# Patient Record
Sex: Male | Born: 1980
Health system: Southern US, Community
[De-identification: ages and names within clinical notes are randomized; demographics above are authoritative.]

## PROBLEM LIST (undated history)

## (undated) DIAGNOSIS — K5792 Diverticulitis of intestine, part unspecified, without perforation or abscess without bleeding: Secondary | ICD-10-CM

## (undated) DIAGNOSIS — I2510 Atherosclerotic heart disease of native coronary artery without angina pectoris: Secondary | ICD-10-CM

## (undated) DIAGNOSIS — M109 Gout, unspecified: Secondary | ICD-10-CM

## (undated) DIAGNOSIS — I219 Acute myocardial infarction, unspecified: Secondary | ICD-10-CM

## (undated) HISTORY — DX: Diverticulitis of intestine, part unspecified, without perforation or abscess without bleeding: K57.92

## (undated) HISTORY — PX: TONSILLECTOMY: SUR1361

## (undated) HISTORY — PX: NO PAST SURGERIES: SHX2092

---

## 2000-08-26 ENCOUNTER — Encounter: Payer: Self-pay | Admitting: Emergency Medicine

## 2000-08-26 ENCOUNTER — Emergency Department (HOSPITAL_COMMUNITY): Admission: EM | Admit: 2000-08-26 | Discharge: 2000-08-26 | Payer: Self-pay | Admitting: Emergency Medicine

## 2000-09-27 ENCOUNTER — Encounter: Payer: Self-pay | Admitting: Emergency Medicine

## 2000-09-27 ENCOUNTER — Emergency Department (HOSPITAL_COMMUNITY): Admission: EM | Admit: 2000-09-27 | Discharge: 2000-09-28 | Payer: Self-pay | Admitting: Emergency Medicine

## 2001-06-24 ENCOUNTER — Emergency Department (HOSPITAL_COMMUNITY): Admission: EM | Admit: 2001-06-24 | Discharge: 2001-06-24 | Payer: Self-pay | Admitting: Emergency Medicine

## 2001-11-21 ENCOUNTER — Encounter: Payer: Self-pay | Admitting: *Deleted

## 2001-11-21 ENCOUNTER — Emergency Department (HOSPITAL_COMMUNITY): Admission: EM | Admit: 2001-11-21 | Discharge: 2001-11-21 | Payer: Self-pay | Admitting: *Deleted

## 2005-08-22 ENCOUNTER — Encounter: Admission: RE | Admit: 2005-08-22 | Discharge: 2005-08-22 | Payer: Self-pay | Admitting: Occupational Medicine

## 2006-01-26 ENCOUNTER — Emergency Department (HOSPITAL_COMMUNITY): Admission: EM | Admit: 2006-01-26 | Discharge: 2006-01-26 | Payer: Self-pay | Admitting: Emergency Medicine

## 2006-02-28 ENCOUNTER — Emergency Department (HOSPITAL_COMMUNITY): Admission: EM | Admit: 2006-02-28 | Discharge: 2006-02-28 | Payer: Self-pay | Admitting: Emergency Medicine

## 2006-04-06 ENCOUNTER — Emergency Department (HOSPITAL_COMMUNITY): Admission: EM | Admit: 2006-04-06 | Discharge: 2006-04-06 | Payer: Self-pay | Admitting: Emergency Medicine

## 2006-06-05 ENCOUNTER — Emergency Department (HOSPITAL_COMMUNITY): Admission: EM | Admit: 2006-06-05 | Discharge: 2006-06-05 | Payer: Self-pay | Admitting: Emergency Medicine

## 2008-04-30 ENCOUNTER — Emergency Department (HOSPITAL_COMMUNITY): Admission: EM | Admit: 2008-04-30 | Discharge: 2008-04-30 | Payer: Self-pay | Admitting: Emergency Medicine

## 2010-02-28 ENCOUNTER — Emergency Department (HOSPITAL_COMMUNITY)
Admission: EM | Admit: 2010-02-28 | Discharge: 2010-03-02 | Disposition: A | Discharge status: Home / Self Care | Payer: Self-pay | Attending: Emergency Medicine | Admitting: Emergency Medicine

## 2010-02-28 ENCOUNTER — Emergency Department (HOSPITAL_COMMUNITY)
Admission: EM | Admit: 2010-02-28 | Discharge: 2010-02-28 | Payer: Self-pay | Source: Home / Self Care | Admitting: Emergency Medicine

## 2010-02-28 DIAGNOSIS — R0789 Other chest pain: Secondary | ICD-10-CM

## 2010-02-28 DIAGNOSIS — R079 Chest pain, unspecified: Secondary | ICD-10-CM | POA: Insufficient documentation

## 2010-02-28 DIAGNOSIS — R071 Chest pain on breathing: Secondary | ICD-10-CM | POA: Insufficient documentation

## 2010-02-28 LAB — DIFFERENTIAL
Basophils Absolute: 0 10*3/uL (ref 0.0–0.1)
Basophils Relative: 0 % (ref 0–1)
Eosinophils Absolute: 0 10*3/uL (ref 0.0–0.7)
Eosinophils Relative: 1 % (ref 0–5)
Lymphocytes Relative: 32 % (ref 12–46)
Lymphs Abs: 2.5 10*3/uL (ref 0.7–4.0)
Monocytes Absolute: 0.8 10*3/uL (ref 0.1–1.0)
Monocytes Relative: 11 % (ref 3–12)
Neutro Abs: 4.5 10*3/uL (ref 1.7–7.7)
Neutrophils Relative %: 57 % (ref 43–77)

## 2010-02-28 LAB — POCT CARDIAC MARKERS
CKMB, poc: 1.9 ng/mL (ref 1.0–8.0)
CKMB, poc: 1.7 ng/mL (ref 1.0–8.0)
Troponin i, poc: <0.05 ng/mL (ref 0.00–0.09)
Troponin i, poc: <0.05 ng/mL (ref 0.00–0.09)

## 2010-02-28 LAB — BASIC METABOLIC PANEL
BUN: 13 mg/dL (ref 6–23)
Calcium: 9.5 mg/dL (ref 8.4–10.5)
Chloride: 102 mEq/L (ref 96–112)
GFR calc Af Amer: >60 mL/min (ref >60)
Glucose, Bld: 109 mg/dL — ABNORMAL HIGH (ref 70–99)

## 2010-02-28 LAB — CBC
Hemoglobin: 13 g/dL (ref 13.0–17.0)
MCH: 26.4 pg (ref 26.0–34.0)
MCV: 81.9 fL (ref 78.0–100.0)
RBC: 4.93 MIL/uL (ref 4.22–5.81)

## 2010-03-01 ENCOUNTER — Emergency Department (HOSPITAL_COMMUNITY): Admit: 2010-03-01 | Discharge: 2010-03-01 | Disposition: A | Discharge status: Home / Self Care | Payer: Self-pay

## 2010-03-01 LAB — POCT CARDIAC MARKERS
CKMB, poc: 1.3 ng/mL (ref 1.0–8.0)
Myoglobin, poc: 131 ng/mL (ref 12–200)
Troponin i, poc: <0.05 ng/mL (ref 0.00–0.09)

## 2010-06-16 NOTE — Procedures (Signed)
[facility]  Patient:    Leonard Pierce, Leonard Pierce Visit Number: [id number] MRN: [id number]          Service Type: EMS Location: ED Attending Physician:  Herbert Seta Dictated by:   Kari Baars, M.D. Proc. Date: 06/24/01 Admit Date:  06/24/2001 Discharge Date: 06/24/2001                            EKG Interpretations  TIME:  1740, Jun 24, 2001  The rhythm is sinus rhythm with a rate at 40s.  There is left ventricular hypertrophy.  IMPRESSION:  Abnormal electrocardiogram. Dictated by:   Kari Baars, M.D. Attending Physician:  Herbert Seta DD:  08/19/01 TD:  08/23/01 Job: [id number]

## 2010-09-22 ENCOUNTER — Emergency Department: Payer: Self-pay | Admitting: Emergency Medicine

## 2010-09-24 ENCOUNTER — Emergency Department: Payer: Self-pay | Admitting: Internal Medicine

## 2010-09-28 ENCOUNTER — Emergency Department: Payer: Self-pay | Admitting: Emergency Medicine

## 2011-07-20 DIAGNOSIS — R079 Chest pain, unspecified: Secondary | ICD-10-CM

## 2012-02-28 DIAGNOSIS — R1084 Generalized abdominal pain: Secondary | ICD-10-CM | POA: Insufficient documentation

## 2012-02-28 DIAGNOSIS — R05 Cough: Secondary | ICD-10-CM | POA: Insufficient documentation

## 2012-02-28 DIAGNOSIS — R059 Cough, unspecified: Secondary | ICD-10-CM | POA: Insufficient documentation

## 2012-02-28 DIAGNOSIS — R197 Diarrhea, unspecified: Secondary | ICD-10-CM | POA: Insufficient documentation

## 2012-02-28 DIAGNOSIS — K5289 Other specified noninfective gastroenteritis and colitis: Secondary | ICD-10-CM | POA: Insufficient documentation

## 2012-02-29 ENCOUNTER — Encounter (HOSPITAL_COMMUNITY): Payer: Self-pay | Admitting: Emergency Medicine

## 2012-02-29 ENCOUNTER — Emergency Department (HOSPITAL_COMMUNITY)
Admission: EM | Admit: 2012-02-29 | Discharge: 2012-02-29 | Disposition: A | Discharge status: Home / Self Care | Payer: PRIVATE HEALTH INSURANCE | Attending: Emergency Medicine | Admitting: Emergency Medicine

## 2012-02-29 DIAGNOSIS — K529 Noninfective gastroenteritis and colitis, unspecified: Secondary | ICD-10-CM

## 2012-02-29 LAB — COMPREHENSIVE METABOLIC PANEL
Alkaline Phosphatase: 65 U/L (ref 39–117)
BUN: 14 mg/dL (ref 6–23)
Calcium: 9.9 mg/dL (ref 8.4–10.5)
Creatinine, Ser: 1.02 mg/dL (ref 0.50–1.35)
GFR calc Af Amer: >90 mL/min (ref >90)
Glucose, Bld: 99 mg/dL (ref 70–99)
Total Protein: 8.3 g/dL (ref 6.0–8.3)

## 2012-02-29 LAB — CBC WITH DIFFERENTIAL/PLATELET
Eosinophils Absolute: 0.1 10*3/uL (ref 0.0–0.7)
Eosinophils Relative: 1 % (ref 0–5)
Hemoglobin: 14.1 g/dL (ref 13.0–17.0)
Lymphs Abs: 2.2 10*3/uL (ref 0.7–4.0)
MCH: 27 pg (ref 26.0–34.0)
MCV: 82 fL (ref 78.0–100.0)
Monocytes Absolute: 0.9 10*3/uL (ref 0.1–1.0)
Monocytes Relative: 12 % (ref 3–12)
RBC: 5.22 MIL/uL (ref 4.22–5.81)

## 2012-02-29 LAB — LIPASE, BLOOD: Lipase: 36 U/L (ref 11–59)

## 2012-02-29 MED ORDER — ONDANSETRON HCL 4 MG/2ML IJ SOLN
4.0000 mg | Freq: Once | INTRAMUSCULAR | Status: AC
  Administered 2012-02-29: 4 mg via INTRAVENOUS
  Filled 2012-02-29: qty 2

## 2012-02-29 MED ORDER — SODIUM CHLORIDE 0.9 % IV BOLUS (SEPSIS)
1000.0000 mL | Freq: Once | INTRAVENOUS | Status: AC
  Administered 2012-02-29: 1000 mL via INTRAVENOUS

## 2012-02-29 MED ORDER — ONDANSETRON HCL 4 MG PO TABS: 4.0000 mg | ORAL_TABLET | Freq: Three times a day (TID) | ORAL | Status: DC | PRN

## 2012-02-29 NOTE — ED Notes (Signed)
Patient reporting emesis and diarrhea since 1900, started after eating dinner. Also complaining of dry cough x 1 week.

## 2012-02-29 NOTE — ED Provider Notes (Signed)
History     CSN: [id number]  Arrival date & time 02/28/12  2353   First MD Initiated Contact with Patient 02/29/12 0015      Chief Complaint  Patient presents with  . Emesis  . Diarrhea  . Cough    (Consider location/radiation/quality/duration/timing/severity/associated sxs/prior treatment) HPI Pt reports about 24hrs of multiple episodes of non-bloody non-bilious emesis and watery diarrhea associated with moderate diffuse abdominal cramps. He denies any fever but reports a dry cough for a few days prior to onset of vomiting.   History reviewed. No pertinent past medical history.  History reviewed. No pertinent past surgical history.  History reviewed. No pertinent family history.  History  Substance Use Topics  . Smoking status: Never Smoker   . Smokeless tobacco: Not on file  . Alcohol Use: No      Review of Systems All other systems reviewed and are negative except as noted in HPI.   Allergies  Review of patient's allergies indicates no known allergies.  Home Medications  No current outpatient prescriptions on file.  BP 144/82  Pulse 83  Temp 98.4 F (36.9 C) (Oral)  Resp 18  Ht 5\' 8"  (1.727 m)  Wt 320 lb (145.151 kg)  BMI 48.66 kg/m2  SpO2 97%  Physical Exam  Nursing note and vitals reviewed. Constitutional: He is oriented to person, place, and time. He appears well-developed and well-nourished.  HENT:  Head: Normocephalic and atraumatic.  Eyes: EOM are normal. Pupils are equal, round, and reactive to light.  Neck: Normal range of motion. Neck supple.  Cardiovascular: Normal rate, normal heart sounds and intact distal pulses.   Pulmonary/Chest: Effort normal and breath sounds normal.  Abdominal: Bowel sounds are normal. He exhibits no distension. There is no tenderness.  Musculoskeletal: Normal range of motion. He exhibits no edema and no tenderness.  Neurological: He is alert and oriented to person, place, and time. He has normal strength. No  cranial nerve deficit or sensory deficit.  Skin: Skin is warm and dry. No rash noted.  Psychiatric: He has a normal mood and affect.    ED Course  Procedures (including critical care time)  Labs Reviewed  COMPREHENSIVE METABOLIC PANEL - Abnormal; Notable for the following:    Total Bilirubin 0.2 (*)     All other components within normal limits  CBC WITH DIFFERENTIAL  LIPASE, BLOOD   No results found.   No diagnosis found.    MDM  Labs unremarkable. Pt feeling better and tolerating PO fluid. Likely viral gastroenteritis. Advised clear liquids and advance as tolerated.         Davonna Ertl B. Bernette Mayers, MD 02/29/12 [phone]

## 2012-05-09 ENCOUNTER — Emergency Department (HOSPITAL_COMMUNITY)
Admission: EM | Admit: 2012-05-09 | Discharge: 2012-05-09 | Disposition: A | Discharge status: Home / Self Care | Payer: Self-pay | Attending: Emergency Medicine | Admitting: Emergency Medicine

## 2012-05-09 ENCOUNTER — Emergency Department (HOSPITAL_COMMUNITY): Payer: Self-pay

## 2012-05-09 ENCOUNTER — Encounter (HOSPITAL_COMMUNITY): Payer: Self-pay | Admitting: *Deleted

## 2012-05-09 DIAGNOSIS — W19XXXA Unspecified fall, initial encounter: Secondary | ICD-10-CM

## 2012-05-09 DIAGNOSIS — W1809XA Striking against other object with subsequent fall, initial encounter: Secondary | ICD-10-CM | POA: Insufficient documentation

## 2012-05-09 DIAGNOSIS — S301XXA Contusion of abdominal wall, initial encounter: Secondary | ICD-10-CM | POA: Insufficient documentation

## 2012-05-09 DIAGNOSIS — Y9389 Activity, other specified: Secondary | ICD-10-CM | POA: Insufficient documentation

## 2012-05-09 DIAGNOSIS — Y9289 Other specified places as the place of occurrence of the external cause: Secondary | ICD-10-CM | POA: Insufficient documentation

## 2012-05-09 LAB — POCT I-STAT, CHEM 8
Glucose, Bld: 106 mg/dL — ABNORMAL HIGH (ref 70–99)
HCT: 42 % (ref 39.0–52.0)
Hemoglobin: 14.3 g/dL (ref 13.0–17.0)
Potassium: 3.7 mEq/L (ref 3.5–5.1)

## 2012-05-09 LAB — URINALYSIS, ROUTINE W REFLEX MICROSCOPIC
Bilirubin Urine: NEGATIVE
Nitrite: NEGATIVE
Specific Gravity, Urine: 1.025 (ref 1.005–1.030)
Urobilinogen, UA: 0.2 mg/dL (ref 0.0–1.0)

## 2012-05-09 MED ORDER — SODIUM CHLORIDE 0.9 % IV SOLN
INTRAVENOUS | Status: DC
  Administered 2012-05-09: 1000 mL via INTRAVENOUS

## 2012-05-09 MED ORDER — CYCLOBENZAPRINE HCL 5 MG PO TABS: 5.0000 mg | ORAL_TABLET | Freq: Three times a day (TID) | ORAL | Status: DC | PRN

## 2012-05-09 MED ORDER — IOHEXOL 300 MG/ML  SOLN
120.0000 mL | Freq: Once | INTRAMUSCULAR | Status: AC | PRN
  Administered 2012-05-09: 120 mL via INTRAVENOUS

## 2012-05-09 MED ORDER — MORPHINE SULFATE 4 MG/ML IJ SOLN
4.0000 mg | Freq: Once | INTRAMUSCULAR | Status: AC
  Administered 2012-05-09: 4 mg via INTRAVENOUS
  Filled 2012-05-09: qty 1

## 2012-05-09 MED ORDER — HYDROCODONE-ACETAMINOPHEN 5-325 MG PO TABS: ORAL_TABLET | ORAL | Status: DC

## 2012-05-09 MED ORDER — ONDANSETRON HCL 4 MG/2ML IJ SOLN
4.0000 mg | Freq: Once | INTRAMUSCULAR | Status: AC
  Administered 2012-05-09: 4 mg via INTRAVENOUS
  Filled 2012-05-09: qty 2

## 2012-05-09 NOTE — ED Notes (Signed)
Pt states was moving furniture yesterday & now having back pain in his lower right back. Denies any urinary problems. Hurts more w/ certain movements.

## 2012-05-09 NOTE — ED Notes (Signed)
Pt states was moving furniture yesterday & now having lower back pain.

## 2012-05-09 NOTE — ED Provider Notes (Signed)
History     CSN: [id number]  Arrival date & time 05/09/12  0543   First MD Initiated Contact with Patient 05/09/12 0557      Chief Complaint  Patient presents with  . Back Pain    (Consider location/radiation/quality/duration/timing/severity/associated sxs/prior treatment) HPI  Patient states he was helping his employer move yesterday. Patient had a large dresser on a dolly and was taking it up some stairs when the patient slipped and fell backwards onto cement steps and relates the dresser on the dolly fell and hit him on his right side. He states he had immediate stinging pain that got a little bit better. However he states he was was awakened from sleep at 2:30 this morning with worsening pain. In his right abdomen/back.  He states if he moves he gets a sharp "catching" pain however he has a constant achy pain also that is worse than he states bending his right leg makes the pain feel better. He states any movement or bending makes the pain feel worse. He denies any gross hematuria, nausea, vomiting or pleuritic pain. The pain does not radiate into his extremities. He states he's never had this before.  History reviewed. No pertinent past medical history.  History reviewed. No pertinent past surgical history.  No family history on file.  History  Substance Use Topics  . Smoking status: Never Smoker   . Smokeless tobacco: Not on file  . Alcohol Use: No   Employed as heavy Arboriculturist   Review of Systems  All other systems reviewed and are negative.    Allergies  Review of patient's allergies indicates no known allergies.  Home Medications   Current Outpatient Rx  Name  Route  Sig  Dispense  Refill  . ondansetron (ZOFRAN) 4 MG tablet   Oral   Take 1 tablet (4 mg total) by mouth every 8 (eight) hours as needed for nausea.   12 tablet   0     BP 129/76  Pulse 78  Temp(Src) 98.1 F (36.7 C) (Oral)  Resp 20  Ht 5\' 8"  (1.727 m)  Wt 320 lb (145.151 kg)   BMI 48.67 kg/m2  SpO2 95%  Vital signs normal    Physical Exam  Nursing note and vitals reviewed. Constitutional: He is oriented to person, place, and time. He appears well-developed and well-nourished.  Non-toxic appearance. He does not appear ill. No distress.  HENT:  Head: Normocephalic and atraumatic.  Right Ear: External ear normal.  Left Ear: External ear normal.  Nose: Nose normal. No mucosal edema or rhinorrhea.  Mouth/Throat: Oropharynx is clear and moist and mucous membranes are normal. No dental abscesses or edematous.  Eyes: Conjunctivae and EOM are normal. Pupils are equal, round, and reactive to light.  Neck: Normal range of motion and full passive range of motion without pain. Neck supple.  Cardiovascular: Normal rate, regular rhythm and normal heart sounds.  Exam reveals no gallop and no friction rub.   No murmur heard. Pulmonary/Chest: Effort normal and breath sounds normal. No respiratory distress. He has no wheezes. He has no rhonchi. He has no rales. He exhibits no tenderness and no crepitus.  Abdominal: Soft. Normal appearance and bowel sounds are normal. He exhibits no distension. There is no tenderness. There is no rebound and no guarding.  Musculoskeletal: Normal range of motion. He exhibits no edema and no tenderness.  Moves all extremities well.  Has mild tenderness in his lumbar spine midline to palpation,but is very tender in  his right flank area. No bruising or abrasion noted. Appears painful when changes position.   Neurological: He is alert and oriented to person, place, and time. He has normal strength. No cranial nerve deficit.  Skin: Skin is warm, dry and intact. No rash noted. No erythema. No pallor.  Psychiatric: He has a normal mood and affect. His speech is normal and behavior is normal. His mood appears not anxious.    ED Course  Procedures (including critical care time)  Medications  0.9 %  sodium chloride infusion (1,000 mLs Intravenous New  Bag/Given 05/09/12 0625)  morphine 4 MG/ML injection 4 mg (4 mg Intravenous Given 05/09/12 0629)  ondansetron (ZOFRAN) injection 4 mg (4 mg Intravenous Given 05/09/12 0626)  iohexol (OMNIPAQUE) 300 MG/ML solution 120 mL (120 mLs Intravenous Contrast Given 05/09/12 0710)    Results for orders placed during the hospital encounter of 05/09/12  URINALYSIS, ROUTINE W REFLEX MICROSCOPIC      Result Value Range   Color, Urine YELLOW  YELLOW   APPearance CLEAR  CLEAR   Specific Gravity, Urine 1.025  1.005 - 1.030   pH 6.0  5.0 - 8.0   Glucose, UA NEGATIVE  NEGATIVE mg/dL   Hgb urine dipstick NEGATIVE  NEGATIVE   Bilirubin Urine NEGATIVE  NEGATIVE   Ketones, ur NEGATIVE  NEGATIVE mg/dL   Protein, ur NEGATIVE  NEGATIVE mg/dL   Urobilinogen, UA 0.2  0.0 - 1.0 mg/dL   Nitrite NEGATIVE  NEGATIVE   Leukocytes, UA NEGATIVE  NEGATIVE  POCT I-STAT, CHEM 8      Result Value Range   Sodium 140  135 - 145 mEq/L   Potassium 3.7  3.5 - 5.1 mEq/L   Chloride 104  96 - 112 mEq/L   BUN 10  6 - 23 mg/dL   Creatinine, Ser 4.09  0.50 - 1.35 mg/dL   Glucose, Bld 811 (*) 70 - 99 mg/dL   Calcium, Ion 9.14  1.12 - 1.23 mmol/L   TCO2 28  0 - 100 mmol/L   Hemoglobin 14.3  13.0 - 17.0 g/dL   HCT 78.2  95.6 - 21.3 %   Laboratory interpretation all normal  Ct Abdomen Pelvis W Contrast  05/09/2012  *RADIOLOGY REPORT*  Clinical Data: Back pain after a fall.  Right flank pain.  CT ABDOMEN AND PELVIS WITH CONTRAST  Technique:  Multidetector CT imaging of the abdomen and pelvis was performed following the standard protocol during bolus administration of intravenous contrast.  Contrast: OMNIPAQUE IOHEXOL 300 MG/ML  SOLN  Comparison: None.  Findings: Lung bases show minimal dependent atelectasis.  Heart size at the upper limits of normal.  No pericardial or pleural effusion.  Liver is decreased in attenuation diffusely.  Liver, gallbladder, adrenal glands, kidneys, spleen, pancreas, stomach and bowel are otherwise  unremarkable.  There is haziness in the small bowel mesentery with nodules measuring up to 14 mm (image 44).  No pathologically enlarged lymph nodes.  Circumaortic left renal vein. No free fluid.  No worrisome lytic or sclerotic lesions.  No fracture.  A well- circumscribed sclerotic lesion in the right eighth lateral rib has a benign appearance.  IMPRESSION:  1.  No evidence of acute trauma. 2.  Hepatic steatosis. 3.  Haziness and nodularity in the small bowel mesentery is nonspecific.   Original Report Authenticated By: Leanna Battles, M.D.        Results for orders placed during the hospital encounter of 02/29/12  CBC WITH DIFFERENTIAL  Result Value Range   WBC 7.2  4.0 - 10.5 K/uL   RBC 5.22  4.22 - 5.81 MIL/uL   Hemoglobin 14.1  13.0 - 17.0 g/dL   HCT 16.1  09.6 - 04.5 %   MCV 82.0  78.0 - 100.0 fL   MCH 27.0  26.0 - 34.0 pg   MCHC 32.9  30.0 - 36.0 g/dL   RDW 40.9  81.1 - 91.4 %   Platelets 249  150 - 400 K/uL   Neutrophils Relative 56  43 - 77 %   Neutro Abs 4.0  1.7 - 7.7 K/uL   Lymphocytes Relative 31  12 - 46 %   Lymphs Abs 2.2  0.7 - 4.0 K/uL   Monocytes Relative 12  3 - 12 %   Monocytes Absolute 0.9  0.1 - 1.0 K/uL   Eosinophils Relative 1  0 - 5 %   Eosinophils Absolute 0.1  0.0 - 0.7 K/uL   Basophils Relative 0  0 - 1 %   Basophils Absolute 0.0  0.0 - 0.1 K/uL  COMPREHENSIVE METABOLIC PANEL      Result Value Range   Sodium 138  135 - 145 mEq/L   Potassium 3.7  3.5 - 5.1 mEq/L   Chloride 101  96 - 112 mEq/L   CO2 28  19 - 32 mEq/L   Glucose, Bld 99  70 - 99 mg/dL   BUN 14  6 - 23 mg/dL   Creatinine, Ser 7.82  0.50 - 1.35 mg/dL   Calcium 9.9  8.4 - 95.6 mg/dL   Total Protein 8.3  6.0 - 8.3 g/dL   Albumin 4.0  3.5 - 5.2 g/dL   AST 21  0 - 37 U/L   ALT 22  0 - 53 U/L   Alkaline Phosphatase 65  39 - 117 U/L   Total Bilirubin 0.2 (*) 0.3 - 1.2 mg/dL   GFR calc non Af Amer >90  >90 mL/min   GFR calc Af Amer >90  >90 mL/min  LIPASE, BLOOD      Result Value  Range   Lipase 36  11 - 59 U/L      1. Fall, initial encounter   2. Contusion, flank, initial encounter     New Prescriptions   CYCLOBENZAPRINE (FLEXERIL) 5 MG TABLET    Take 1 tablet (5 mg total) by mouth 3 (three) times daily as needed (muscle pain).   HYDROCODONE-ACETAMINOPHEN (NORCO/VICODIN) 5-325 MG PER TABLET    Take 1 or 2 po Q 6hrs for pain    Plan discharge  Devoria Albe, MD, FACEP   MDM          Ward Givens, MD 05/09/12 [phone]

## 2012-05-14 ENCOUNTER — Ambulatory Visit: Payer: Self-pay | Admitting: Family Medicine

## 2012-05-14 LAB — RAPID INFLUENZA A&B ANTIGENS

## 2012-06-17 ENCOUNTER — Encounter (HOSPITAL_COMMUNITY): Payer: Self-pay | Admitting: *Deleted

## 2012-06-17 ENCOUNTER — Emergency Department (HOSPITAL_COMMUNITY)
Admission: EM | Admit: 2012-06-17 | Discharge: 2012-06-17 | Disposition: A | Discharge status: Home / Self Care | Payer: Self-pay | Attending: Emergency Medicine | Admitting: Emergency Medicine

## 2012-06-17 DIAGNOSIS — Y9364 Activity, baseball: Secondary | ICD-10-CM | POA: Insufficient documentation

## 2012-06-17 DIAGNOSIS — X500XXA Overexertion from strenuous movement or load, initial encounter: Secondary | ICD-10-CM | POA: Insufficient documentation

## 2012-06-17 DIAGNOSIS — Y9239 Other specified sports and athletic area as the place of occurrence of the external cause: Secondary | ICD-10-CM | POA: Insufficient documentation

## 2012-06-17 DIAGNOSIS — Y929 Unspecified place or not applicable: Secondary | ICD-10-CM | POA: Insufficient documentation

## 2012-06-17 DIAGNOSIS — S335XXA Sprain of ligaments of lumbar spine, initial encounter: Secondary | ICD-10-CM | POA: Insufficient documentation

## 2012-06-17 DIAGNOSIS — S39012A Strain of muscle, fascia and tendon of lower back, initial encounter: Secondary | ICD-10-CM

## 2012-06-17 MED ORDER — CYCLOBENZAPRINE HCL 10 MG PO TABS: 10.0000 mg | ORAL_TABLET | Freq: Two times a day (BID) | ORAL | Status: DC | PRN

## 2012-06-17 MED ORDER — NAPROXEN 500 MG PO TABS: 500.0000 mg | ORAL_TABLET | Freq: Two times a day (BID) | ORAL | Status: DC

## 2012-06-17 MED ORDER — TRAMADOL HCL 50 MG PO TABS: 50.0000 mg | ORAL_TABLET | Freq: Four times a day (QID) | ORAL | Status: DC | PRN

## 2012-06-17 NOTE — ED Provider Notes (Signed)
History     This chart was scribed for Gilda Crease, *, MD by Smitty Pluck, ED Scribe. The patient was seen in room APA08/APA08 and the patient's care was started at 9:57 AM.   CSN: [id number]  Arrival date & time 06/17/12  0946     No chief complaint on file.   The history is provided by the patient and medical records. No language interpreter was used.   HPI Comments: Leonard Pierce is a 32 y.o. male who presents to the Emergency Department complaining of constant, moderate right lower back pain radiating to right thigh onset 1 day ago. He states it suddenly started while playing basketball.  Pt denies numbness, urinary/bowel incontinence, fever, chills, nausea, vomiting, diarrhea, weakness, cough, SOB and any other pain.   No past medical history on file.  No past surgical history on file.  No family history on file.  History  Substance Use Topics  . Smoking status: Never Smoker   . Smokeless tobacco: Not on file  . Alcohol Use: No      Review of Systems  Genitourinary: Negative for dysuria.  Musculoskeletal: Positive for back pain.  All other systems reviewed and are negative.    Allergies  Review of patient's allergies indicates no known allergies.  Home Medications   Current Outpatient Rx  Name  Route  Sig  Dispense  Refill  . cyclobenzaprine (FLEXERIL) 5 MG tablet   Oral   Take 1 tablet (5 mg total) by mouth 3 (three) times daily as needed (muscle pain).   30 tablet   0   . HYDROcodone-acetaminophen (NORCO/VICODIN) 5-325 MG per tablet      Take 1 or 2 po Q 6hrs for pain   15 tablet   0   . ondansetron (ZOFRAN) 4 MG tablet   Oral   Take 1 tablet (4 mg total) by mouth every 8 (eight) hours as needed for nausea.   12 tablet   0     BP 134/89  Pulse 58  Temp(Src) 97.5 F (36.4 C) (Oral)  Resp 16  SpO2 97%  Physical Exam  Nursing note and vitals reviewed. Constitutional: He is oriented to person, place, and time. He appears  well-developed and well-nourished. No distress.  HENT:  Head: Normocephalic and atraumatic.  Right Ear: Hearing normal.  Left Ear: Hearing normal.  Nose: Nose normal.  Mouth/Throat: Oropharynx is clear and moist and mucous membranes are normal.  Eyes: Conjunctivae and EOM are normal. Pupils are equal, round, and reactive to light.  Neck: Normal range of motion. Neck supple.  Cardiovascular: Regular rhythm, S1 normal and S2 normal.  Exam reveals no gallop and no friction rub.   No murmur heard. Pulmonary/Chest: Effort normal and breath sounds normal. No respiratory distress. He exhibits no tenderness.  Abdominal: Soft. Normal appearance and bowel sounds are normal. There is no hepatosplenomegaly. There is no tenderness. There is no rebound, no guarding, no tenderness at McBurney's point and negative Murphy's sign. No hernia.  Musculoskeletal: Normal range of motion.       Lumbar back: He exhibits normal range of motion and no bony tenderness.       Arms: right para-lumbar tenderness No midline tenderness  Neurological: He is alert and oriented to person, place, and time. He has normal strength. No cranial nerve deficit or sensory deficit. Coordination normal. GCS eye subscore is 4. GCS verbal subscore is 5. GCS motor subscore is 6.  Reflex Scores:  Patellar reflexes are 2+ on the right side and 2+ on the left side. Skin: Skin is warm, dry and intact. No rash noted. No cyanosis.  Psychiatric: He has a normal mood and affect. His speech is normal and behavior is normal. Thought content normal.    ED Course  Procedures (including critical care time) DIAGNOSTIC STUDIES: Oxygen Saturation is 97% on room air, normal by my interpretation.    COORDINATION OF CARE: 9:58 AM Discussed ED treatment with pt and pt agrees.     Labs Reviewed - No data to display No results found.   Diagnosis: Lumbar strain    MDM  Patient presents to the ER with musculoskeletal back pain. Examination  reveals back tenderness without any associated neurologic findings. Patient's strength, sensation and reflexes were normal. As such, patient did not require any imaging or further studies. Patient was treated with analgesia.  I personally performed the services described in this documentation, which was scribed in my presence. The recorded information has been reviewed and is accurate.      Gilda Crease, MD 06/17/12 1010

## 2012-06-17 NOTE — ED Notes (Signed)
Pt states right mid back pain after swinging the bat during baseball yesterday. States he was unable to sleep or go in to work last night.

## 2012-12-15 ENCOUNTER — Emergency Department (HOSPITAL_COMMUNITY): Payer: Self-pay

## 2012-12-15 ENCOUNTER — Emergency Department (HOSPITAL_COMMUNITY)
Admission: EM | Admit: 2012-12-15 | Discharge: 2012-12-15 | Disposition: A | Discharge status: Home / Self Care | Payer: Self-pay | Attending: Emergency Medicine | Admitting: Emergency Medicine

## 2012-12-15 ENCOUNTER — Encounter (HOSPITAL_COMMUNITY): Payer: Self-pay | Admitting: Emergency Medicine

## 2012-12-15 DIAGNOSIS — M25519 Pain in unspecified shoulder: Secondary | ICD-10-CM | POA: Insufficient documentation

## 2012-12-15 DIAGNOSIS — R52 Pain, unspecified: Secondary | ICD-10-CM | POA: Insufficient documentation

## 2012-12-15 DIAGNOSIS — Z791 Long term (current) use of non-steroidal anti-inflammatories (NSAID): Secondary | ICD-10-CM | POA: Insufficient documentation

## 2012-12-15 DIAGNOSIS — IMO0002 Reserved for concepts with insufficient information to code with codable children: Secondary | ICD-10-CM | POA: Insufficient documentation

## 2012-12-15 DIAGNOSIS — G8929 Other chronic pain: Secondary | ICD-10-CM | POA: Insufficient documentation

## 2012-12-15 DIAGNOSIS — L210 Seborrhea capitis: Secondary | ICD-10-CM | POA: Insufficient documentation

## 2012-12-15 DIAGNOSIS — M25512 Pain in left shoulder: Secondary | ICD-10-CM

## 2012-12-15 DIAGNOSIS — Z79899 Other long term (current) drug therapy: Secondary | ICD-10-CM | POA: Insufficient documentation

## 2012-12-15 MED ORDER — TRIAMCINOLONE ACETONIDE 0.1 % EX CREA: 1.0000 "application " | TOPICAL_CREAM | Freq: Three times a day (TID) | CUTANEOUS | Status: DC

## 2012-12-15 MED ORDER — TRAMADOL HCL 50 MG PO TABS: 50.0000 mg | ORAL_TABLET | Freq: Four times a day (QID) | ORAL | Status: DC | PRN

## 2012-12-15 MED ORDER — DICLOFENAC SODIUM 75 MG PO TBEC: 75.0000 mg | DELAYED_RELEASE_TABLET | Freq: Two times a day (BID) | ORAL | Status: DC

## 2012-12-15 NOTE — ED Notes (Signed)
Pt states old shoulder injury to left should that has flared up. Also states rash to chest began 2 days ago. NAD

## 2012-12-15 NOTE — ED Notes (Signed)
Pain lt shoulder, "I think I hurt it playing highschool baseball. I tore up my rotator cuff".  Also rash to lt ant chest that itches. Alert, NAD.

## 2012-12-16 NOTE — ED Provider Notes (Signed)
CSN: [id number]     Arrival date & time 12/15/12  1130 History   First MD Initiated Contact with Patient 12/15/12 1136     Chief Complaint  Patient presents with  . Shoulder Pain  . Rash   (Consider location/radiation/quality/duration/timing/severity/associated sxs/prior Treatment) Patient is a 32 y.o. male presenting with shoulder pain and rash. The history is provided by the patient.  Shoulder Pain This is a recurrent problem. Episode onset: 3 days. The problem occurs constantly. The problem has been unchanged. Associated symptoms include arthralgias and a rash. Pertinent negatives include no abdominal pain, chest pain, chills, coughing, fever, headaches, joint swelling, nausea, neck pain, numbness, sore throat, vomiting or weakness. Exacerbated by: movement of the left arm. He has tried NSAIDs for the symptoms. The treatment provided moderate relief.  Rash Location:  Torso Torso rash location:  L chest and R chest Quality: itchiness and scaling   Quality: not blistering, not burning, not draining, not painful, not red and not swelling   Severity:  Mild Onset quality:  Gradual Duration:  2 days Timing:  Constant Progression:  Unchanged Chronicity:  New Context: not animal contact, not exposure to similar rash, not insect bite/sting, not medications, not new detergent/soap, not plant contact, not sick contacts and not sun exposure   Relieved by:  Nothing Worsened by:  Nothing tried Ineffective treatments:  None tried Associated symptoms: joint pain   Associated symptoms: no abdominal pain, no fever, no headaches, no induration, no nausea, no periorbital edema, no shortness of breath, no sore throat, no throat swelling, no tongue swelling, no URI, not vomiting and not wheezing     History reviewed. No pertinent past medical history. History reviewed. No pertinent past surgical history. No family history on file. History  Substance Use Topics  . Smoking status: Never Smoker   .  Smokeless tobacco: Not on file  . Alcohol Use: No    Review of Systems  Constitutional: Negative for fever, chills, activity change and appetite change.  HENT: Negative for facial swelling, sore throat and trouble swallowing.   Respiratory: Negative for cough, chest tightness, shortness of breath and wheezing.   Cardiovascular: Negative for chest pain.  Gastrointestinal: Negative for nausea, vomiting and abdominal pain.  Genitourinary: Negative for dysuria and difficulty urinating.  Musculoskeletal: Positive for arthralgias. Negative for joint swelling, neck pain and neck stiffness.       Left shoulder pain  Skin: Positive for rash. Negative for color change and wound.       Rash across upper chest  Neurological: Negative for dizziness, weakness, numbness and headaches.  All other systems reviewed and are negative.    Allergies  Review of patient's allergies indicates no known allergies.  Home Medications   Current Outpatient Rx  Name  Route  Sig  Dispense  Refill  . cyclobenzaprine (FLEXERIL) 10 MG tablet   Oral   Take 1 tablet (10 mg total) by mouth 2 (two) times daily as needed for muscle spasms.   20 tablet   0   . diclofenac (VOLTAREN) 75 MG EC tablet   Oral   Take 1 tablet (75 mg total) by mouth 2 (two) times daily. Take with food   14 tablet   0   . naproxen (NAPROSYN) 500 MG tablet   Oral   Take 1 tablet (500 mg total) by mouth 2 (two) times daily.   30 tablet   0   . traMADol (ULTRAM) 50 MG tablet   Oral   Take  1 tablet (50 mg total) by mouth every 6 (six) hours as needed for pain.   15 tablet   0   . traMADol (ULTRAM) 50 MG tablet   Oral   Take 1 tablet (50 mg total) by mouth every 6 (six) hours as needed.   15 tablet   0   . triamcinolone cream (KENALOG) 0.1 %   Topical   Apply 1 application topically 3 (three) times daily.   15 g   0    BP 154/82  Pulse 97  Temp(Src) 98.1 F (36.7 C)  Resp 19  Ht 5\' 10"  (1.778 m)  Wt 315 lb (142.883  kg)  BMI 45.20 kg/m2  SpO2 100% Physical Exam  Nursing note and vitals reviewed. Constitutional: He is oriented to person, place, and time. He appears well-developed and well-nourished. No distress.  HENT:  Head: Normocephalic and atraumatic.  Neck: Normal range of motion. Neck supple. No thyromegaly present.  Cardiovascular: Normal rate, regular rhythm, normal heart sounds and intact distal pulses.   No murmur heard. Pulmonary/Chest: Effort normal and breath sounds normal. No respiratory distress. He exhibits no tenderness.  Abdominal: Soft. He exhibits no distension. There is no tenderness. There is no rebound.  Musculoskeletal: He exhibits tenderness. He exhibits no edema.  ttp of the anterior left shoulder.  Pain with abduction of the left arm and rotation of the shoulder.  Radial pulse is brisk, distal sensation intact, CR< 2 sec. Grip strength is strong and symmetrical.   No abrasions, edema , erythema or step-off deformity of the joint.   Lymphadenopathy:    He has no cervical adenopathy.  Neurological: He is alert and oriented to person, place, and time. He has normal strength. No sensory deficit. He exhibits normal muscle tone. Coordination normal.  Skin: Skin is warm and dry. Rash noted.  Few macular , scaly hyperpigmented lesions to the upper chest.  No erythema, vesicles, pustules or edema.    ED Course  Procedures (including critical care time) Labs Review Labs Reviewed - No data to display Imaging Review Dg Shoulder Left  12/15/2012   CLINICAL DATA:  Pain  EXAM: LEFT SHOULDER - 2+ VIEW  COMPARISON:  None.  FINDINGS: There is no evidence of fracture or dislocation. There is no evidence of arthropathy or other focal bone abnormality. Soft tissues are unremarkable.  IMPRESSION: Negative.   Electronically Signed   By: Salome Holmes M.D.   On: 12/15/2012 12:35    EKG Interpretation   None       MDM   1. Shoulder pain, left   2. Pityriasis    Patient with left  shoulder pain that's acute on chronic.  No concerning sx's for septic joint.  NV intact. C spine is NT.  No focal neuro deficits.  I will provide symptomatic tx with ultram#15, diclofenac, and kenalog cream for itching.  Rash appears c/w pityriasis.    VSS.  Pt appears stable for d/c    Mackinley Kiehn L. Trisha Mangle, PA-C 12/16/12 2108

## 2012-12-18 NOTE — ED Provider Notes (Signed)
Medical screening examination/treatment/procedure(s) were performed by non-physician practitioner and as supervising physician I was immediately available for consultation/collaboration.  EKG Interpretation   None         Christophr Calix W Lauran Romanski, MD 12/18/12 1603 

## 2013-01-29 ENCOUNTER — Encounter (HOSPITAL_COMMUNITY): Payer: Self-pay | Admitting: Emergency Medicine

## 2013-01-29 ENCOUNTER — Emergency Department (HOSPITAL_COMMUNITY): Payer: Self-pay

## 2013-01-29 ENCOUNTER — Emergency Department (HOSPITAL_COMMUNITY)
Admission: EM | Admit: 2013-01-29 | Discharge: 2013-01-29 | Disposition: A | Discharge status: Home / Self Care | Payer: Self-pay | Attending: Emergency Medicine | Admitting: Emergency Medicine

## 2013-01-29 DIAGNOSIS — IMO0002 Reserved for concepts with insufficient information to code with codable children: Secondary | ICD-10-CM | POA: Insufficient documentation

## 2013-01-29 DIAGNOSIS — Z791 Long term (current) use of non-steroidal anti-inflammatories (NSAID): Secondary | ICD-10-CM | POA: Insufficient documentation

## 2013-01-29 DIAGNOSIS — M109 Gout, unspecified: Secondary | ICD-10-CM | POA: Insufficient documentation

## 2013-01-29 MED ORDER — INDOMETHACIN 50 MG PO CAPS: 50.0000 mg | ORAL_CAPSULE | Freq: Three times a day (TID) | ORAL | Status: DC | PRN

## 2013-01-29 MED ORDER — HYDROCODONE-ACETAMINOPHEN 5-325 MG PO TABS: 1.0000 | ORAL_TABLET | ORAL | Status: DC | PRN

## 2013-01-29 MED ORDER — HYDROCODONE-ACETAMINOPHEN 5-325 MG PO TABS
2.0000 | ORAL_TABLET | Freq: Once | ORAL | Status: AC
  Administered 2013-01-29: 2 via ORAL
  Filled 2013-01-29: qty 2

## 2013-01-29 NOTE — ED Provider Notes (Signed)
CSN: [id number]     Arrival date & time 01/29/13  1150 History   First MD Initiated Contact with Patient 01/29/13 1158 This chart was scribed for non-physician practitioner Trixie DredgeEmily Lloyd Ayo, PA-C working with Donnetta HutchingBrian Cook, MD by Valera CastleSteven Perry, ED scribe. This patient was seen in room WTR6/WTR6 and the patient's care was started at 12:27 PM.     No chief complaint on file.   The history is provided by the patient. No language interpreter was used.   HPI Comments: Leonard CampionJason A Pierce is a 33 y.o. male who presents to the Emergency Department complaining of sudden gout beneath his right great toe, with associated constant pain, severity of 7-8/10, onset 1 week ago. He reports coming in today due to the increased pain. He reports h/o gout over his right great toe, and states he has a flare of gout every few years. He reports being able to bend his toe. He reports drinking apple cider and cherry juice, without relief. He denies fever, chills, body aches, extremity pain, and any other associated symptoms. He denies any allergies. Denies any injury to the area.  States this feels exactly like his prior gout flare.   PCP - No PCP Per Patient  No past medical history on file. No past surgical history on file. No family history on file. History  Substance Use Topics  . Smoking status: Never Smoker   . Smokeless tobacco: Not on file  . Alcohol Use: No    Review of Systems  Constitutional: Negative for fever and chills.  Musculoskeletal: Positive for arthralgias (right and left great toes.). Negative for myalgias.  Skin: Negative for wound.       Gout under right great toe.  Neurological: Negative for weakness and numbness.    Allergies  Review of patient's allergies indicates no known allergies.  Home Medications   Current Outpatient Rx  Name  Route  Sig  Dispense  Refill  . cyclobenzaprine (FLEXERIL) 10 MG tablet   Oral   Take 1 tablet (10 mg total) by mouth 2 (two) times daily as needed for muscle  spasms.   20 tablet   0   . diclofenac (VOLTAREN) 75 MG EC tablet   Oral   Take 1 tablet (75 mg total) by mouth 2 (two) times daily. Take with food   14 tablet   0   . naproxen (NAPROSYN) 500 MG tablet   Oral   Take 1 tablet (500 mg total) by mouth 2 (two) times daily.   30 tablet   0   . traMADol (ULTRAM) 50 MG tablet   Oral   Take 1 tablet (50 mg total) by mouth every 6 (six) hours as needed for pain.   15 tablet   0   . traMADol (ULTRAM) 50 MG tablet   Oral   Take 1 tablet (50 mg total) by mouth every 6 (six) hours as needed.   15 tablet   0   . triamcinolone cream (KENALOG) 0.1 %   Topical   Apply 1 application topically 3 (three) times daily.   15 g   0    BP 134/66  Pulse 72  Temp(Src) 98.7 F (37.1 C) (Oral)  Resp 22  SpO2 99%  Physical Exam  Nursing note and vitals reviewed. Constitutional: He appears well-developed and well-nourished. No distress.  HENT:  Head: Normocephalic and atraumatic.  Neck: Neck supple.  Pulmonary/Chest: Effort normal.  Musculoskeletal:  Right first MTP is edematous and tender to palpation.  No warmth, erythema. DP intact. Pt able to move digit. No other bony tenderness. Calf non tender. Capillary refill < 2 seconds.   Neurological: He is alert.  Skin: He is not diaphoretic.    ED Course  Procedures (including critical care time)  DIAGNOSTIC STUDIES: Oxygen Saturation is 99% on room air, normal by my interpretation.    COORDINATION OF CARE: 12:30 PM-Discussed treatment plan with pt at bedside and pt agreed to plan.    Labs Review Labs Reviewed - No data to display Imaging Review No results found.  EKG Interpretation   None       MDM   1. Gout    Patient with history of gout presenting with his typical gout flare in his right first MTP. There are no red flags or concerning features to make me suspect a septic joint or other injury to the toe. Patient states this is exactly the same as his prior gout.  Patient discharged home with Indocin and Vicodin. PCP followup.  Discussed  findings, treatment, and follow up  with patient.  Pt given return precautions.  Pt verbalizes understanding and agrees with plan.      I doubt any other EMC precluding discharge at this time including, but not necessarily limited to the following: septic joint   I personally performed the services described in this documentation, which was scribed in my presence. The recorded information has been reviewed and is accurate.    [place], PA-C 01/29/13 [phone]

## 2013-01-29 NOTE — ED Notes (Signed)
Pt states he has had pain in joint beneath his right great toe since last week (around Christmas).  Pt has moderate swelling to area with a small protrusion. Pt has history of gout in right foot.  Area is not exceptionally warm to touch.

## 2013-01-29 NOTE — Discharge Instructions (Signed)
Read the information below.  Use the prescribed medication as directed.  Please discuss all new medications with your pharmacist.  Do not take additional tylenol while taking the prescribed pain medication to avoid overdose.  You may return to the Emergency Department at any time for worsening condition or any new symptoms that concern you.  If you develop fevers, uncontrolled pain, weakness or numbness of the extremity, severe discoloration of the skin, or you are unable to walk, return to the ER for a recheck.    Purine Restricted Diet A low-purine diet consists of foods that reduce uric acid made in your body. INDICATIONS FOR USE  Your caregiver may ask you to follow a low-purine diet to reduce gout flairs.  GUIDELINES  Avoid high-purine foods, including all alcohol, yeast extracts taken as supplements, and sauces made from meats (like gravy). Do not eat high-purine meats, including anchovies, sardines, herring, mussels, tuna, codfish, scallops, trout, haddock, bacon, organ meats, tripe, goose, wild game, and sweetbreads.  Grains  Allowed/Recommended: All, except those listed to consume in moderation.  Consume in Moderation: Oatmeal ( cup uncooked daily), wheat bran or germ ( cup daily), and whole grains. Vegetables  Allowed/Recommended: All, except those listed to consume in moderation.  Consume in Moderation: Asparagus, cauliflower, spinach, mushrooms, and green peas ( cup daily). Fruit  Allowed/Recommended: All.  Consume in Moderation: None. Meat and Meat Substitutes  Allowed/Recommended: Eggs, nuts, and peanut butter.  Consume in Moderation: Limit to 4 to 6 oz daily. Avoid high-purine meats. Lentils, peas, and dried beans (1 cup daily). Milk  Allowed/Recommended: All. Choose low-fat or skim when possible.  Consume in Moderation: None. Fats and Oils  Allowed/Recommended: All.  Consume in Moderation: None. Beverages  Allowed/Recommended: All, except those listed to  avoid.  Avoid: All alcohol. Condiments/Miscellaneous  Allowed/Recommended: All, except those listed to consume in moderation.  Consume in Moderation: Bouillon and meat-based broths and soups. Document Released: 05/12/2010 Document Revised: 04/09/2011 Document Reviewed: 05/12/2010 [facility] Patient Information 2014 [place], [place].  Gout Gout is an inflammatory arthritis caused by a buildup of uric acid crystals in the joints. Uric acid is a chemical that is normally present in the blood. When the level of uric acid in the blood is too high it can form crystals that deposit in your joints and tissues. This causes joint redness, soreness, and swelling (inflammation). Repeat attacks are common. Over time, uric acid crystals can form into masses (tophi) near a joint, destroying bone and causing disfigurement. Gout is treatable and often preventable. CAUSES  The disease begins with elevated levels of uric acid in the blood. Uric acid is produced by your body when it breaks down a naturally found substance called purines. Certain foods you eat, such as meats and fish, contain high amounts of purines. Causes of an elevated uric acid level include:  Being passed down from parent to child (heredity).  Diseases that cause increased uric acid production (such as obesity, psoriasis, and certain cancers).  Excessive alcohol use.  Diet, especially diets rich in meat and seafood.  Medicines, including certain cancer-fighting medicines (chemotherapy), water pills (diuretics), and aspirin.  Chronic kidney disease. The kidneys are no longer able to remove uric acid well.  Problems with metabolism. Conditions strongly associated with gout include:  Obesity.  High blood pressure.  High cholesterol.  Diabetes. Not everyone with elevated uric acid levels gets gout. It is not understood why some people get gout and others do not. Surgery, joint injury, and eating too much  of certain foods are some of  the factors that can lead to gout attacks. SYMPTOMS   An attack of gout comes on quickly. It causes intense pain with redness, swelling, and warmth in a joint.  Fever can occur.  Often, only one joint is involved. Certain joints are more commonly involved:  Base of the big toe.  Knee.  Ankle.  Wrist.  Finger. Without treatment, an attack usually goes away in a few days to weeks. Between attacks, you usually will not have symptoms, which is different from many other forms of arthritis. DIAGNOSIS  Your caregiver will suspect gout based on your symptoms and exam. In some cases, tests may be recommended. The tests may include:  Blood tests.  Urine tests.  X-rays.  Joint fluid exam. This exam requires a needle to remove fluid from the joint (arthrocentesis). Using a microscope, gout is confirmed when uric acid crystals are seen in the joint fluid. TREATMENT  There are two phases to gout treatment: treating the sudden onset (acute) attack and preventing attacks (prophylaxis).  Treatment of an Acute Attack.  Medicines are used. These include anti-inflammatory medicines or steroid medicines.  An injection of steroid medicine into the affected joint is sometimes necessary.  The painful joint is rested. Movement can worsen the arthritis.  You may use warm or cold treatments on painful joints, depending which works best for you.  Treatment to Prevent Attacks.  If you suffer from frequent gout attacks, your caregiver may advise preventive medicine. These medicines are started after the acute attack subsides. These medicines either help your kidneys eliminate uric acid from your body or decrease your uric acid production. You may need to stay on these medicines for a very long time.  The early phase of treatment with preventive medicine can be associated with an increase in acute gout attacks. For this reason, during the first few months of treatment, your caregiver may also advise  you to take medicines usually used for acute gout treatment. Be sure you understand your caregiver's directions. Your caregiver may make several adjustments to your medicine dose before these medicines are effective.  Discuss dietary treatment with your caregiver or dietitian. Alcohol and drinks high in sugar and fructose and foods such as meat, poultry, and seafood can increase uric acid levels. Your caregiver or dietician can advise you on drinks and foods that should be limited. HOME CARE INSTRUCTIONS   Do not take aspirin to relieve pain. This raises uric acid levels.  Only take over-the-counter or prescription medicines for pain, discomfort, or fever as directed by your caregiver.  Rest the joint as much as possible. When in bed, keep sheets and blankets off painful areas.  Keep the affected joint raised (elevated).  Apply warm or cold treatments to painful joints. Use of warm or cold treatments depends on which works best for you.  Use crutches if the painful joint is in your leg.  Drink enough fluids to keep your urine clear or pale yellow. This helps your body get rid of uric acid. Limit alcohol, sugary drinks, and fructose drinks.  Follow your dietary instructions. Pay careful attention to the amount of protein you eat. Your daily diet should emphasize fruits, vegetables, whole grains, and fat-free or low-fat milk products. Discuss the use of coffee, vitamin C, and cherries with your caregiver or dietician. These may be helpful in lowering uric acid levels.  Maintain a healthy body weight. SEEK MEDICAL CARE IF:   You develop diarrhea, vomiting, or  any side effects from medicines.  You do not feel better in 24 hours, or you are getting worse. SEEK IMMEDIATE MEDICAL CARE IF:   Your joint becomes suddenly more tender, and you have chills or a fever. MAKE SURE YOU:   Understand these instructions.  Will watch your condition.  Will get help right away if you are not doing well  or get worse. Document Released: 01/13/2000 Document Revised: 05/12/2012 Document Reviewed: 08/29/2011 [facility] Patient Information 2014 [place], [place].

## 2013-01-30 NOTE — ED Provider Notes (Signed)
Medical screening examination/treatment/procedure(s) were performed by non-physician practitioner and as supervising physician I was immediately available for consultation/collaboration.  EKG Interpretation   None        Donnetta HutchingBrian Macrina Lehnert, MD 01/30/13 [phone]

## 2013-02-23 ENCOUNTER — Emergency Department (HOSPITAL_COMMUNITY): Payer: Self-pay

## 2013-02-23 ENCOUNTER — Emergency Department (HOSPITAL_COMMUNITY)
Admission: EM | Admit: 2013-02-23 | Discharge: 2013-02-23 | Disposition: A | Discharge status: Home / Self Care | Payer: Self-pay | Attending: Emergency Medicine | Admitting: Emergency Medicine

## 2013-02-23 ENCOUNTER — Encounter (HOSPITAL_COMMUNITY): Payer: Self-pay | Admitting: Emergency Medicine

## 2013-02-23 DIAGNOSIS — M109 Gout, unspecified: Secondary | ICD-10-CM | POA: Insufficient documentation

## 2013-02-23 HISTORY — DX: Gout, unspecified: M10.9

## 2013-02-23 MED ORDER — INDOMETHACIN 50 MG PO CAPS
50.0000 mg | ORAL_CAPSULE | Freq: Once | ORAL | Status: AC
  Administered 2013-02-23: 50 mg via ORAL
  Filled 2013-02-23: qty 1

## 2013-02-23 MED ORDER — HYDROCODONE-ACETAMINOPHEN 5-325 MG PO TABS: 1.0000 | ORAL_TABLET | ORAL | Status: DC | PRN

## 2013-02-23 MED ORDER — INDOMETHACIN 50 MG PO CAPS: 50.0000 mg | ORAL_CAPSULE | Freq: Three times a day (TID) | ORAL | Status: DC | PRN

## 2013-02-23 NOTE — ED Provider Notes (Signed)
Medical screening examination/treatment/procedure(s) were performed by non-physician practitioner and as supervising physician I was immediately available for consultation/collaboration.  EKG Interpretation   None         Layla MawKristen N Ward, DO 02/23/13 2331

## 2013-02-23 NOTE — Discharge Instructions (Signed)
Gout Gout is when your joints become red, sore, and swell (inflammed). This is caused by the buildup of uric acid crystals in the joints. Uric acid is a chemical that is normally in the blood. If the level of uric acid gets too high in the blood, these crystals form in your joints and tissues. Over time, these crystals can form into masses near the joints and tissues. These masses can destroy bone and cause the bone to look misshapen (deformed). HOME CARE   Do not take aspirin for pain.  Only take medicine as told by your doctor.  Rest the joint as much as you can. When in bed, keep sheets and blankets off painful areas.  Keep the sore joints raised (elevated).  Put warm or cold packs on painful joints. Use of warm or cold packs depends on which works best for you.  Use crutches if the painful joint is in your leg.  Drink enough fluids to keep your pee (urine) clear or pale yellow. Limit alcohol, sugary drinks, and drinks with fructose in them.  Follow your diet instructions. Pay careful attention to how much protein you eat. Include fruits, vegetables, whole grains, and fat-free or low-fat milk products in your daily diet. Talk to your doctor or dietician about the use of coffee, vitamin C, and cherries. These may help lower uric acid levels.  Keep a healthy body weight. GET HELP RIGHT AWAY IF:   You have watery poop (diarrhea), throw up (vomit), or have any side effects from medicines.  You do not feel better in 24 hours, or you are getting worse.  Your joint becomes suddenly more tender, and you have chills or a fever. MAKE SURE YOU:   Understand these instructions.  Will watch your condition.  Will get help right away if you are not doing well or get worse. Document Released: 10/25/2007 Document Revised: 05/12/2012 Document Reviewed: 04/25/2009 [facility] Patient Information 2014 [place], [place].  Emergency Department Resource Guide 1) Find a Doctor and Pay Out of  Pocket Although you won't have to find out who is covered by your insurance plan, it is a good idea to ask around and get recommendations. You will then need to call the office and see if the doctor you have chosen will accept you as a new patient and what types of options they offer for patients who are self-pay. Some doctors offer discounts or will set up payment plans for their patients who do not have insurance, but you will need to ask so you aren't surprised when you get to your appointment.  2) Contact Your Local Health Department Not all health departments have doctors that can see patients for sick visits, but many do, so it is worth a call to see if yours does. If you don't know where your local health department is, you can check in your phone book. The CDC also has a tool to help you locate your state's health department, and many state websites also have listings of all of their local health departments.  3) Find a Walk-in Clinic If your illness is not likely to be very severe or complicated, you may want to try a walk in clinic. These are popping up all over the country in pharmacies, drugstores, and shopping centers. They're usually staffed by nurse practitioners or physician assistants that have been trained to treat common illnesses and complaints. They're usually fairly quick and inexpensive. However, if you have serious medical issues or chronic medical problems, these are  probably not your best option.  No Primary Care Doctor: - Call Health Connect at  [phone] - they can help you locate a primary care doctor that  accepts your insurance, provides certain services, etc. - Physician Referral Service- [phone]  Chronic Pain Problems: Organization         Address  Phone   Notes  [facility]  [phone] Patients need to be referred by their primary care doctor.   Medication Assistance: Organization         Address  Phone   Notes  [facility]  Medication [facility] [address] [place], [place] [address] [phone] --Must be a resident of [facility] -- Must have NO insurance coverage whatsoever (no Medicaid/ Medicare, etc.) -- The pt. MUST have a primary care doctor that directs their care regularly and follows them in the community   [facility]  [phone]   [employer]  [phone]    Agencies that provide inexpensive medical care: Organization         Address  Phone   Notes  [facility]  [phone]   [facility]    [phone]   [facility] [address] [place], [place] [address] [phone]   [facility] [address] [place] [place] [phone]   Planned Parenthood    [phone]   [facility]    [phone]   [facility]  [address], [place] Phone:  [phone], Fax:  [phone] Hours of Operation:  9 am - 6 pm, M-F.  Also accepts Medicaid/Medicare and self-pay.  [facility]  [address], [place] Phone: [phone], Fax: [phone]. Hours of Operation:  8:30 am - 5:30 pm, M-F.  Also accepts Medicaid and self-pay.  [facility] [place] [address] [place] Phone: [phone]   [facility] [address] Natasha Bence [place], [place] [phone], Ext. [phone] Mondays & Thursdays: 7-9 AM.  First 15 patients are seen on a first come, first serve basis.    Medicaid-accepting [facility] Providers:  Organization         Address  Phone   Notes  [facility] [address], [place] [phone] Also accepts self-pay patients.  [facility] [address] Laurell Josephs [place], [place]  [phone]   [facility] [address], [place] [phone]   [facility] [address], [place] [phone]   Renaye Rakers [address], [place]   [phone] Only accepts [place] Access [place] patients after they have their name applied to their card.   Self-Pay (no insurance) in [facility]:  Organization         Address  Phone   Notes  Sickle Cell Patients, [facility] [address] [place], [place] [phone]   [facility] [address] [place], [place] [phone]   [facility]  [address], [place] [phone]   [facility]/Dr. Osei-Bonsu  [address], [place] or [address], [place] [phone] Phone number for both [place] and [place] locations is the same.  [facility]  [address], [place] [phone]   [facility] [address] [place] or [address] [phone]   [facility] [address], [place] [phone], phone; [phone], fax Sees patients 1st and 3rd Saturday of every month.  Must not qualify for public or private insurance (i.e. Medicaid, Medicare, [place] Health Choice, Veterans' Benefits)  Household income should be no more than 200% of the poverty level The clinic cannot treat you if you are pregnant or think you are pregnant  Sexually transmitted diseases are not treated at the clinic.    Dental Care: Organization         Address  Phone  Notes  [facility] Department of [facility] [address] [place], [place] [phone] Accepts children up to age 40 who are enrolled in [place] or [place] Health Choice; pregnant women with a Medicaid card; and children who have applied for Medicaid or [place] Health Choice, but were declined, whose parents can pay a reduced fee at time of service.  [facility] of [facility]  [address], [place]  [phone] Accepts children up to age 16 who are enrolled in [place] or [place] Health Choice; pregnant women with a Medicaid card; and children who have applied for Medicaid or [place] Health Choice, but were declined, whose parents can pay a reduced fee at time of service.  [facility]  [address] [place], [place] [phone] Patients are seen by appointment only. Walk-ins are not accepted. [facility] will see patients 65 years of age and older. Monday - Tuesday (8am-5pm) Most Wednesdays (8:30-5pm) $30 per visit, cash only  [facility]  [address], [facility] [phone] Patients are seen by appointment only. Walk-ins are not accepted. [facility] will see patients 38 years of age and older. One Wednesday Evening (Monthly: Volunteer Based).  $30 per visit, cash only  [employer]  [phone] for adults; Children under age 75, call Graduate Pediatric Dentistry at [phone]. Children aged 80-14, please call [phone] to request a pediatric application.  Dental services are provided in all areas of dental care including fillings, crowns and bridges, complete and partial dentures, implants, gum treatment, root canals, and extractions. Preventive care is also provided. Treatment is provided to both adults and children. Patients are selected via a lottery and there is often a waiting list.   [facility] [address], [place]  [phone] [email or web address]   [facility] [address] [place], [place] [phone], Ext. [phone] Second and Fourth Thursday of each month, opens at 6:30 AM; Clinic ends at 9 AM.  Patients are seen on a first-come first-served basis, and a limited number are seen during each clinic.   [facility]  [address] Ether Griffins [place], [place] [phone]   Eligibility Requirements You must have lived in [place], [place], or [place]  counties for at least the last three months.   You cannot be eligible for state or federal sponsored [place], including [employer], [place], or [employer].   You generally cannot be eligible for healthcare insurance through your employer.    How to apply: Eligibility screenings are held every Tuesday and Wednesday afternoon from 1:00 pm until 4:00 pm. You do not need an appointment for the interview!  [facility]  [address] [place], [place] [phone]   [facility] Health Department  [phone]   [facility] Health Department  [phone]   [facility] Health Department  [phone]    Behavioral Health Resources in the Community: Intensive Outpatient Programs Organization         Address  Phone  Notes  [facility] [address] [place], [place] [phone]   [facility] Outpatient [address], [place], [place] [phone]   [facility] [address], [place], [place]  [phone]   [facility] Mental Health [address],  [place], [place] [phone] or [phone]   Substance Abuse Resources Organization         Address  Phone  Notes  [facility]  [phone]   [facility]  [phone]   [facility] [place]  [phone]   Floydene Flock  [phone]   Residential & Outpatient Substance Abuse Program  [phone]   Psychological Services Organization         Address  Phone  Notes  [facility]  [phone]   [facility] Services  [phone]   [facility] Mental Health [address], [place] [phone] or [phone]    Mobile Crisis Teams Organization         Address  Phone  Notes  [facility], Mobile Crisis Care Unit  [phone]   [facility]  [address] [place], [place] [phone]   Doristine Locks [address] [place]  [place] [phone]    Self-Help/Support Groups Organization         Address  Phone             Notes  [facility] [place] - variety of support groups  [phone]- [phone] Call for more information  Narcotics Anonymous (NA), [facility] [address], [employer] [place]  2 meetings at this location   Statistician         Address  Phone  Notes  [facility] [address],    [place]  [phone]   [facility]  [address] [place] [address], [place], [place] [phone]   [facility] [address] [place], [place] [phone] Admissions: 8am-3pm M-F  [facility] [address],    [place], [place] [phone]   [facility] [address] [place], [place], [place] [phone]   [facility] [address]  [place], [place] [phone]   [facility] - Intensive Outpatient [address] Laurell Josephs [address], [place], [place] [phone]   [facility] ([facility] [address] [place]  [place], [place] [phone] or [phone]   [facility] ([facility]) [address] [place], [place] [phone] Accepts Medicaid  [facility] [address],  [place] [phone] Substance Abuse/Addiction Treatment   [place] Organization         Address  Phone  Notes  [facility]  [phone]   Angie Fava, PhD [address] Ervin Knack [place], [place]   [phone] or [phone]   [facility] Behavioral   [address] [place], [place] [phone]   [facility] [address] [place], [place] [phone] Insurance/Medicaid/sponsorship through [employer] and Families [address]  [place], [place] [phone] [place], [place] [phone]    Dr. Adele Schilder  [phone]   [facility] Dept. 1) [address] [place] 2) [place] 3)  [address], [place] [phone]  [phone]   [place] [phone] or [phone] (After Hours)

## 2013-02-23 NOTE — ED Provider Notes (Signed)
CSN: [id number]     Arrival date & time 02/23/13  2004 History  This chart was scribed for non-physician practitioner Earley Favor working with Layla Maw Ward, DO by Carl Best, ED Scribe. This patient was seen in room WTR8/WTR8 and the patient's care was started at 9:33 PM.     Chief Complaint  Patient presents with  . Gout    The history is provided by the patient. No language interpreter was used.   HPI Comments: Leonard Pierce is a 33 y.o. male who presents to the Emergency Department complaining of gout in his right big toe that started at Christmas of 2014.  The patient was seen in the ED for his symptoms two weeks ago and was treated with pain medication.  He states that he did not have an x-ray of his right big toe at his last ED visit.  He states that the pain has since returned.  The patient states that he does not have a personal history of Hypertension.  He also states that he does not have a family history of gout.     Past Medical History  Diagnosis Date  . Gout    History reviewed. No pertinent past surgical history. No family history on file. History  Substance Use Topics  . Smoking status: Never Smoker   . Smokeless tobacco: Never Used  . Alcohol Use: No    Review of Systems  Respiratory: Negative for cough and shortness of breath.   Cardiovascular: Negative for chest pain.  Gastrointestinal: Negative for abdominal pain.  Musculoskeletal: Positive for arthralgias (right big toe) and joint swelling.  Skin: Positive for color change. Negative for rash and wound.  All other systems reviewed and are negative.    Allergies  Review of patient's allergies indicates no known allergies.  Home Medications   Current Outpatient Rx  Name  Route  Sig  Dispense  Refill  . HYDROcodone-acetaminophen (NORCO/VICODIN) 5-325 MG per tablet   Oral   Take 1-2 tablets by mouth every 4 (four) hours as needed.   20 tablet   0   . indomethacin (INDOCIN) 50 MG capsule   Oral    Take 1 capsule (50 mg total) by mouth 3 (three) times daily as needed for mild pain or moderate pain.   30 capsule   0    Triage Vitals: BP 146/90  Pulse 107  Temp(Src) 98.3 F (36.8 C) (Oral)  SpO2 99%  Physical Exam  Nursing note and vitals reviewed. Constitutional: He is oriented to person, place, and time. He appears well-developed and well-nourished. No distress.  Morbidly obese  HENT:  Head: Normocephalic and atraumatic.  Eyes: EOM are normal. Pupils are equal, round, and reactive to light.  Neck: Normal range of motion. Neck supple.  Cardiovascular: Normal rate and regular rhythm.   Pulmonary/Chest: Effort normal and breath sounds normal.  Musculoskeletal: Normal range of motion. He exhibits tenderness.  Neurological: He is alert and oriented to person, place, and time.  Skin: Skin is warm and dry.    ED Course  Procedures (including critical care time)  DIAGNOSTIC STUDIES: Oxygen Saturation is 99% on room air, normal by my interpretation.    COORDINATION OF CARE: 11:29 PM- Discussed obtaining an x-ray of the patient's right toe and administering pain medication in the ED.  The patient agreed to the treatment plan.   Labs Review Labs Reviewed - No data to display Imaging Review Dg Foot Complete Right  02/23/2013   CLINICAL DATA:  Right foot pain, history of gout, no injury.  EXAM: RIGHT FOOT COMPLETE - 3+ VIEW  COMPARISON:  None available for comparison at time of study interpretation.  FINDINGS: No acute fracture deformity or dislocation. Mild first metatarsophalangeal osteoarthrosis. Small os perineum. Mild fragmentation of the first interphalangeal joint may reflect remote injury. No destructive bony lesions. Suspected remote lateral malleolus avulsion injury, incompletely characterized. Soft tissue planes are nonsuspicious.  IMPRESSION: No acute fracture deformity or dislocation.  Mild first MTP osteoarthrosis.   Electronically Signed   By: Awilda Metroourtnay  Bloomer   On:  02/23/2013 23:21    EKG Interpretation   None       MDM   1. Gout    Foot was x-rayed.  No destructive bony lesions.  Soft tissue swelling.  Patient again, will be treated with for gout results were given a resource list to help him find a primary care physician  I personally performed the services described in this documentation, which was scribed in my presence. The recorded information has been reviewed and is accurate.     Arman FilterGail K Tamaya Pun, NP 02/23/13 2330

## 2013-02-23 NOTE — ED Notes (Signed)
Pt has been having R toe pain d/t gout since approx. Christmas that went away and came back yesterday.

## 2013-03-19 ENCOUNTER — Encounter (HOSPITAL_COMMUNITY): Payer: Self-pay | Admitting: Emergency Medicine

## 2013-03-19 ENCOUNTER — Emergency Department (HOSPITAL_COMMUNITY): Payer: Self-pay

## 2013-03-19 ENCOUNTER — Emergency Department (HOSPITAL_COMMUNITY)
Admission: EM | Admit: 2013-03-19 | Discharge: 2013-03-19 | Disposition: A | Discharge status: Home / Self Care | Payer: Self-pay | Attending: Emergency Medicine | Admitting: Emergency Medicine

## 2013-03-19 DIAGNOSIS — Y9389 Activity, other specified: Secondary | ICD-10-CM | POA: Insufficient documentation

## 2013-03-19 DIAGNOSIS — S99929A Unspecified injury of unspecified foot, initial encounter: Principal | ICD-10-CM

## 2013-03-19 DIAGNOSIS — S99922A Unspecified injury of left foot, initial encounter: Secondary | ICD-10-CM

## 2013-03-19 DIAGNOSIS — IMO0002 Reserved for concepts with insufficient information to code with codable children: Secondary | ICD-10-CM | POA: Insufficient documentation

## 2013-03-19 DIAGNOSIS — Z791 Long term (current) use of non-steroidal anti-inflammatories (NSAID): Secondary | ICD-10-CM | POA: Insufficient documentation

## 2013-03-19 DIAGNOSIS — Y92410 Unspecified street and highway as the place of occurrence of the external cause: Secondary | ICD-10-CM | POA: Insufficient documentation

## 2013-03-19 DIAGNOSIS — S99919A Unspecified injury of unspecified ankle, initial encounter: Secondary | ICD-10-CM | POA: Insufficient documentation

## 2013-03-19 DIAGNOSIS — S8990XA Unspecified injury of unspecified lower leg, initial encounter: Secondary | ICD-10-CM | POA: Insufficient documentation

## 2013-03-19 DIAGNOSIS — M109 Gout, unspecified: Secondary | ICD-10-CM | POA: Insufficient documentation

## 2013-03-19 MED ORDER — IBUPROFEN 200 MG PO TABS: 600.0000 mg | ORAL_TABLET | Freq: Four times a day (QID) | ORAL | Status: DC | PRN

## 2013-03-19 MED ORDER — OXYCODONE-ACETAMINOPHEN 5-325 MG PO TABS
1.0000 | ORAL_TABLET | Freq: Once | ORAL | Status: AC
  Administered 2013-03-19: 1 via ORAL
  Filled 2013-03-19: qty 1

## 2013-03-19 NOTE — ED Notes (Signed)
Charted at nurses station as registration was in the room.
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
## 2013-03-19 NOTE — Discharge Instructions (Signed)
Elastic Bandage and RICE °Elastic bandages come in different shapes and sizes. They perform different functions. Your caregiver will help you to decide what is best for your protection, recovery, or rehabilitation following an injury. The following are some general tips to help you use an elastic bandage. °· Use the bandage as directed by the maker of the bandage you are using. °· Do not wrap it too tight. This may cut off the circulation of the arm or leg below the bandage. °· If part of your body beyond the bandage becomes blue, numb, or swollen, it is too tight. Loosen the bandage as needed to prevent these problems. °· See your caregiver or trainer if the bandage seems to be making your problems worse rather than better. °Bandages may be a reminder to you that you have an injury. However, they provide very little support. The few pounds of support they provide are minor considering the pressure it takes to injure a joint or tear ligaments. Therefore, the joint will not be able to handle all of the wear and tear it could before the injury. °The routine care of many injuries includes Rest, Ice, Compression, and Elevation (RICE). °· Rest is required to allow your body to heal. Generally, routine activities can be resumed when comfortable. Injured tendons and bones take about 6 weeks to heal. °· Icing the injury helps keep the swelling down and reduces pain. Do not apply ice directly to the skin. Put ice in a plastic bag. Place a towel between the skin and the bag. This will prevent frostbite to the skin. Apply ice bags to the injured area for 15-20 minutes, every 2 hours while awake. Do this for the first 24 to 48 hours, then as directed by your caregiver. °· Compression helps keep swelling down, gives support, and helps with discomfort. If an elastic bandage has been applied today, it should be removed and reapplied every 3 to 4 hours. It should not be applied tightly, but firmly enough to keep swelling down.  Watch fingers or toes for swelling, bluish discoloration, coldness, numbness, or increased pain. If any of these problems occur, remove the bandage and reapply it more loosely. If these problems persist, contact your caregiver. °· Elevation helps reduce swelling and decreases pain. The injured area (arms, hands, legs, or feet) should be placed near to or above the heart (center of the chest) if able. °Persistent pain and inability to use the injured area for more than 2 to 3 days are warning signs. You should see a caregiver for a follow-up visit as soon as possible. Initially, a minor broken bone (hairline fracture) may not be seen on X-rays. It may take 7 to 10 days to finally show up. Continued pain and swelling show that further evaluation and/or X-rays are needed. Make a follow-up visit with your caregiver. A specialist in reading X-rays (radiologist) will read your X-rays again. °Finding out the results of your test °Not all test results are available during your visit. If your test results are not back during the visit, make an appointment with your caregiver to find out the results. Do not assume everything is normal if you have not heard from your caregiver or the medical facility. It is important for you to follow up on all of your test results. °Document Released: 07/07/2001 Document Revised: 04/09/2011 Document Reviewed: 05/19/2007 °[employer]® Patient Information ©2014 [employer]. ° °

## 2013-03-23 NOTE — ED Provider Notes (Signed)
Medical screening examination/treatment/procedure(s) were performed by non-physician practitioner and as supervising physician I was immediately available for consultation/collaboration.  EKG Interpretation   None         Rolland PorterMark Chayanne Speir, MD 03/23/13 [phone]

## 2013-04-02 ENCOUNTER — Emergency Department (HOSPITAL_COMMUNITY)
Admission: EM | Admit: 2013-04-02 | Discharge: 2013-04-03 | Discharge status: Against Medical Advice | Payer: Self-pay | Attending: Emergency Medicine | Admitting: Emergency Medicine

## 2013-04-02 ENCOUNTER — Encounter (HOSPITAL_COMMUNITY): Payer: Self-pay | Admitting: Emergency Medicine

## 2013-04-02 ENCOUNTER — Emergency Department (HOSPITAL_COMMUNITY): Payer: Self-pay

## 2013-04-02 DIAGNOSIS — M109 Gout, unspecified: Secondary | ICD-10-CM | POA: Insufficient documentation

## 2013-04-02 DIAGNOSIS — R079 Chest pain, unspecified: Secondary | ICD-10-CM | POA: Insufficient documentation

## 2013-04-02 DIAGNOSIS — R5383 Other fatigue: Secondary | ICD-10-CM

## 2013-04-02 DIAGNOSIS — I252 Old myocardial infarction: Secondary | ICD-10-CM | POA: Insufficient documentation

## 2013-04-02 DIAGNOSIS — R5381 Other malaise: Secondary | ICD-10-CM | POA: Insufficient documentation

## 2013-04-02 DIAGNOSIS — R0602 Shortness of breath: Secondary | ICD-10-CM | POA: Insufficient documentation

## 2013-04-02 HISTORY — DX: Acute myocardial infarction, unspecified: I21.9

## 2013-04-02 LAB — CBC
HEMATOCRIT: 38.3 % — AB (ref 39.0–52.0)
HEMOGLOBIN: 12.8 g/dL — AB (ref 13.0–17.0)
MCH: 27.5 pg (ref 26.0–34.0)
MCHC: 33.4 g/dL (ref 30.0–36.0)
MCV: 82.4 fL (ref 78.0–100.0)
Platelets: 244 10*3/uL (ref 150–400)
RBC: 4.65 MIL/uL (ref 4.22–5.81)
RDW: 13.7 % (ref 11.5–15.5)
WBC: 7 10*3/uL (ref 4.0–10.5)

## 2013-04-02 LAB — BASIC METABOLIC PANEL
BUN: 15 mg/dL (ref 6–23)
CALCIUM: 9.4 mg/dL (ref 8.4–10.5)
CHLORIDE: 100 meq/L (ref 96–112)
CO2: 23 meq/L (ref 19–32)
CREATININE: 1.18 mg/dL (ref 0.50–1.35)
GFR calc Af Amer: >90 mL/min (ref >90)
GFR calc non Af Amer: 79 mL/min — ABNORMAL LOW (ref >90)
GLUCOSE: 94 mg/dL (ref 70–99)
Potassium: 3.7 mEq/L (ref 3.7–5.3)
Sodium: 139 mEq/L (ref 137–147)

## 2013-04-02 LAB — I-STAT TROPONIN, ED: Troponin i, poc: 0 ng/mL (ref 0.00–0.08)

## 2013-04-02 LAB — PRO B NATRIURETIC PEPTIDE: Pro B Natriuretic peptide (BNP): <5 pg/mL (ref 0–125)

## 2013-04-02 MED ORDER — KETOROLAC TROMETHAMINE 30 MG/ML IJ SOLN
30.0000 mg | Freq: Once | INTRAMUSCULAR | Status: AC
  Administered 2013-04-02: 30 mg via INTRAVENOUS
  Filled 2013-04-02: qty 1

## 2013-04-02 MED ORDER — MORPHINE SULFATE 4 MG/ML IJ SOLN
4.0000 mg | Freq: Once | INTRAMUSCULAR | Status: AC
  Administered 2013-04-02: 4 mg via INTRAVENOUS
  Filled 2013-04-02: qty 1

## 2013-04-02 MED ORDER — SODIUM CHLORIDE 0.9 % IV BOLUS (SEPSIS)
1000.0000 mL | Freq: Once | INTRAVENOUS | Status: AC
  Administered 2013-04-02: 1000 mL via INTRAVENOUS

## 2013-04-02 NOTE — ED Provider Notes (Signed)
TIME SEEN: 9:09 PM  CHIEF COMPLAINT: Chest pain, shortness of breath  HPI: Patient is a 33 year old male he reports he has had an MI at 33 years old at [facility] who presents emergency department with sharp left-sided chest pain that started at 7:30 PM while he was cooking. He reports the pain radiates into his left arm and he has shortness of breath and feels very weak. Patient was given aspirin and 3 nitroglycerin by EMS without relief of symptoms. He denies any nausea, vomiting, dizziness or diaphoresis. He denies a history is exertional or pleuritic. He is not sure if he has any stents or if he had a cardiac catheterization. Patient denies any history of DVT or PE, lower extremity swelling or pain, recent prolonged immobilization such as flight or hospitalization or surgery, trauma, fracture. Patient reports it hurts when he tries to sit up and when he moves.  History is difficult to obtain from the patient as he is uncooperative. He is mumbling and will keep his eyes closed during most of the exam. When questioned about this he states "I just feel so weak".  ROS: See HPI Constitutional: no fever  Eyes: no drainage  ENT: no runny nose   Cardiovascular:   chest pain  Resp: no SOB  GI: no vomiting GU: no dysuria Integumentary: no rash  Allergy: no hives  Musculoskeletal: no leg swelling  Neurological: no slurred speech ROS otherwise negative  PAST MEDICAL HISTORY/PAST SURGICAL HISTORY:  Past Medical History  Diagnosis Date  . Gout   . Acute MI     Reports MI at 19, seen at [facility]     MEDICATIONS:  Prior to Admission medications   Medication Sig Start Date End Date Taking? Authorizing Provider  HYDROcodone-acetaminophen (NORCO/VICODIN) 5-325 MG per tablet Take 1-2 tablets by mouth every 4 (four) hours as needed. 02/23/13   Arman Filter, NP  ibuprofen (ADVIL,MOTRIN) 200 MG tablet Take 3 tablets (600 mg total) by mouth every 6 (six) hours as needed. 03/19/13   Fayrene Helper, PA-C   indomethacin (INDOCIN) 50 MG capsule Take 1 capsule (50 mg total) by mouth 3 (three) times daily as needed for mild pain or moderate pain. 02/23/13   Arman Filter, NP    ALLERGIES:  No Known Allergies  SOCIAL HISTORY:  History  Substance Use Topics  . Smoking status: Never Smoker   . Smokeless tobacco: Never Used  . Alcohol Use: No    FAMILY HISTORY: History reviewed. No pertinent family history.  EXAM: BP 134/70  Pulse 68  Resp 20  SpO2 100% CONSTITUTIONAL: Alert and oriented and responds appropriately to questions. Well-appearing; well-nourished, obese HEAD: Normocephalic EYES: Conjunctivae clear, PERRL ENT: normal nose; no rhinorrhea; moist mucous membranes; pharynx without lesions noted NECK: Supple, no meningismus, no LAD  CARD: RRR; S1 and S2 appreciated; no murmurs, no clicks, no rubs, no gallops RESP: Normal chest excursion without splinting or tachypnea; breath sounds clear and equal bilaterally; no wheezes, no rhonchi, no rales, chest wall is extremely tender to palpation with no crepitus or ecchymosis or deformity, patient is unable to sit upright fully in bed given that it hurts when he moves ABD/GI: Normal bowel sounds; non-distended; soft, non-tender, no rebound, no guarding BACK:  The back appears normal and is non-tender to palpation, there is no CVA tenderness EXT: Normal ROM in all joints; non-tender to palpation; no edema; normal capillary refill; no cyanosis    SKIN: Normal color for age and race; warm NEURO: Moves  all extremities equally PSYCH: The patient's mood and manner are appropriate. Grooming and personal hygiene are appropriate.  MEDICAL DECISION MAKING: Patient here with likely musculoskeletal chest pain. His first cardiac labs are negative. EKG shows no new ischemic changes. Chest x-ray is clear. He reports his symptoms started at 7:30 PM tonight. We'll repeat a second cardiac labs at 1:30 AM. He has no respiratory for pulmonary embolus and is PERC  negative. His chest wall is extremely tender to palpation and his pain is worse with movement. We'll give Toradol and reassess.  When questioned why patient has had an MI at such a young age he states "I don't know, can you look it up". He denies that he has a history of hypertension, diabetes, hyperlipidemia, tobacco use or family history of premature CAD. [facility].  ED PROGRESS: Patient's pain is not improved with nitroglycerin or Toradol. We'll give morphine. Second troponin pending at 1:30 AM. Discussed this plan with patient and he agrees. Patient states "can't you hurry this up so I can get home".  Attempting [facility].   11:54 PM  Pt has decided to leave the ED AMA.  He does not want to stay for second set of cardiac enzymes.  Pt is competent to make this decision and is aware of risks including significant morbidity and disability and death.  He verbalizes these risks back for me.  Will have him follow up with his cardiologist, Dr. Shana ChuteSpruill.   EKG Interpretation  Date/Time:  Thursday April 02 2013 20:57:22 EST Ventricular Rate:  82 PR Interval:  183 QRS Duration: 82 QT Interval:  363 QTC Calculation: 424 R Axis:   72 Text Interpretation:  Sinus rhythm Borderline repolarization abnormality Minimal ST elevation, anterior leads No significant change since last tracing Confirmed by WARD,  DO, KRISTEN [phone]([id number]) on 04/02/2013 9:07:49 PM          Layla MawKristen N Ward, DO 04/02/13 2355

## 2013-04-02 NOTE — ED Notes (Signed)
Per EMS: Pt driving and reports sudden onset of 10/10 left sided "sharp" CP radiating down left arm with SOB, weakness. Denies N/V/diaphoresis. Pt given 325 aspirin and 3 nitro with no relief. Hx: MI at 19 years.  ST elevation noted v1, v2, v3. 100% RA. 110/73. 90 SR.

## 2013-04-03 NOTE — Discharge Instructions (Signed)
You were seen in the emergency department for chest pain. Given your history of a prior heart attack, we recommend that you stay in the hospital for further evaluation. You have decided to leave the emergency department AGAINST MEDICAL ADVICE. There are risks including significant disability, worsening symptoms, death. If you change your mind and decide to be reevaluated or have any chest pain, shortness of breath, pedal dizzy or begin to suddenly sweat, please return to the emergency department.  Chest Pain (Nonspecific) It is often hard to give a specific diagnosis for the cause of chest pain. There is always a chance that your pain could be related to something serious, such as a heart attack or a blood clot in the lungs. You need to follow up with your caregiver for further evaluation. CAUSES   Heartburn.  Pneumonia or bronchitis.  Anxiety or stress.  Inflammation around your heart (pericarditis) or lung (pleuritis or pleurisy).  A blood clot in the lung.  A collapsed lung (pneumothorax). It can develop suddenly on its own (spontaneous pneumothorax) or from injury (trauma) to the chest.  Shingles infection (herpes zoster virus). The chest wall is composed of bones, muscles, and cartilage. Any of these can be the source of the pain.  The bones can be bruised by injury.  The muscles or cartilage can be strained by coughing or overwork.  The cartilage can be affected by inflammation and become sore (costochondritis). DIAGNOSIS  Lab tests or other studies, such as X-rays, electrocardiography, stress testing, or cardiac imaging, may be needed to find the cause of your pain.  TREATMENT   Treatment depends on what may be causing your chest pain. Treatment may include:  Acid blockers for heartburn.  Anti-inflammatory medicine.  Pain medicine for inflammatory conditions.  Antibiotics if an infection is present.  You may be advised to change lifestyle habits. This includes stopping  smoking and avoiding alcohol, caffeine, and chocolate.  You may be advised to keep your head raised (elevated) when sleeping. This reduces the chance of acid going backward from your stomach into your esophagus.  Most of the time, nonspecific chest pain will improve within 2 to 3 days with rest and mild pain medicine. HOME CARE INSTRUCTIONS   If antibiotics were prescribed, take your antibiotics as directed. Finish them even if you start to feel better.  For the next few days, avoid physical activities that bring on chest pain. Continue physical activities as directed.  Do not smoke.  Avoid drinking alcohol.  Only take over-the-counter or prescription medicine for pain, discomfort, or fever as directed by your caregiver.  Follow your caregiver's suggestions for further testing if your chest pain does not go away.  Keep any follow-up appointments you made. If you do not go to an appointment, you could develop lasting (chronic) problems with pain. If there is any problem keeping an appointment, you must call to reschedule. SEEK MEDICAL CARE IF:   You think you are having problems from the medicine you are taking. Read your medicine instructions carefully.  Your chest pain does not go away, even after treatment.  You develop a rash with blisters on your chest. SEEK IMMEDIATE MEDICAL CARE IF:   You have increased chest pain or pain that spreads to your arm, neck, jaw, back, or abdomen.  You develop shortness of breath, an increasing cough, or you are coughing up blood.  You have severe back or abdominal pain, feel nauseous, or vomit.  You develop severe weakness, fainting, or chills.  You have a fever. THIS IS AN EMERGENCY. Do not wait to see if the pain will go away. Get medical help at once. Call your local emergency services (911 in [place] Do not drive yourself to the hospital. MAKE SURE YOU:   Understand these instructions.  Will watch your condition.  Will get help right  away if you are not doing well or get worse. Document Released: 10/25/2004 Document Revised: 04/09/2011 Document Reviewed: 08/21/2007 [facility] Patient Information 2014 [place], [place].

## 2013-04-03 NOTE — ED Notes (Addendum)
Pt signed out AMA. Risk explained to pt. Pt verbalized understanding. Pt ambulatory at time of d/c.

## 2013-04-03 NOTE — ED Notes (Signed)
Pt reported to this RN that they wanted to leave AMA. Dr. Elesa MassedWard notified.

## 2013-11-11 ENCOUNTER — Encounter (HOSPITAL_COMMUNITY): Payer: Self-pay | Admitting: Emergency Medicine

## 2013-11-11 ENCOUNTER — Emergency Department (HOSPITAL_COMMUNITY)
Admission: EM | Admit: 2013-11-11 | Discharge: 2013-11-11 | Disposition: A | Discharge status: Home / Self Care | Payer: No Typology Code available for payment source | Attending: Emergency Medicine | Admitting: Emergency Medicine

## 2013-11-11 DIAGNOSIS — M109 Gout, unspecified: Secondary | ICD-10-CM | POA: Insufficient documentation

## 2013-11-11 DIAGNOSIS — I252 Old myocardial infarction: Secondary | ICD-10-CM | POA: Insufficient documentation

## 2013-11-11 MED ORDER — PREDNISONE 50 MG PO TABS: 50.0000 mg | ORAL_TABLET | Freq: Every day | ORAL | Status: DC

## 2013-11-11 MED ORDER — HYDROCODONE-ACETAMINOPHEN 5-325 MG PO TABS
1.0000 | ORAL_TABLET | Freq: Once | ORAL | Status: AC
  Administered 2013-11-11: 1 via ORAL
  Filled 2013-11-11: qty 1

## 2013-11-11 MED ORDER — HYDROCODONE-ACETAMINOPHEN 5-325 MG PO TABS: 1.0000 | ORAL_TABLET | Freq: Four times a day (QID) | ORAL | Status: DC | PRN

## 2013-11-11 NOTE — Discharge Planning (Signed)
[facility]  Spoke to the patient regarding primary care resources and establishing care with a provider. Patient states he will be obtaining insurance through his employer in the upcoming weeks. Resources guide and my contact information provided for any future questions or concerns. No other needs identified at this time.

## 2013-11-11 NOTE — Discharge Instructions (Signed)
Return here as needed. Follow up with a primary doctor. °

## 2013-11-11 NOTE — ED Provider Notes (Signed)
CSN: [id number]     Arrival date & time 11/11/13  1035 History  This chart was scribed for non-physician practitioner, Ebbie Ridgehris Malgorzata Albert, PA-C working with Elwin MochaBlair Walden, MD by Greggory StallionKayla Andersen, ED scribe. This patient was seen in room TR07C/TR07C and the patient's care was started at 11:29 AM.   Chief Complaint  Patient presents with  . Gout   The history is provided by the patient. No language interpreter was used.   HPI Comments: [name] is a 33 y.o. male with history of gout who presents to the Emergency Department complaining of left first toe pain that started 2 days ago. Reports associated swelling and redness. Bearing weight and palpation worsen the pain. This is similar to past flare ups. Pt does not have a PCP.   Past Medical History  Diagnosis Date  . Gout   . Acute MI     Reports MI at 19, [facility]    History reviewed. No pertinent past surgical history. History reviewed. No pertinent family history. History  Substance Use Topics  . Smoking status: Never Smoker   . Smokeless tobacco: Never Used  . Alcohol Use: No    Review of Systems All other systems negative except as documented in the HPI. All pertinent positives and negatives as reviewed in the HPI.  Allergies  Review of patient's allergies indicates no known allergies.  Home Medications   Prior to Admission medications   Not on File   BP 141/78  Pulse 69  Temp(Src) 98.5 F (36.9 C) (Oral)  Resp 22  Ht 5\' 8"  (1.727 m)  Wt 298 lb (135.172 kg)  BMI 45.32 kg/m2  SpO2 97%  Physical Exam  Nursing note and vitals reviewed. Constitutional: He is oriented to person, place, and time. He appears well-developed and well-nourished. No distress.  HENT:  Head: Normocephalic and atraumatic.  Eyes: Pupils are equal, round, and reactive to light.  Musculoskeletal: Normal range of motion.  Pain and swelling to left great toe. Mild redness.   Neurological: He is alert and oriented to person, place, and time.   Skin: Skin is warm and dry.  Psychiatric: He has a normal mood and affect. His behavior is normal.    ED Course  Procedures (including critical care time)  DIAGNOSTIC STUDIES: Oxygen Saturation is 97% on RA, normal by my interpretation.    COORDINATION OF CARE: 11:31 AM-Discussed treatment plan which includes prednisone and pain medication with pt at bedside and pt agreed to plan.    I personally performed the services described in this documentation, which was scribed in my presence. The recorded information has been reviewed and is accurate.  Carlyle Dollyhristopher W Anasophia Pecor, PA-C 11/11/13 1144

## 2013-11-11 NOTE — ED Provider Notes (Signed)
Medical screening examination/treatment/procedure(s) were performed by non-physician practitioner and as supervising physician I was immediately available for consultation/collaboration.   EKG Interpretation None        Frances Joynt, MD 11/11/13 1152 

## 2013-11-11 NOTE — ED Notes (Signed)
Per pt sts left big toe pain since yesterday. sts hx of gout.

## 2014-08-19 ENCOUNTER — Encounter (HOSPITAL_COMMUNITY): Payer: Self-pay

## 2014-08-19 ENCOUNTER — Emergency Department (HOSPITAL_COMMUNITY)
Admission: EM | Admit: 2014-08-19 | Discharge: 2014-08-19 | Disposition: A | Discharge status: Home / Self Care | Payer: 59 | Attending: Emergency Medicine | Admitting: Emergency Medicine

## 2014-08-19 DIAGNOSIS — Z87828 Personal history of other (healed) physical injury and trauma: Secondary | ICD-10-CM | POA: Diagnosis not present

## 2014-08-19 DIAGNOSIS — M10072 Idiopathic gout, left ankle and foot: Secondary | ICD-10-CM | POA: Insufficient documentation

## 2014-08-19 DIAGNOSIS — I252 Old myocardial infarction: Secondary | ICD-10-CM | POA: Insufficient documentation

## 2014-08-19 DIAGNOSIS — M109 Gout, unspecified: Secondary | ICD-10-CM

## 2014-08-19 DIAGNOSIS — M79675 Pain in left toe(s): Secondary | ICD-10-CM | POA: Diagnosis present

## 2014-08-19 MED ORDER — INDOMETHACIN 25 MG PO CAPS: 25.0000 mg | ORAL_CAPSULE | Freq: Three times a day (TID) | ORAL | Status: DC | PRN

## 2014-08-19 MED ORDER — PREDNISONE 20 MG PO TABS: 40.0000 mg | ORAL_TABLET | Freq: Every day | ORAL | Status: DC

## 2014-08-19 NOTE — ED Notes (Signed)
Pt presents with pain to L big toe that began this morning.  Pt reports taking indocin but not everyday-reports h/o gout to toe.

## 2014-08-19 NOTE — ED Provider Notes (Signed)
CSN: [id number]     Arrival date & time 08/19/14  1322 History   First MD Initiated Contact with Patient 08/19/14 1352     No chief complaint on file.  Leonard Pierce is a 34 y.o. male with a history of gout who presents to the emergency department complaining of left great toe pain onset today. Patient reports history gout reports this pain feels similar. He is currently complaining of 8 out of 10 left great toe pain. Patient reports he takes indomethacin intermittently and this usually works well for his pain. Patient ports he has not been taking indomethacin recently. Patient denies other areas of pain or gouty flare. The patient has injury to his toe. The patient denies fevers, chills, injury, or recent illness.  (Consider location/radiation/quality/duration/timing/severity/associated sxs/prior Treatment) HPI  Past Medical History  Diagnosis Date  . Gout   . Acute MI     Reports MI at 19, seen at [facility]    History reviewed. No pertinent past surgical history. History reviewed. No pertinent family history. History  Substance Use Topics  . Smoking status: Never Smoker   . Smokeless tobacco: Never Used  . Alcohol Use: No    Review of Systems  Constitutional: Negative for fever and chills.  Musculoskeletal: Positive for arthralgias.  Skin: Negative for rash and wound.      Allergies  Review of patient's allergies indicates no known allergies.  Home Medications   Prior to Admission medications   Medication Sig Start Date End Date Taking? Authorizing Provider  HYDROcodone-acetaminophen (NORCO/VICODIN) 5-325 MG per tablet Take 1 tablet by mouth every 6 (six) hours as needed for moderate pain. 11/11/13   Charlestine Night, PA-C  indomethacin (INDOCIN) 25 MG capsule Take 1 capsule (25 mg total) by mouth 3 (three) times daily as needed. 08/19/14   Everlene Farrier, PA-C  predniSONE (DELTASONE) 20 MG tablet Take 2 tablets (40 mg total) by mouth daily. 08/19/14   Everlene Farrier, PA-C    BP 126/73 mmHg  Pulse 77  Temp(Src) 98.4 F (36.9 C) (Oral)  Resp 18  Ht 5\' 8"  (1.727 m)  Wt 300 lb (136.079 kg)  BMI 45.63 kg/m2  SpO2 98% Physical Exam  Constitutional: He appears well-developed and well-nourished. No distress.  Nontoxic appearing.  HENT:  Head: Normocephalic and atraumatic.  Eyes: Right eye exhibits no discharge. Left eye exhibits no discharge.  Cardiovascular: Normal rate, regular rhythm and intact distal pulses.   Bilateral posterior tibialis and dorsalis pedis pulses are intact.    Pulmonary/Chest: Effort normal. No respiratory distress.  Musculoskeletal: Normal range of motion. He exhibits tenderness.  Patient has tenderness and a mild amount of erythema to his left great toe. Patient has good range of motion of his left great toe. There is no deformity of his left great toe.  Neurological: He is alert. Coordination normal.  Skin: Skin is warm and dry. No rash noted. He is not diaphoretic. No erythema. No pallor.  Psychiatric: He has a normal mood and affect. His behavior is normal.  Nursing note and vitals reviewed.   ED Course  Procedures (including critical care time) Labs Review Labs Reviewed - No data to display  Imaging Review No results found.   EKG Interpretation None      Filed Vitals:   08/19/14 1335  BP: 126/73  Pulse: 77  Temp: 98.4 F (36.9 C)  TempSrc: Oral  Resp: 18  Height: 5\' 8"  (1.727 m)  Weight: 300 lb (136.079 kg)  SpO2: 98%  MDM   Meds given in ED:  Medications - No data to display  New Prescriptions   INDOMETHACIN (INDOCIN) 25 MG CAPSULE    Take 1 capsule (25 mg total) by mouth 3 (three) times daily as needed.   PREDNISONE (DELTASONE) 20 MG TABLET    Take 2 tablets (40 mg total) by mouth daily.    Final diagnoses:  Acute gout of left foot, unspecified cause   Patient with a left great gout flare since today. Patient has history of same. He reports this pain is similar. He has taken nothing for  treatment today. Patient denies injury to his left great toe. On exam the patient is afebrile and nontoxic appearing. He has a mild amount of erythema and tenderness to the dorsal aspect of his left toe. He has good range of motion of his left toe. There is no deformity noted to his left foot. Will place the patient on indomethacin and a steroid burst. Patient has a primary care provider which I advised him to follow-up with. Education also provided on diet to help prevent gout flares. I advised the patient to follow-up with their primary care provider this week. I advised the patient to return to the emergency department with new or worsening symptoms or new concerns. The patient verbalized understanding and agreement with plan.        Everlene Farrier, PA-C 08/19/14 1426  Donnetta Hutching, MD 08/20/14 1343

## 2014-08-19 NOTE — Discharge Instructions (Signed)

## 2014-12-13 ENCOUNTER — Emergency Department (HOSPITAL_COMMUNITY)
Admission: EM | Admit: 2014-12-13 | Discharge: 2014-12-13 | Disposition: A | Discharge status: Home / Self Care | Payer: 59 | Attending: Emergency Medicine | Admitting: Emergency Medicine

## 2014-12-13 ENCOUNTER — Encounter (HOSPITAL_COMMUNITY): Payer: Self-pay

## 2014-12-13 ENCOUNTER — Emergency Department (HOSPITAL_COMMUNITY): Payer: 59

## 2014-12-13 DIAGNOSIS — I252 Old myocardial infarction: Secondary | ICD-10-CM | POA: Insufficient documentation

## 2014-12-13 DIAGNOSIS — R079 Chest pain, unspecified: Secondary | ICD-10-CM | POA: Insufficient documentation

## 2014-12-13 DIAGNOSIS — Z8739 Personal history of other diseases of the musculoskeletal system and connective tissue: Secondary | ICD-10-CM | POA: Insufficient documentation

## 2014-12-13 DIAGNOSIS — E119 Type 2 diabetes mellitus without complications: Secondary | ICD-10-CM | POA: Insufficient documentation

## 2014-12-13 LAB — CBC
HCT: 40.3 % (ref 39.0–52.0)
Hemoglobin: 13.1 g/dL (ref 13.0–17.0)
MCH: 27.7 pg (ref 26.0–34.0)
MCHC: 32.5 g/dL (ref 30.0–36.0)
MCV: 85.2 fL (ref 78.0–100.0)
PLATELETS: 208 10*3/uL (ref 150–400)
RBC: 4.73 MIL/uL (ref 4.22–5.81)
RDW: 14.1 % (ref 11.5–15.5)
WBC: 7.8 10*3/uL (ref 4.0–10.5)

## 2014-12-13 LAB — BASIC METABOLIC PANEL
Anion gap: 8 (ref 5–15)
BUN: 11 mg/dL (ref 6–20)
CO2: 27 mmol/L (ref 22–32)
CREATININE: 1.09 mg/dL (ref 0.61–1.24)
Calcium: 9.1 mg/dL (ref 8.9–10.3)
Chloride: 103 mmol/L (ref 101–111)
GFR calc Af Amer: >60 mL/min (ref >60)
Glucose, Bld: 94 mg/dL (ref 65–99)
Potassium: 3.7 mmol/L (ref 3.5–5.1)
SODIUM: 138 mmol/L (ref 135–145)

## 2014-12-13 LAB — I-STAT TROPONIN, ED
TROPONIN I, POC: 0.01 ng/mL (ref 0.00–0.08)
Troponin i, poc: 0.01 ng/mL (ref 0.00–0.08)

## 2014-12-13 MED ORDER — GI COCKTAIL ~~LOC~~
30.0000 mL | Freq: Once | ORAL | Status: AC
  Administered 2014-12-13: 30 mL via ORAL
  Filled 2014-12-13: qty 30

## 2014-12-13 NOTE — Discharge Instructions (Signed)
Nonspecific Chest Pain  °Chest pain can be caused by many different conditions. There is always a chance that your pain could be related to something serious, such as a heart attack or a blood clot in your lungs. Chest pain can also be caused by conditions that are not life-threatening. If you have chest pain, it is very important to follow up with your health care provider. °CAUSES  °Chest pain can be caused by: °· Heartburn. °· Pneumonia or bronchitis. °· Anxiety or stress. °· Inflammation around your heart (pericarditis) or lung (pleuritis or pleurisy). °· A blood clot in your lung. °· A collapsed lung (pneumothorax). It can develop suddenly on its own (spontaneous pneumothorax) or from trauma to the chest. °· Shingles infection (varicella-zoster virus). °· Heart attack. °· Damage to the bones, muscles, and cartilage that make up your chest wall. This can include: °¨ Bruised bones due to injury. °¨ Strained muscles or cartilage due to frequent or repeated coughing or overwork. °¨ Fracture to one or more ribs. °¨ Sore cartilage due to inflammation (costochondritis). °RISK FACTORS  °Risk factors for chest pain may include: °· Activities that increase your risk for trauma or injury to your chest. °· Respiratory infections or conditions that cause frequent coughing. °· Medical conditions or overeating that can cause heartburn. °· Heart disease or family history of heart disease. °· Conditions or health behaviors that increase your risk of developing a blood clot. °· Having had chicken pox (varicella zoster). °SIGNS AND SYMPTOMS °Chest pain can feel like: °· Burning or tingling on the surface of your chest or deep in your chest. °· Crushing, pressure, aching, or squeezing pain. °· Dull or sharp pain that is worse when you move, cough, or take a deep breath. °· Pain that is also felt in your back, neck, shoulder, or arm, or pain that spreads to any of these areas. °Your chest pain may come and go, or it may stay  constant. °DIAGNOSIS °Lab tests or other studies may be needed to find the cause of your pain. Your health care provider may have you take a test called an ambulatory ECG (electrocardiogram). An ECG records your heartbeat patterns at the time the test is performed. You may also have other tests, such as: °· Transthoracic echocardiogram (TTE). During echocardiography, sound waves are used to create a picture of all of the heart structures and to look at how blood flows through your heart. °· Transesophageal echocardiogram (TEE). This is a more advanced imaging test that obtains images from inside your body. It allows your health care provider to see your heart in finer detail. °· Cardiac monitoring. This allows your health care provider to monitor your heart rate and rhythm in real time. °· Holter monitor. This is a portable device that records your heartbeat and can help to diagnose abnormal heartbeats. It allows your health care provider to track your heart activity for several days, if needed. °· Stress tests. These can be done through exercise or by taking medicine that makes your heart beat more quickly. °· Blood tests. °· Imaging tests. °TREATMENT  °Your treatment depends on what is causing your chest pain. Treatment may include: °· Medicines. These may include: °¨ Acid blockers for heartburn. °¨ Anti-inflammatory medicine. °¨ Pain medicine for inflammatory conditions. °¨ Antibiotic medicine, if an infection is present. °¨ Medicines to dissolve blood clots. °¨ Medicines to treat coronary artery disease. °· Supportive care for conditions that do not require medicines. This may include: °¨ Resting. °¨ Applying heat   or cold packs to injured areas. °¨ Limiting activities until pain decreases. °HOME CARE INSTRUCTIONS °· If you were prescribed an antibiotic medicine, finish it all even if you start to feel better. °· Avoid any activities that bring on chest pain. °· Do not use any tobacco products, including  cigarettes, chewing tobacco, or electronic cigarettes. If you need help quitting, ask your health care provider. °· Do not drink alcohol. °· Take medicines only as directed by your health care provider. °· Keep all follow-up visits as directed by your health care provider. This is important. This includes any further testing if your chest pain does not go away. °· If heartburn is the cause for your chest pain, you may be told to keep your head raised (elevated) while sleeping. This reduces the chance that acid will go from your stomach into your esophagus. °· Make lifestyle changes as directed by your health care provider. These may include: °¨ Getting regular exercise. Ask your health care provider to suggest some activities that are safe for you. °¨ Eating a heart-healthy diet. A registered dietitian can help you to learn healthy eating options. °¨ Maintaining a healthy weight. °¨ Managing diabetes, if necessary. °¨ Reducing stress. °SEEK MEDICAL CARE IF: °· Your chest pain does not go away after treatment. °· You have a rash with blisters on your chest. °· You have a fever. °SEEK IMMEDIATE MEDICAL CARE IF:  °· Your chest pain is worse. °· You have an increasing cough, or you cough up blood. °· You have severe abdominal pain. °· You have severe weakness. °· You faint. °· You have chills. °· You have sudden, unexplained chest discomfort. °· You have sudden, unexplained discomfort in your arms, back, neck, or jaw. °· You have shortness of breath at any time. °· You suddenly start to sweat, or your skin gets clammy. °· You feel nauseous or you vomit. °· You suddenly feel light-headed or dizzy. °· Your heart begins to beat quickly, or it feels like it is skipping beats. °These symptoms may represent a serious problem that is an emergency. Do not wait to see if the symptoms will go away. Get medical help right away. Call your local emergency services (911 in the [place] Do not drive yourself to the hospital. °  °This  information is not intended to replace advice given to you by your health care provider. Make sure you discuss any questions you have with your health care provider. °  °Document Released: 10/25/2004 Document Revised: 02/05/2014 Document Reviewed: 08/21/2013 °[employer]vier Interactive Patient Education ©2016 [employer] ° °

## 2014-12-13 NOTE — ED Provider Notes (Signed)
CSN: [id number]     Arrival date & time 12/13/14  [id number] History   First MD Initiated Contact with Patient 12/13/14 0451     Chief Complaint  Patient presents with  . Chest Pain     (Consider location/radiation/quality/duration/timing/severity/associated sxs/prior Treatment) HPI  This is a 34 year old with a history of diabetes and reported MI at [age] who presents with chest pain. Patient reports onset of chest pain after eating lasagna last night. He states "I thought it was heartburn." He states that it is burning. He took a Tums with minimal relief. Currently 8 out of 10. No exertional component. Patient reports that he has been evaluated previously for "heart problems." He is unable to further articulate. He denies having any stents. [facility]. He does also report subsequent workup with serial troponins. Denies leg swelling or any risk factors for blood clots.  Past Medical History  Diagnosis Date  . Gout   . Acute MI (HCC)     [facility] at 19[facility]   . Diabetes mellitus without complication (HCC)    History reviewed. No pertinent past surgical history. No family history on file. Social History  Substance Use Topics  . Smoking status: Never Smoker   . Smokeless tobacco: Never Used  . Alcohol Use: No    Review of Systems  Constitutional: Negative.  Negative for fever.  Respiratory: Negative.  Negative for chest tightness and shortness of breath.   Cardiovascular: Positive for chest pain. Negative for leg swelling.  Gastrointestinal: Negative.  Negative for abdominal pain.  Genitourinary: Negative.  Negative for dysuria.  Neurological: Negative for headaches.  All other systems reviewed and are negative.     Allergies  Review of patient's allergies indicates no known allergies.  Home Medications   Prior to Admission medications   Not on File   BP 142/72 mmHg  Pulse 63  Temp(Src) 98.2 F (36.8  C) (Oral)  Resp 21  SpO2 97% Physical Exam  Constitutional: He is oriented to person, place, and time. He appears well-developed and well-nourished.  Obese  HENT:  Head: Normocephalic and atraumatic.  Eyes: Pupils are equal, round, and reactive to light.  Neck: Neck supple.  Cardiovascular: Normal rate, regular rhythm and normal heart sounds.   No murmur heard. Pulmonary/Chest: Effort normal and breath sounds normal. No respiratory distress. He has no wheezes. He exhibits no tenderness.  Abdominal: Soft. Bowel sounds are normal. There is no tenderness. There is no rebound.  Musculoskeletal: He exhibits no edema.  Lymphadenopathy:    He has no cervical adenopathy.  Neurological: He is alert and oriented to person, place, and time.  Skin: Skin is warm and dry.  Psychiatric: He has a normal mood and affect.  Nursing note and vitals reviewed.   ED Course  Procedures (including critical care time) Labs Review Labs Reviewed  BASIC METABOLIC PANEL  CBC  I-STAT TROPOININ, ED  Rosezena SensorI-STAT TROPOININ, ED    Imaging Review Dg Chest 2 View  12/13/2014  CLINICAL DATA:  Centralized and LEFT chest pain beginning tonight. History of myocardial infarction and diabetes. EXAM: CHEST  2 VIEW COMPARISON:  Chest radiograph April 02, 2013 FINDINGS: Cardiomediastinal silhouette is normal. Mild bronchitic changes. The lungs are clear without pleural effusions or focal consolidations. Trachea projects midline and there is no pneumothorax. Soft tissue planes and included osseous structures are non-suspicious. Large body habitus. IMPRESSION: Mild bronchitic changes without focal  consolidation. Electronically Signed   By: Awilda Metro M.D.   On: 12/13/2014 05:28   I have personally reviewed and evaluated these images and lab results as part of my medical decision-making.   EKG Interpretation   Date/Time:  Monday December 13 2014 04:44:20 EST Ventricular Rate:  67 PR Interval:  201 QRS Duration:  87 QT Interval:  369 QTC Calculation: 389 R Axis:   71 Text Interpretation:  Sinus rhythm Borderline T abnormalities, inferior  leads No significant change since last tracing Confirmed by Jasher Barkan  MD,  Joyceann Kruser ([id number]) on 12/13/2014 5:55:20 AM      MDM   Final diagnoses:  Chest pain, unspecified chest pain type    Patient presents with chest pain. Very atypical for ACS. Reports a "heart history" and does have risk factor; however, EKG is nonischemic and history is not suggestive of ACS. More suggestive of GI origin. Actual "heart history" is unclear. Patient was given GI cocktail with some improvement of symptoms.  Chest x-ray shows mild bronchitis which I do not have concern for clinically. Discussed with patient staying for repeat troponin.  If negative follow-up closely with cardiology. Signed out to Dr. Clarice Pole.    Shon Baton, MD 12/13/14 [phone]

## 2014-12-13 NOTE — ED Notes (Signed)
Per pt he started having chest pain last night with shortness of breath. Pt rates the pain 8/10. He states that he did not take anything for the pain. Pt is not diaphoretic.

## 2015-01-30 DIAGNOSIS — K5792 Diverticulitis of intestine, part unspecified, without perforation or abscess without bleeding: Secondary | ICD-10-CM

## 2015-01-30 HISTORY — DX: Diverticulitis of intestine, part unspecified, without perforation or abscess without bleeding: K57.92

## 2015-03-07 ENCOUNTER — Encounter (HOSPITAL_COMMUNITY): Payer: Self-pay | Admitting: *Deleted

## 2015-03-07 ENCOUNTER — Emergency Department (HOSPITAL_COMMUNITY): Payer: BLUE CROSS/BLUE SHIELD

## 2015-03-07 ENCOUNTER — Emergency Department (HOSPITAL_COMMUNITY)
Admission: EM | Admit: 2015-03-07 | Discharge: 2015-03-07 | Disposition: A | Discharge status: Home / Self Care | Payer: BLUE CROSS/BLUE SHIELD | Attending: Emergency Medicine | Admitting: Emergency Medicine

## 2015-03-07 DIAGNOSIS — S4992XA Unspecified injury of left shoulder and upper arm, initial encounter: Secondary | ICD-10-CM | POA: Insufficient documentation

## 2015-03-07 DIAGNOSIS — Y9389 Activity, other specified: Secondary | ICD-10-CM | POA: Diagnosis not present

## 2015-03-07 DIAGNOSIS — I252 Old myocardial infarction: Secondary | ICD-10-CM | POA: Diagnosis not present

## 2015-03-07 DIAGNOSIS — S29001A Unspecified injury of muscle and tendon of front wall of thorax, initial encounter: Secondary | ICD-10-CM | POA: Diagnosis not present

## 2015-03-07 DIAGNOSIS — Y998 Other external cause status: Secondary | ICD-10-CM | POA: Insufficient documentation

## 2015-03-07 DIAGNOSIS — R0781 Pleurodynia: Secondary | ICD-10-CM

## 2015-03-07 DIAGNOSIS — W108XXA Fall (on) (from) other stairs and steps, initial encounter: Secondary | ICD-10-CM | POA: Insufficient documentation

## 2015-03-07 DIAGNOSIS — Y9289 Other specified places as the place of occurrence of the external cause: Secondary | ICD-10-CM | POA: Insufficient documentation

## 2015-03-07 DIAGNOSIS — E119 Type 2 diabetes mellitus without complications: Secondary | ICD-10-CM | POA: Diagnosis not present

## 2015-03-07 DIAGNOSIS — M25512 Pain in left shoulder: Secondary | ICD-10-CM

## 2015-03-07 DIAGNOSIS — M549 Dorsalgia, unspecified: Secondary | ICD-10-CM

## 2015-03-07 DIAGNOSIS — S3992XA Unspecified injury of lower back, initial encounter: Secondary | ICD-10-CM | POA: Insufficient documentation

## 2015-03-07 DIAGNOSIS — W19XXXA Unspecified fall, initial encounter: Secondary | ICD-10-CM

## 2015-03-07 MED ORDER — IBUPROFEN 800 MG PO TABS: 800.0000 mg | ORAL_TABLET | Freq: Three times a day (TID) | ORAL | Status: DC

## 2015-03-07 MED ORDER — ACETAMINOPHEN 325 MG PO TABS
650.0000 mg | ORAL_TABLET | Freq: Once | ORAL | Status: AC
  Administered 2015-03-07: 650 mg via ORAL
  Filled 2015-03-07: qty 2

## 2015-03-07 MED ORDER — METHOCARBAMOL 500 MG PO TABS: 500.0000 mg | ORAL_TABLET | Freq: Two times a day (BID) | ORAL | Status: DC

## 2015-03-07 NOTE — Discharge Instructions (Signed)
1. Medications: ibuprofen, robaxin, usual home medications 2. Treatment: rest, drink plenty of fluids 3. Follow Up: please followup with your primary doctor for discussion of your diagnoses and further evaluation after today's visit; if you do not have a primary care doctor use the resource guide provided to find one; please return to the ER for increased pain, numbness, weakness, new or worsening symptoms   Musculoskeletal Pain Musculoskeletal pain is muscle and boney aches and pains. These pains can occur in any part of the body. Your caregiver may treat you without knowing the cause of the pain. They may treat you if blood or urine tests, X-rays, and other tests were normal.  CAUSES There is often not a definite cause or reason for these pains. These pains may be caused by a type of germ (virus). The discomfort may also come from overuse. Overuse includes working out too hard when your body is not fit. Boney aches also come from weather changes. Bone is sensitive to atmospheric pressure changes. HOME CARE INSTRUCTIONS   Ask when your test results will be ready. Make sure you get your test results.  Only take over-the-counter or prescription medicines for pain, discomfort, or fever as directed by your caregiver. If you were given medications for your condition, do not drive, operate machinery or power tools, or sign legal documents for 24 hours. Do not drink alcohol. Do not take sleeping pills or other medications that may interfere with treatment.  Continue all activities unless the activities cause more pain. When the pain lessens, slowly resume normal activities. Gradually increase the intensity and duration of the activities or exercise.  During periods of severe pain, bed rest may be helpful. Lay or sit in any position that is comfortable.  Putting ice on the injured area.  Put ice in a bag.  Place a towel between your skin and the bag.  Leave the ice on for 15 to 20 minutes, 3 to 4  times a day.  Follow up with your caregiver for continued problems and no reason can be found for the pain. If the pain becomes worse or does not go away, it may be necessary to repeat tests or do additional testing. Your caregiver may need to look further for a possible cause. SEEK IMMEDIATE MEDICAL CARE IF:  You have pain that is getting worse and is not relieved by medications.  You develop chest pain that is associated with shortness or breath, sweating, feeling sick to your stomach (nauseous), or throw up (vomit).  Your pain becomes localized to the abdomen.  You develop any new symptoms that seem different or that concern you. MAKE SURE YOU:   Understand these instructions.  Will watch your condition.  Will get help right away if you are not doing well or get worse.   This information is not intended to replace advice given to you by your health care provider. Make sure you discuss any questions you have with your health care provider.   Document Released: 01/15/2005 Document Revised: 04/09/2011 Document Reviewed: 09/19/2012 Elsevier Interactive Patient Education 2016 [employer].   Emergency Department Resource Guide 1) Find a Doctor and Pay Out of Pocket Although you won't have to find out who is covered by your insurance plan, it is a good idea to ask around and get recommendations. You will then need to call the office and see if the doctor you have chosen will accept you as a new patient and what types of options they offer for  patients who are self-pay. Some doctors offer discounts or will set up payment plans for their patients who do not have insurance, but you will need to ask so you aren't surprised when you get to your appointment.  2) Contact Your Local Health Department Not all health departments have doctors that can see patients for sick visits, but many do, so it is worth a call to see if yours does. If you don't know where your local health department is, you  can check in your phone book. The CDC also has a tool to help you locate your state's health department, and many state websites also have listings of all of their local health departments.  3) Find a Walk-in Clinic If your illness is not likely to be very severe or complicated, you may want to try a walk in clinic. These are popping up all over the country in pharmacies, drugstores, and shopping centers. They're usually staffed by nurse practitioners or physician assistants that have been trained to treat common illnesses and complaints. They're usually fairly quick and inexpensive. However, if you have serious medical issues or chronic medical problems, these are probably not your best option.  No Primary Care Doctor: - Call Health Connect at  [phone] - they can help you locate a primary care doctor that  accepts your insurance, provides certain services, etc. - Physician Referral Service- [phone]  Chronic Pain Problems: Organization         Address  Phone   Notes  [facility]  [phone] Patients need to be referred by their primary care doctor.   Medication Assistance: Organization         Address  Phone   Notes  [facility] Medication [facility] [address] [place], [place] [address] [phone] --Must be a resident of [facility] -- Must have NO insurance coverage whatsoever (no Medicaid/ Medicare, etc.) -- The pt. MUST have a primary care doctor that directs their care regularly and follows them in the community   [facility]  [phone]   [employer]  [phone]    Agencies that provide inexpensive medical care: Organization         Address  Phone   Notes  [facility]  [phone]   [facility]    [phone]   [facility] [address] [place], [place] [address] [phone]   [facility] [address] [place] [place] [phone]   Planned Parenthood    [phone]   [facility]    [phone]   [facility]  [address], [place] Phone:  [phone], Fax:  [phone] Hours of Operation:  9 am - 6 pm, M-F.  Also accepts Medicaid/Medicare and self-pay.  [facility]  [address], [place] Phone: [phone], Fax: [phone]. Hours of Operation:  8:30 am - 5:30 pm, M-F.  Also accepts Medicaid and self-pay.  [facility] [place] [address] [place] Phone: [phone]   [facility] [address] Natasha Bence [place], [place] [phone], Ext. [phone] Mondays & Thursdays: 7-9 AM.  First 15 patients are seen on a first come, first serve basis.    Medicaid-accepting [facility] Providers:  Organization         Address  Phone   Notes  [facility] [address], [place] [phone] Also accepts self-pay patients.  [facility] [address] Laurell Josephs [place], [place]  [phone]   [facility] [address], [place] [phone]   [facility] [address] [place] [phone]   Renaye Rakers [address], [place]   [phone] Only accepts [place] Access [place] patients after they have their name applied to their card.   Self-Pay (no insurance) in [facility] [place] [facility]:  Organization         Address  Phone   Notes  Sickle Cell Patients, [facility] [address] [place], [place] [phone]   [facility] [address] [place], [place] [phone]   [facility] [place]  [address], [place] [phone]   [facility]/Dr. Osei-Bonsu  [address] [place] or [address], [place] [phone] Phone number for both [place] and [place] locations  is the same.  Urgent Medical and [facility] [address] [place] [phone]   [facility] [address] [place] or [address] [phone]   [facility] [address], [place] [phone], phone; [phone], fax Sees patients 1st and 3rd Saturday of every month.  Must not qualify for public or private insurance (i.e. Medicaid, Medicare, [place] Health Choice, Veterans' Benefits)  Household income should be no more than 200% of the poverty level The clinic cannot treat you if you are pregnant or think you are pregnant  Sexually transmitted diseases are not treated at the clinic.    Dental Care: Organization         Address  Phone  Notes  [facility] Department of [facility] [address] [place], [place] [phone] Accepts children up to age 27 who are enrolled in [place] or [place] Health Choice; pregnant women with a Medicaid card; and children who have applied for Medicaid or [place] Health Choice, but were declined, whose parents can pay a reduced fee at time of service.  [facility] Department of [facility]  [address], [place] [phone] Accepts children up to age 96 who are enrolled in [place] or [place] Health Choice; pregnant women with a Medicaid card; and children who have applied for Medicaid or [place] Health Choice, but were declined, whose parents can pay a reduced fee at time of service.  [facility]  [address] [place], [place] [phone] Patients are seen by appointment only. Walk-ins are not accepted. [facility] will see patients 34 years of age and older. Monday - Tuesday (8am-5pm) Most Wednesdays (8:30-5pm) $30 per visit, cash only  [facility] Adult Dental Access PROGRAM  [address], [facility] [phone] Patients are seen by appointment only. Walk-ins are not accepted. [facility] will see  patients 36 years of age and older. One Wednesday Evening (Monthly: Volunteer Based).  $30 per visit, cash only  [employer]  [phone] for adults; Children under age 73, call Graduate Pediatric Dentistry at [phone]. Children aged 44-14, please call [phone] to request a pediatric application.  Dental services are provided in all areas of dental care including fillings, crowns and bridges, complete and partial dentures, implants, gum treatment,  root canals, and extractions. Preventive care is also provided. Treatment is provided to both adults and children. Patients are selected via a lottery and there is often a waiting list.   [facility] [address], [place]  [phone] [email or web address]   [facility] [address] [place], [place] [phone], Ext. [phone] Second and Fourth Thursday of each month, opens at 6:30 AM; Clinic ends at 9 AM.  Patients are seen on a first-come first-served basis, and a limited number are seen during each clinic.   [facility]  [address] Hillard Danker [place], [place] [phone]   Eligibility Requirements You must have lived in [place], [place], or [place] counties for at least the last three months.   You cannot be eligible for state or federal sponsored [employer], including [employer], [place], or [employer].   You generally cannot be eligible for healthcare insurance through your employer.    How to apply: Eligibility screenings are held every Tuesday and Wednesday afternoon from 1:00 pm until 4:00 pm. You do not need an appointment for the interview!  [facility] [address] [place], [place]   [place]  [place] Department  [place]  [phone]    Behavioral Health Resources in the Community: Intensive Outpatient  Programs Organization         Address  Phone  Notes  [place] [place]. [address] [place], [place] [phone]   [facility] Outpatient [address] [place], [place]   [facility]: [facility] [address] [place], [place]   [place] [address],  [place], [place] or [phone]   Substance Abuse Resources Organization         Address  Phone  Notes  [facility] Drug Services  [phone]   [place]  [phone]   [facility] [place]   Chinita Pester  [phone]   Residential & Outpatient Substance Abuse Program  [phone]   Psychological Services Organization         Address  Phone  Notes  [facility]  [place]  [phone]   [place] [address], [place] or [phone]    Mobile Crisis Teams Organization         Address  Phone  Notes  [facility], Mobile Crisis Care Unit  [phone]   [facility]  [address] [place], [place]   [place] Levels [address] [place] [phone]    Self-Help/Support Groups Organization         Address  Phone             Notes  [place]. of [place] - variety of support groups  [place] Call for more information  Narcotics Anonymous (NA), [facility] [address], [employer] [place]  2 meetings at this location   Special educational needs teacher         Address  Phone  Notes  [facility] [place],    [place]  [place]  [address] [place], [place], [place]   [place], [place] [phone] Admissions: 8am-3pm M-F  Incentives Substance [place]  [address],    [place], [place]  [phone]   [facility] [address] [place], [place], [place]   [facility] [address],  [place], [place]   [facility] - Intensive Outpatient [address] Kristeen Mans [address], [place], [place]   [facility] [place].,  [place], [place] [phone] or [phone]   [facility] ([facility]) [address], [place], [place] Accepts Medicaid  [facility] [place] [address]  [place] [phone] Substance Abuse/Addiction Treatment   [facility] Organization         Address  Phone  Notes  [facility]  [phone]   Domenic Schwab, PhD [address] Arlis Porta [place], [place]   [phone] or [phone]   [place], [place] [phone]   [facility] [address], [place], [place] [phone] Insurance/Medicaid/sponsorship through [facility] and Families [address], [address]                                    [place], [place] [phone] [place] [address] [place] [address], [place] [phone]    Dr. Adele Schilder  [phone]   [facility] Dept. 1) [address] [place] 2) [place] 3)  [place] [address], [place] [phone]  [phone]   [place] [phone] or [phone] (After Hours)

## 2015-03-07 NOTE — ED Notes (Signed)
Declined W/C at D/C and was escorted to lobby by RN. 

## 2015-03-07 NOTE — ED Notes (Signed)
Pt reports last night he missed a step and fell down 15 indoor steps. Pt now has low back pain ,shoulder pain. Pt denies any LOC and did not hit head.

## 2015-03-07 NOTE — ED Provider Notes (Signed)
CSN: [id number]     Arrival date & time 03/07/15  0716 History   First MD Initiated Contact with Patient 03/07/15 [phone]     Chief Complaint  Patient presents with  . Back Pain  . Shoulder Pain    HPI   Leonard Pierce is a 35 y.o. male with a PMH of MI, gout who presents to the ED with left rib pain, left shoulder pain, and low back pain after slipping and falling down a staircase last night. He reports his symptoms are constant. He states movement exacerbates his pain. He has not tried anything for symptom relief. He reports he may have hit his head, though denies LOC, headache, dizziness, lightheadedness, vision changes, neck pain, numbness, weakness, paresthesia, bowel or bladder incontinence, saddle anesthesia.   Past Medical History  Diagnosis Date  . Gout   . Acute MI (HCC)     Reports MI at 19, [facility] at [facility]   . Diabetes mellitus without complication (HCC)    No past surgical history on file. No family history on file. Social History  Substance Use Topics  . Smoking status: Never Smoker   . Smokeless tobacco: Never Used  . Alcohol Use: No     Review of Systems  Eyes: Negative for visual disturbance.  Musculoskeletal: Positive for back pain and arthralgias.  Neurological: Negative for dizziness, syncope, weakness, light-headedness, numbness and headaches.      Allergies  Review of patient's allergies indicates no known allergies.  Home Medications   Prior to Admission medications   Medication Sig Start Date End Date Taking? Authorizing Provider  ibuprofen (ADVIL,MOTRIN) 800 MG tablet Take 1 tablet (800 mg total) by mouth 3 (three) times daily. 03/07/15   Mady Gemma, PA-C  methocarbamol (ROBAXIN) 500 MG tablet Take 1 tablet (500 mg total) by mouth 2 (two) times daily. 03/07/15   Mady Gemma, PA-C    BP 133/81 mmHg  Pulse 67  Temp(Src) 98.1 F (36.7 C) (Oral)  Resp 20  Ht  (1.702 m)  Wt 151.048 kg  BMI 52.14 kg/m2  SpO2 98% Physical Exam   Constitutional: He is oriented to person, place, and time. He appears well-developed and well-nourished. No distress.  HENT:  Head: Normocephalic and atraumatic. Head is without raccoon's eyes, without Battle's sign, without abrasion, without contusion and without laceration.  Right Ear: External ear normal.  Left Ear: External ear normal.  Nose: Nose normal.  Mouth/Throat: Uvula is midline, oropharynx is clear and moist and mucous membranes are normal.  Mild TTP to left anterior scalp. No hematoma. No palpable deformity.  Eyes: Conjunctivae and EOM are normal. Pupils are equal, round, and reactive to light. Right eye exhibits no discharge. Left eye exhibits no discharge. No scleral icterus.  Neck: Normal range of motion. Neck supple.  Cardiovascular: Normal rate, regular rhythm and intact distal pulses.   Pulmonary/Chest: Effort normal and breath sounds normal. No respiratory distress. He has no wheezes. He has no rales. He exhibits tenderness.  TTP of left inferior ribs.  Musculoskeletal: He exhibits tenderness. He exhibits no edema.  TTP of left anterior shoulder with decreased ROM due to pain. Strength and sensation intact. Distal pulses intact. TTP of lumbar spine and paraspinal muscles. No palpable step-off or deformity.  Neurological: He is alert and oriented to person, place, and time. He has normal strength. No cranial nerve deficit or sensory deficit. GCS eye subscore is 4. GCS verbal subscore is 5. GCS motor subscore is 6.  Skin: Skin is warm and dry. He is not diaphoretic.  Psychiatric: He has a normal mood and affect. His behavior is normal.  Nursing note and vitals reviewed.   ED Course  Procedures (including critical care time)  Labs Review Labs Reviewed - No data to display  Imaging Review Dg Ribs Unilateral W/chest Left  03/07/2015  CLINICAL DATA:  Pain following fall EXAM: LEFT RIBS AND CHEST - 3+ VIEW COMPARISON:  Chest radiograph December 13, 2014 FINDINGS: Frontal  chest as well as oblique and cone-down lower rib images were obtained. Lungs are clear. Heart size and pulmonary vascularity are normal. No adenopathy. There is no demonstrable pneumothorax or effusion. No fractures are evident. IMPRESSION: No demonstrable rib fracture.  Lungs clear. Electronically Signed   By: Bretta Bang III M.D.   On: 03/07/2015 08:20   Dg Lumbar Spine Complete  03/07/2015  CLINICAL DATA:  Status post fall down stairs last night with a low back injury and pain. Initial encounter. EXAM: LUMBAR SPINE - COMPLETE 4+ VIEW COMPARISON:  CT abdomen and pelvis 12/02/2012. FINDINGS: There is no evidence of lumbar spine fracture. Alignment is normal. Intervertebral disc spaces are maintained. IMPRESSION: Negative exam. Electronically Signed   By: Drusilla Kanner M.D.   On: 03/07/2015 08:18   Dg Shoulder Left  03/07/2015  CLINICAL DATA:  Injury, fell down stairs, pain top of left shoulder. EXAM: LEFT SHOULDER - 2+ VIEW COMPARISON:  Left shoulder plain film dated 12/15/2012. FINDINGS: Osseous alignment is normal. Bone mineralization is normal. No fracture line or displaced fracture fragment seen. Soft tissues about the left shoulder are unremarkable. Visualized left upper ribs appear intact. IMPRESSION: Negative. Electronically Signed   By: Bary Richard M.D.   On: 03/07/2015 08:18     I have personally reviewed and evaluated these images as part of my medical decision-making.   EKG Interpretation None      MDM   Final diagnoses:  Fall, initial encounter  Left shoulder pain  Rib pain on left side  Back pain, unspecified location    35 year old male presents with left shoulder pain, left rib pain, and back pain s/p fall last night. Patient is afebrile. Vital signs stable. TTP of left inferior ribs. TTP of left anterior shoulder with decreased ROM due to pain. Strength and sensation intact. Distal pulses intact. TTP of lumbar spine and paraspinal muscles. No palpable step-off or  deformity. GCS 15. Normal neuro exam with no focal deficit. Patient ambulates without difficulty. Imaging of left shoulder, chest/ribs, and lumbar spine negative for acute abnormality. Canadian head CT rule negative, do not feel further imaging is indicated at this time. Patient is non-toxic and well-appearing, feel he is stable for discharge at this time. Will treat with ibuprofen and robaxin. Patient to follow-up with PCP. Return precautions discussed. Patient verbalizes his understanding and is in agreement with plan.  BP 133/81 mmHg  Pulse 67  Temp(Src) 98.1 F (36.7 C) (Oral)  Resp 20  Ht  (1.702 m)  Wt 151.048 kg  BMI 52.14 kg/m2  SpO2 98%     Mady Gemma, PA-C 03/07/15 1638  Raeford Razor, MD 03/08/15 [phone]

## 2015-09-02 ENCOUNTER — Encounter: Payer: Self-pay | Admitting: Emergency Medicine

## 2015-09-02 ENCOUNTER — Inpatient Hospital Stay
Admission: EM | Admit: 2015-09-02 | Discharge: 2015-09-05 | DRG: 392 | Disposition: A | Discharge status: Home / Self Care | Payer: BLUE CROSS/BLUE SHIELD | Attending: Surgery | Admitting: Surgery

## 2015-09-02 ENCOUNTER — Emergency Department: Payer: BLUE CROSS/BLUE SHIELD

## 2015-09-02 DIAGNOSIS — M109 Gout, unspecified: Secondary | ICD-10-CM | POA: Diagnosis present

## 2015-09-02 DIAGNOSIS — K5732 Diverticulitis of large intestine without perforation or abscess without bleeding: Secondary | ICD-10-CM | POA: Insufficient documentation

## 2015-09-02 DIAGNOSIS — I252 Old myocardial infarction: Secondary | ICD-10-CM

## 2015-09-02 DIAGNOSIS — D72829 Elevated white blood cell count, unspecified: Secondary | ICD-10-CM

## 2015-09-02 DIAGNOSIS — K5720 Diverticulitis of large intestine with perforation and abscess without bleeding: Principal | ICD-10-CM | POA: Diagnosis present

## 2015-09-02 DIAGNOSIS — K668 Other specified disorders of peritoneum: Secondary | ICD-10-CM | POA: Diagnosis present

## 2015-09-02 LAB — CBC
HEMATOCRIT: 40.6 % (ref 40.0–52.0)
Hemoglobin: 13.5 g/dL (ref 13.0–18.0)
MCH: 27.8 pg (ref 26.0–34.0)
MCHC: 33.2 g/dL (ref 32.0–36.0)
MCV: 83.8 fL (ref 80.0–100.0)
Platelets: 199 10*3/uL (ref 150–440)
RBC: 4.84 MIL/uL (ref 4.40–5.90)
RDW: 13.9 % (ref 11.5–14.5)
WBC: 15.3 10*3/uL — ABNORMAL HIGH (ref 3.8–10.6)

## 2015-09-02 LAB — COMPREHENSIVE METABOLIC PANEL
ALBUMIN: 4 g/dL (ref 3.5–5.0)
ALT: 23 U/L (ref 17–63)
AST: 25 U/L (ref 15–41)
Alkaline Phosphatase: 58 U/L (ref 38–126)
Anion gap: 7 (ref 5–15)
BILIRUBIN TOTAL: 1.3 mg/dL — AB (ref 0.3–1.2)
BUN: 11 mg/dL (ref 6–20)
CHLORIDE: 102 mmol/L (ref 101–111)
CO2: 27 mmol/L (ref 22–32)
Calcium: 9.3 mg/dL (ref 8.9–10.3)
Creatinine, Ser: 1.13 mg/dL (ref 0.61–1.24)
GFR calc Af Amer: >60 mL/min (ref >60)
GFR calc non Af Amer: >60 mL/min (ref >60)
GLUCOSE: 149 mg/dL — AB (ref 65–99)
POTASSIUM: 3.5 mmol/L (ref 3.5–5.1)
SODIUM: 136 mmol/L (ref 135–145)
Total Protein: 8.1 g/dL (ref 6.5–8.1)

## 2015-09-02 LAB — URINALYSIS COMPLETE WITH MICROSCOPIC (ARMC ONLY)
BACTERIA UA: NONE SEEN
BILIRUBIN URINE: NEGATIVE
GLUCOSE, UA: NEGATIVE mg/dL
Ketones, ur: NEGATIVE mg/dL
Leukocytes, UA: NEGATIVE
Nitrite: NEGATIVE
Protein, ur: NEGATIVE mg/dL
SQUAMOUS EPITHELIAL / LPF: NONE SEEN
Specific Gravity, Urine: 1.006 (ref 1.005–1.030)
pH: 6 (ref 5.0–8.0)

## 2015-09-02 LAB — LIPASE, BLOOD: Lipase: 22 U/L (ref 11–51)

## 2015-09-02 MED ORDER — IOPAMIDOL (ISOVUE-370) INJECTION 76%
100.0000 mL | Freq: Once | INTRAVENOUS | Status: AC | PRN
  Administered 2015-09-02: 100 mL via INTRAVENOUS

## 2015-09-02 MED ORDER — ACETAMINOPHEN 650 MG RE SUPP: 650.0000 mg | Freq: Four times a day (QID) | RECTAL | Status: DC | PRN

## 2015-09-02 MED ORDER — FAMOTIDINE IN NACL 20-0.9 MG/50ML-% IV SOLN
20.0000 mg | Freq: Two times a day (BID) | INTRAVENOUS | Status: DC
  Administered 2015-09-03 – 2015-09-04 (×5): 20 mg via INTRAVENOUS
  Filled 2015-09-02 (×6): qty 50

## 2015-09-02 MED ORDER — HYDROCODONE-ACETAMINOPHEN 5-325 MG PO TABS
1.0000 | ORAL_TABLET | ORAL | Status: DC | PRN
  Administered 2015-09-03 – 2015-09-04 (×6): 2 via ORAL
  Filled 2015-09-02 (×7): qty 2

## 2015-09-02 MED ORDER — DIATRIZOATE MEGLUMINE & SODIUM 66-10 % PO SOLN
15.0000 mL | Freq: Once | ORAL | Status: AC
  Administered 2015-09-02: 15 mL via ORAL

## 2015-09-02 MED ORDER — FENTANYL CITRATE (PF) 100 MCG/2ML IJ SOLN
100.0000 ug | INTRAMUSCULAR | Status: DC | PRN
  Administered 2015-09-02: 100 ug via INTRAVENOUS
  Filled 2015-09-02: qty 2

## 2015-09-02 MED ORDER — PIPERACILLIN-TAZOBACTAM 3.375 G IVPB
3.3750 g | Freq: Three times a day (TID) | INTRAVENOUS | Status: DC
  Administered 2015-09-03 – 2015-09-04 (×5): 3.375 g via INTRAVENOUS
  Filled 2015-09-02 (×6): qty 50

## 2015-09-02 MED ORDER — KCL IN DEXTROSE-NACL 20-5-0.45 MEQ/L-%-% IV SOLN
INTRAVENOUS | Status: DC
  Administered 2015-09-03 – 2015-09-05 (×5): via INTRAVENOUS
  Filled 2015-09-02 (×6): qty 1000

## 2015-09-02 MED ORDER — ONDANSETRON 4 MG PO TBDP: 4.0000 mg | ORAL_TABLET | Freq: Four times a day (QID) | ORAL | Status: DC | PRN

## 2015-09-02 MED ORDER — ONDANSETRON HCL 4 MG/2ML IJ SOLN: 4.0000 mg | Freq: Four times a day (QID) | INTRAMUSCULAR | Status: DC | PRN

## 2015-09-02 MED ORDER — ACETAMINOPHEN 325 MG PO TABS: 650.0000 mg | ORAL_TABLET | Freq: Four times a day (QID) | ORAL | Status: DC | PRN

## 2015-09-02 MED ORDER — HYDROMORPHONE HCL 1 MG/ML IJ SOLN
1.0000 mg | INTRAMUSCULAR | Status: DC | PRN
  Administered 2015-09-02 – 2015-09-04 (×4): 1 mg via INTRAVENOUS
  Filled 2015-09-02 (×4): qty 1

## 2015-09-02 MED ORDER — ENOXAPARIN SODIUM 40 MG/0.4ML ~~LOC~~ SOLN
40.0000 mg | SUBCUTANEOUS | Status: DC
  Administered 2015-09-03 – 2015-09-04 (×3): 40 mg via SUBCUTANEOUS
  Filled 2015-09-02 (×3): qty 0.4

## 2015-09-02 NOTE — ED Triage Notes (Signed)
Pt states left lower quadrant pain and right lower quadrant pain with diarrhea that began 2 days pta. Pt complains of chills, no nausea or vomiting. Pt complains of pain radiating down right anterior leg.

## 2015-09-02 NOTE — ED Provider Notes (Signed)
[facility] Emergency Department Provider Note    First MD Initiated Contact with Patient 09/02/15 2031     (approximate)  I have reviewed the triage vital signs and the nursing notes.   HISTORY  Chief Complaint Abdominal Pain    HPI Leonard Pierce is a 35 y.o. male with no previous surgical history presents with 2 days of worsening left lower quadrant pain associated with profuse loose stools and chills. Denies any nausea or vomiting. Patient states that the pain is sharp in nature and currently a 10 out of 10. He's never had this type of pain before.  Denies any dysuria. States the pain will radiate down his left testicle denies any hematuria or flank pain.   Past Medical History:  Diagnosis Date  . Acute MI (HCC)    Reports MI at 19, seen at [facility]   . Gout     There are no active problems to display for this patient.   No past surgical history on file.  Prior to Admission medications   Medication Sig Start Date End Date Taking? Authorizing Provider  ibuprofen (ADVIL,MOTRIN) 800 MG tablet Take 1 tablet (800 mg total) by mouth 3 (three) times daily. 03/07/15   Mady Gemma, PA-C  methocarbamol (ROBAXIN) 500 MG tablet Take 1 tablet (500 mg total) by mouth 2 (two) times daily. 03/07/15   Mady Gemma, PA-C    Allergies Review of patient's allergies indicates no known allergies.  No family history on file.  Social History Social History  Substance Use Topics  . Smoking status: Never Smoker  . Smokeless tobacco: Never Used  . Alcohol use No    Review of Systems Patient denies headaches, rhinorrhea, blurry vision, numbness, shortness of breath, chest pain, edema, cough, abdominal pain, nausea, vomiting, diarrhea, dysuria, fevers, rashes or hallucinations unless otherwise stated above in HPI. ____________________________________________   PHYSICAL EXAM:  VITAL SIGNS: Vitals:   09/02/15 1918  BP: (!) 164/83  Pulse: 96  Resp:  16  Temp: 99.9 F (37.7 C)    Constitutional: uncomfortable appearing Eyes: Conjunctivae are normal. PERRL. EOMI. Head: Atraumatic. Nose: No congestion/rhinnorhea. Mouth/Throat: Mucous membranes are moist.  Oropharynx non-erythematous. Neck: No stridor. Painless ROM. No cervical spine tenderness to palpation Hematological/Lymphatic/Immunilogical: No cervical lymphadenopathy. Cardiovascular: Normal rate, regular rhythm. Grossly normal heart sounds.  Good peripheral circulation. Respiratory: Normal respiratory effort.  No retractions. Lungs CTAB. Gastrointestinal: Soft and LLQ ttp with involuntary guarding. No distention. No abdominal bruits. No CVA tenderness. Gu: normal cremasteric reflex. No testicular mass or pain Musculoskeletal: No lower extremity tenderness nor edema.  No joint effusions. Neurologic:  Normal speech and language. No gross focal neurologic deficits are appreciated. No gait instability. Skin:  Skin is warm, dry and intact. No rash noted.  ____________________________________________   LABS (all labs ordered are listed, but only abnormal results are displayed)  Results for orders placed or performed during the hospital encounter of 09/02/15 (from the past 24 hour(s))  Lipase, blood     Status: None   Collection Time: 09/02/15  7:18 PM  Result Value Ref Range   Lipase 22 11 - 51 U/L  Comprehensive metabolic panel     Status: Abnormal   Collection Time: 09/02/15  7:18 PM  Result Value Ref Range   Sodium 136 135 - 145 mmol/L   Potassium 3.5 3.5 - 5.1 mmol/L   Chloride 102 101 - 111 mmol/L   CO2 27 22 - 32 mmol/L   Glucose, Bld  149 (H) 65 - 99 mg/dL   BUN 11 6 - 20 mg/dL   Creatinine, Ser 8.29 0.61 - 1.24 mg/dL   Calcium 9.3 8.9 - 56.2 mg/dL   Total Protein 8.1 6.5 - 8.1 g/dL   Albumin 4.0 3.5 - 5.0 g/dL   AST 25 15 - 41 U/L   ALT 23 17 - 63 U/L   Alkaline Phosphatase 58 38 - 126 U/L   Total Bilirubin 1.3 (H) 0.3 - 1.2 mg/dL   GFR calc non Af Amer >60 >60  mL/min   GFR calc Af Amer >60 >60 mL/min   Anion gap 7 5 - 15  CBC     Status: Abnormal   Collection Time: 09/02/15  7:18 PM  Result Value Ref Range   WBC 15.3 (H) 3.8 - 10.6 K/uL   RBC 4.84 4.40 - 5.90 MIL/uL   Hemoglobin 13.5 13.0 - 18.0 g/dL   HCT 13.0 86.5 - 78.4 %   MCV 83.8 80.0 - 100.0 fL   MCH 27.8 26.0 - 34.0 pg   MCHC 33.2 32.0 - 36.0 g/dL   RDW 69.6 29.5 - 28.4 %   Platelets 199 150 - 440 K/uL  Urinalysis complete, with microscopic     Status: Abnormal   Collection Time: 09/02/15  7:21 PM  Result Value Ref Range   Color, Urine YELLOW (A) YELLOW   APPearance CLEAR (A) CLEAR   Glucose, UA NEGATIVE NEGATIVE mg/dL   Bilirubin Urine NEGATIVE NEGATIVE   Ketones, ur NEGATIVE NEGATIVE mg/dL   Specific Gravity, Urine 1.006 1.005 - 1.030   Hgb urine dipstick 1+ (A) NEGATIVE   pH 6.0 5.0 - 8.0   Protein, ur NEGATIVE NEGATIVE mg/dL   Nitrite NEGATIVE NEGATIVE   Leukocytes, UA NEGATIVE NEGATIVE   RBC / HPF 0-5 0 - 5 RBC/hpf   WBC, UA 0-5 0 - 5 WBC/hpf   Bacteria, UA NONE SEEN NONE SEEN   Squamous Epithelial / LPF NONE SEEN NONE SEEN   ____________________________________________  EKG ____________________________________________  RADIOLOGY  IMPRESSION: 1. Acute diverticulitis involving the proximal sigmoid colon with small foci of extraluminal air consist with micro perforation. No abscess. 2. Mesenteric haziness and prominent mesenteric lymph nodes are unchanged from exam 3 years prior. This favors a reactive or inflammatory etiology. 3. Hepatic steatosis. These results were called by telephone at the time of interpretation on 09/02/2015 at 9:24 pm to Dr. Willy Eddy , who verbally acknowledged these results. ____________________________________________   PROCEDURES  Procedure(s) performed: none    Critical Care performed: no ____________________________________________   INITIAL IMPRESSION / ASSESSMENT AND PLAN / ED COURSE  Pertinent labs & imaging  results that were available during my care of the patient were reviewed by me and considered in my medical decision making (see chart for details).  DDX: Appendicitis, diverticulitis, enteritis, testicular torsion, suicidal strain, status, hernia  Leonard Pierce is a 35 y.o. who presents to the ED with acute left lower quadrant pain. Abdominal exam is tender with mild guarding. Exam not consistent with testicular pathology. No CVA tenderness to palpation therefore a lower suspicion for stone. Patient had laboratory testing ordered in triage due to concern for acute intra-abdominal process. He does have an acute leukocytosis and given his pain with diarrhea and concern for acute intra-abdominal process such as diverticulitis or abscess. Will order CT scan in addition to IV pain medication due to concern for acute process.  Clinical Course   10:14 PM CT imaging with evidence of microperforation  diverticulitis. There is a patient's acuity stenosis and CT imaging of microperforation and acute intra-abdominal pain general surgery's consult for operative management. Dr. Michela Pitcher who agrees to admit patient for further evaluation and management.  Have discussed with the patient and available family all diagnostics and treatments performed thus far and all questions were answered to the best of my ability. The patient demonstrates understanding and agreement with plan.    ____________________________________________   FINAL CLINICAL IMPRESSION(S) / ED DIAGNOSES  Final diagnoses:  Pneumoperitoneum  Diverticulitis of large intestine with perforation without bleeding  Leukocytosis      NEW MEDICATIONS STARTED DURING THIS VISIT:  New Prescriptions   No medications on file     Note:  This document was prepared using Dragon voice recognition software and may include unintentional dictation errors.    Willy Eddy, MD 09/02/15 2215

## 2015-09-02 NOTE — H&P (Signed)
Leonard Pierce is a 35 y.o. male  2 day history of lower quadrant abdominal pain.  HPI: He is evaluated in the emergency room with 2 day history of abdominal pain. He denies any similar symptoms in the past. The pain came on suddenly primarily in the left lower quadrant suprapubic area has been constant since that time. He denies any crampy abdominal pain. He has significant fever yesterday. He's been mildly nauseated but has not vomited. He is voiding normally and spontaneously. He's not had a problem with his bowel habits.  Specifically he denies history of hepatitis, yellow jaundice, pancreatitis, peptic ulcer disease, gallbladder disease, previous diagnosis of diverticulitis. His chart carries a history of acute myocardial infarction at age of 5 which she currently denies. Specifically denies any history of chest pain cardiac disease palpitations hypertension diabetes or thyroid problems. He's had no previous surgery. He does have history of gout. He is known medical allergies. He does not smoke cigarettes.  Workup in the emergency room revealed slightly elevated white blood cell count of 15,000. Contrasted CT scan demonstrated what appeared to be a microperforation of a sigmoid diverticulitis. The surgical service was consulted.  Past Medical History:  Diagnosis Date  . Acute MI (HCC)    Reports MI at 19, seen at [facility]   . Gout    No past surgical history on file. Social History   Social History  . Marital status: Single    Spouse name: N/A  . Number of children: N/A  . Years of education: N/A   Social History Main Topics  . Smoking status: Never Smoker  . Smokeless tobacco: Never Used  . Alcohol use No  . Drug use: No  . Sexual activity: Not Asked   Other Topics Concern  . None   Social History Narrative  . None     Review of Systems  Constitutional: Positive for fever. Negative for chills and weight loss.  HENT: Negative.   Eyes: Negative.   Respiratory: Negative.    Cardiovascular: Negative.   Gastrointestinal: Positive for abdominal pain and nausea. Negative for constipation, diarrhea, heartburn and vomiting.  Genitourinary: Negative.   Musculoskeletal: Negative.   Skin: Negative.   Neurological: Negative.   Psychiatric/Behavioral: Negative.      PHYSICAL EXAM: BP (!) 164/83   Pulse 96   Temp 99.9 F (37.7 C) (Oral)   Resp 16   Ht 5\' 8"  (1.727 m)   Wt (!) 149.7 kg (330 lb)   SpO2 98%   BMI 50.18 kg/m   Physical Exam  Constitutional: He is oriented to person, place, and time. No distress.  Morbidly obese  HENT:  Head: Normocephalic and atraumatic.  Eyes: EOM are normal. Pupils are equal, round, and reactive to light.  Neck: Normal range of motion. Neck supple.  Cardiovascular: Normal rate and regular rhythm.   Pulmonary/Chest: Effort normal and breath sounds normal. No respiratory distress. He has no wheezes.  Abdominal: Soft. He exhibits no distension and no mass. There is tenderness. There is guarding. There is no rebound. No hernia.  Musculoskeletal: Normal range of motion. He exhibits no edema, tenderness or deformity.  Neurological: He is alert and oriented to person, place, and time.  Skin: Skin is warm and dry. He is not diaphoretic.  Psychiatric: He has a normal mood and affect. His behavior is normal. Thought content normal.   His abdomen is markedly tender in the suprapubic area and left lower quadrant. He is some mild guarding without rebound. I  cannot palpate a mass.  Impression/Plan: I have independently reviewed his CT scan. He does appear to have a microperforation of the sigmoid colon in the area of what appears to be diverticulitis. I've compared to his previous CT scan for several years ago. He did have some diverticulosis but no evidence of any infection.  We discussed options for intervention. I would recommend surgical admission IV antibiotics and follow up. The present time I do not see any surgical indications. He  is in agreement with this plan although is not happy about being admitted to the hospital.   Tiney Rouge III, MD  09/02/2015, 9:57 PM

## 2015-09-03 LAB — BASIC METABOLIC PANEL
ANION GAP: 6 (ref 5–15)
BUN: 10 mg/dL (ref 6–20)
CALCIUM: 8.7 mg/dL — AB (ref 8.9–10.3)
CO2: 27 mmol/L (ref 22–32)
Chloride: 104 mmol/L (ref 101–111)
Creatinine, Ser: 1.05 mg/dL (ref 0.61–1.24)
Glucose, Bld: 145 mg/dL — ABNORMAL HIGH (ref 65–99)
Potassium: 3.5 mmol/L (ref 3.5–5.1)
SODIUM: 137 mmol/L (ref 135–145)

## 2015-09-03 LAB — CBC
HCT: 37.7 % — ABNORMAL LOW (ref 40.0–52.0)
HEMOGLOBIN: 12.6 g/dL — AB (ref 13.0–18.0)
MCH: 28 pg (ref 26.0–34.0)
MCHC: 33.5 g/dL (ref 32.0–36.0)
MCV: 83.7 fL (ref 80.0–100.0)
PLATELETS: 181 10*3/uL (ref 150–440)
RBC: 4.5 MIL/uL (ref 4.40–5.90)
RDW: 14.4 % (ref 11.5–14.5)
WBC: 15.8 10*3/uL — AB (ref 3.8–10.6)

## 2015-09-03 MED ORDER — DIPHENHYDRAMINE HCL 25 MG PO CAPS
25.0000 mg | ORAL_CAPSULE | Freq: Four times a day (QID) | ORAL | Status: DC | PRN
  Administered 2015-09-03: 25 mg via ORAL
  Filled 2015-09-03: qty 1

## 2015-09-03 NOTE — Progress Notes (Signed)
CC: Diverticulitis Subjective: Patient admitted overnight with a contained perforated diverticulitis. Patient reports she doesn't feel much different. Continues to have a crampy abdominal pain in the left lower quadrant. He denies any fevers, chills, nausea, vomiting.  Objective: Vital signs in last 24 hours: Temp:  [98.4 F (36.9 C)-99.9 F (37.7 C)] 98.6 F (37 C) (08/05 0426) Pulse Rate:  [81-97] 81 (08/05 0426) Resp:  [16-20] 20 (08/05 0426) BP: (121-169)/(63-92) 121/63 (08/05 0426) SpO2:  [94 %-98 %] 94 % (08/05 0426) Weight:  [149.4 kg (329 lb 4.8 oz)-149.7 kg (330 lb)] 149.4 kg (329 lb 4.8 oz) (08/04 2230) Last BM Date: 09/02/15  Intake/Output from previous day: 08/04 0701 - 08/05 0700 In: 775 [P.O.:240; I.V.:497; IV Piggyback:38] Out: 900 [Urine:900] Intake/Output this shift: Total I/O In: -  Out: 425 [Urine:425]  Physical exam:  Gen.: No acute distress Chest: Clear to auscultation Heart: Regular rate and rhythm Abdomen: Soft, nondistended, mildly tender to deep palpation in the left lower quadrant. No evidence of peritonitis, rebound, guarding.  Lab Results: CBC   Recent Labs  09/02/15 1918 09/03/15 0451  WBC 15.3* 15.8*  HGB 13.5 12.6*  HCT 40.6 37.7*  PLT 199 181   BMET  Recent Labs  09/02/15 1918 09/03/15 0451  NA 136 137  K 3.5 3.5  CL 102 104  CO2 27 27  GLUCOSE 149* 145*  BUN 11 10  CREATININE 1.13 1.05  CALCIUM 9.3 8.7*   PT/INR No results for input(s): LABPROT, INR in the last 72 hours. ABG No results for input(s): PHART, HCO3 in the last 72 hours.  Invalid input(s): PCO2, PO2  Studies/Results: Ct Abdomen Pelvis W Contrast  Result Date: 09/02/2015 CLINICAL DATA:  Acute left lower quadrant pain with profuse diarrhea. Also right lower quadrant pain radiating down right leg. EXAM: CT ABDOMEN AND PELVIS WITH CONTRAST TECHNIQUE: Multidetector CT imaging of the abdomen and pelvis was performed using the standard protocol following bolus  administration of intravenous contrast. CONTRAST:  100 cc Isovue 370 IV COMPARISON:  CT 05/09/2012 FINDINGS: Lower chest: Linear atelectasis in both lower lobes. No pleural fluid. Liver: Decreased density consistent with steatosis. No focal lesion. Hepatobiliary: Gallbladder physiologically distended, no calcified stone. No biliary dilatation. Pancreas: No ductal dilatation or inflammation. Spleen: Normal. Adrenal glands: No nodule. Kidneys: Symmetric renal enhancement. No hydronephrosis. No perinephric edema. Stomach/Bowel: Stomach physiologically distended. There are no dilated or thickened small bowel loops. Acute diverticulitis involving the proximal sigmoid colon with adjacent colonic wall thickening. There are tiny foci of adjacent extraluminal air consistent with micro perforation. No abscess or focal fluid collection. Small amount of free fluid tracks and pericolic gutter and in the pelvis. The appendix is normal. Vascular/Lymphatic: Mesenteric haziness with prominent mesenteric lymph nodes is unchanged from prior CT. Abdominal aorta is normal in caliber. Circumaortic left renal vein. Mesenteric vessels are patent. Reproductive: No acute abnormality. Bladder: Physiologically distended without wall thickening. Other: No tracking free air outside of the sigmoid colonic micro perforation. No intra-abdominal abscess. No upper abdominal ascites. Musculoskeletal: There are no acute or suspicious osseous abnormalities. IMPRESSION: 1. Acute diverticulitis involving the proximal sigmoid colon with small foci of extraluminal air consist with micro perforation. No abscess. 2. Mesenteric haziness and prominent mesenteric lymph nodes are unchanged from exam 3 years prior. This favors a reactive or inflammatory etiology. 3. Hepatic steatosis. These results were called by telephone at the time of interpretation on 09/02/2015 at 9:24 pm to Dr. Willy Eddy , who verbally acknowledged these results. Electronically  Signed    By: Rubye Oaks M.D.   On: 09/02/2015 21:25    Anti-infectives: Anti-infectives    Start     Dose/Rate Route Frequency Ordered Stop   09/02/15 2200  piperacillin-tazobactam (ZOSYN) IVPB 3.375 g     3.375 g 12.5 mL/hr over 240 Minutes Intravenous Every 8 hours 09/02/15 2156        Assessment/Plan:  35 year old male admitted with perforated diverticulitis. Continues to be tender but has only had 2 doses of IV antibiotics at this time. We will follow closely. Encourage ambulation, and symptoms from her usage. Again counseled the patient and his significant other that should he fail to improve or worsen he may still yet require an emergent operation which would likely also involve an ostomy. Patient voiced understanding.  Kayleanna Lorman T. Tonita Cong, MD, FACS  09/03/2015

## 2015-09-04 LAB — CBC
HEMATOCRIT: 37.1 % — AB (ref 40.0–52.0)
Hemoglobin: 12.4 g/dL — ABNORMAL LOW (ref 13.0–18.0)
MCH: 28.1 pg (ref 26.0–34.0)
MCHC: 33.3 g/dL (ref 32.0–36.0)
MCV: 84.2 fL (ref 80.0–100.0)
PLATELETS: 182 10*3/uL (ref 150–440)
RBC: 4.4 MIL/uL (ref 4.40–5.90)
RDW: 14.1 % (ref 11.5–14.5)
WBC: 13.2 10*3/uL — ABNORMAL HIGH (ref 3.8–10.6)

## 2015-09-04 LAB — BASIC METABOLIC PANEL
Anion gap: 7 (ref 5–15)
BUN: 7 mg/dL (ref 6–20)
CO2: 27 mmol/L (ref 22–32)
CREATININE: 1.08 mg/dL (ref 0.61–1.24)
Calcium: 9 mg/dL (ref 8.9–10.3)
Chloride: 106 mmol/L (ref 101–111)
GFR calc Af Amer: >60 mL/min (ref >60)
GFR calc non Af Amer: >60 mL/min (ref >60)
GLUCOSE: 103 mg/dL — AB (ref 65–99)
Potassium: 3.6 mmol/L (ref 3.5–5.1)
Sodium: 140 mmol/L (ref 135–145)

## 2015-09-04 MED ORDER — AMOXICILLIN-POT CLAVULANATE 875-125 MG PO TABS: 1.0000 | ORAL_TABLET | Freq: Two times a day (BID) | ORAL | 0 refills | Status: DC

## 2015-09-04 MED ORDER — AMOXICILLIN-POT CLAVULANATE 875-125 MG PO TABS
1.0000 | ORAL_TABLET | Freq: Two times a day (BID) | ORAL | Status: DC
  Administered 2015-09-04 – 2015-09-05 (×3): 1 via ORAL
  Filled 2015-09-04 (×3): qty 1

## 2015-09-04 NOTE — Discharge Summary (Signed)
Patient ID: Leonard Pierce MRN: [id number] DOB/AGE: Jun 13, 1980 35 y.o.  Admit date: 09/02/2015 Discharge date: 09/05/2015  Discharge Diagnoses:  Diverticulitis  Procedures Performed: None  Discharged Condition: good  Hospital Course: Patient admitted with perforated diverticulitis that was contained. He required IV antibiotics. The time of discharge he was tolerating a regular diet and having normal bowel function. He is being treated with only oral medications.  Discharge Orders:  discharge home  Disposition: 01-Home or Self Care  Discharge Medications:   Medication List    TAKE these medications   amoxicillin-clavulanate 875-125 MG tablet Commonly known as:  AUGMENTIN Take 1 tablet by mouth every 12 (twelve) hours.        Follwup: Follow-up Information    [facility] [place]. Schedule an appointment as soon as possible for a visit in 1 week(s).   Specialty:  General Surgery Why:  follow up diverticulitis Contact information: [address], [address] [place] [phone]          Signed: Ricarda FrameCharles Renea Schoonmaker 09/04/2015, 5:49 PM

## 2015-09-04 NOTE — Discharge Instructions (Signed)
Diverticulitis °Diverticulitis is inflammation or infection of small pouches in your colon that form when you have a condition called diverticulosis. The pouches in your colon are called diverticula. Your colon, or large intestine, is where water is absorbed and stool is formed. °Complications of diverticulitis can include: °· Bleeding. °· Severe infection. °· Severe pain. °· Perforation of your colon. °· Obstruction of your colon. °CAUSES  °Diverticulitis is caused by bacteria. °Diverticulitis happens when stool becomes trapped in diverticula. This allows bacteria to grow in the diverticula, which can lead to inflammation and infection. °RISK FACTORS °People with diverticulosis are at risk for diverticulitis. Eating a diet that does not include enough fiber from fruits and vegetables may make diverticulitis more likely to develop. °SYMPTOMS  °Symptoms of diverticulitis may include: °· Abdominal pain and tenderness. The pain is normally located on the left side of the abdomen, but may occur in other areas. °· Fever and chills. °· Bloating. °· Cramping. °· Nausea. °· Vomiting. °· Constipation. °· Diarrhea. °· Blood in your stool. °DIAGNOSIS  °Your health care provider will ask you about your medical history and do a physical exam. You may need to have tests done because many medical conditions can cause the same symptoms as diverticulitis. Tests may include: °· Blood tests. °· Urine tests. °· Imaging tests of the abdomen, including X-rays and CT scans. °When your condition is under control, your health care provider may recommend that you have a colonoscopy. A colonoscopy can show how severe your diverticula are and whether something else is causing your symptoms. °TREATMENT  °Most cases of diverticulitis are mild and can be treated at home. Treatment may include: °· Taking over-the-counter pain medicines. °· Following a clear liquid diet. °· Taking antibiotic medicines by mouth for 7-10 days. °More severe cases may  be treated at a hospital. Treatment may include: °· Not eating or drinking. °· Taking prescription pain medicine. °· Receiving antibiotic medicines through an IV tube. °· Receiving fluids and nutrition through an IV tube. °· Surgery. °HOME CARE INSTRUCTIONS  °· Follow your health care provider's instructions carefully. °· Follow a full liquid diet or other diet as directed by your health care provider. After your symptoms improve, your health care provider may tell you to change your diet. He or she may recommend you eat a high-fiber diet. Fruits and vegetables are good sources of fiber. Fiber makes it easier to pass stool. °· Take fiber supplements or probiotics as directed by your health care provider. °· Only take medicines as directed by your health care provider. °· Keep all your follow-up appointments. °SEEK MEDICAL CARE IF:  °· Your pain does not improve. °· You have a hard time eating food. °· Your bowel movements do not return to normal. °SEEK IMMEDIATE MEDICAL CARE IF:  °· Your pain becomes worse. °· Your symptoms do not get better. °· Your symptoms suddenly get worse. °· You have a fever. °· You have repeated vomiting. °· You have bloody or black, tarry stools. °MAKE SURE YOU:  °· Understand these instructions. °· Will watch your condition. °· Will get help right away if you are not doing well or get worse. °  °This information is not intended to replace advice given to you by your health care provider. Make sure you discuss any questions you have with your health care provider. °  °Document Released: 10/25/2004 Document Revised: 01/20/2013 Document Reviewed: 12/10/2012 °[employer]vier Interactive Patient Education ©2016 [employer] ° °

## 2015-09-04 NOTE — Progress Notes (Signed)
CC: Diverticulitis Subjective: Patient reports feeling much better this morning. Denies any pain. States he is hungry and would like something more solid eat. He has had a normal bowel movement.  Objective: Vital signs in last 24 hours: Temp:  [98.3 F (36.8 C)-98.6 F (37 C)] 98.3 F (36.8 C) (08/06 0541) Pulse Rate:  [84-88] 85 (08/06 0541) Resp:  [19-20] 20 (08/06 0541) BP: (125-146)/(68-82) 146/82 (08/06 0541) SpO2:  [95 %-100 %] 100 % (08/06 0541) Last BM Date: 09/03/15  Intake/Output from previous day: 08/05 0701 - 08/06 0700 In: 1770.8 [P.O.:540; I.V.:1230.8] Out: 2425 [Urine:2425] Intake/Output this shift: Total I/O In: -  Out: 200 [Urine:200]  Physical exam:  Gen.: No acute distress Chest: Clear to auscultation Heart: Regular rate and rhythm Abdomen: Soft, nontender, nondistended  Lab Results: CBC   Recent Labs  09/03/15 0451 09/04/15 0435  WBC 15.8* 13.2*  HGB 12.6* 12.4*  HCT 37.7* 37.1*  PLT 181 182   BMET  Recent Labs  09/03/15 0451 09/04/15 0435  NA 137 140  K 3.5 3.6  CL 104 106  CO2 27 27  GLUCOSE 145* 103*  BUN 10 7  CREATININE 1.05 1.08  CALCIUM 8.7* 9.0   PT/INR No results for input(s): LABPROT, INR in the last 72 hours. ABG No results for input(s): PHART, HCO3 in the last 72 hours.  Invalid input(s): PCO2, PO2  Studies/Results: Ct Abdomen Pelvis W Contrast  Result Date: 09/02/2015 CLINICAL DATA:  Acute left lower quadrant pain with profuse diarrhea. Also right lower quadrant pain radiating down right leg. EXAM: CT ABDOMEN AND PELVIS WITH CONTRAST TECHNIQUE: Multidetector CT imaging of the abdomen and pelvis was performed using the standard protocol following bolus administration of intravenous contrast. CONTRAST:  100 cc Isovue 370 IV COMPARISON:  CT 05/09/2012 FINDINGS: Lower chest: Linear atelectasis in both lower lobes. No pleural fluid. Liver: Decreased density consistent with steatosis. No focal lesion. Hepatobiliary:  Gallbladder physiologically distended, no calcified stone. No biliary dilatation. Pancreas: No ductal dilatation or inflammation. Spleen: Normal. Adrenal glands: No nodule. Kidneys: Symmetric renal enhancement. No hydronephrosis. No perinephric edema. Stomach/Bowel: Stomach physiologically distended. There are no dilated or thickened small bowel loops. Acute diverticulitis involving the proximal sigmoid colon with adjacent colonic wall thickening. There are tiny foci of adjacent extraluminal air consistent with micro perforation. No abscess or focal fluid collection. Small amount of free fluid tracks and pericolic gutter and in the pelvis. The appendix is normal. Vascular/Lymphatic: Mesenteric haziness with prominent mesenteric lymph nodes is unchanged from prior CT. Abdominal aorta is normal in caliber. Circumaortic left renal vein. Mesenteric vessels are patent. Reproductive: No acute abnormality. Bladder: Physiologically distended without wall thickening. Other: No tracking free air outside of the sigmoid colonic micro perforation. No intra-abdominal abscess. No upper abdominal ascites. Musculoskeletal: There are no acute or suspicious osseous abnormalities. IMPRESSION: 1. Acute diverticulitis involving the proximal sigmoid colon with small foci of extraluminal air consist with micro perforation. No abscess. 2. Mesenteric haziness and prominent mesenteric lymph nodes are unchanged from exam 3 years prior. This favors a reactive or inflammatory etiology. 3. Hepatic steatosis. These results were called by telephone at the time of interpretation on 09/02/2015 at 9:24 pm to Dr. Willy Eddy , who verbally acknowledged these results. Electronically Signed   By: Rubye Oaks M.D.   On: 09/02/2015 21:25    Anti-infectives: Anti-infectives    Start     Dose/Rate Route Frequency Ordered Stop   09/02/15 2200  piperacillin-tazobactam (ZOSYN) IVPB 3.375 g  3.375 g 12.5 mL/hr over 240 Minutes Intravenous  Every 8 hours 09/02/15 2156        Assessment/Plan:  35 year old male with diverticulitis with a contained rupture. Has clinically improved. Plan to transition to oral antibiotics and advance his diet today. If he continues to improve his rate he'll be able be discharged home in the morning. Encourage ambulation and incentive spirometer usage.  Kameron Blethen T. Tonita CongWoodham, MD, FACS  09/04/2015

## 2015-09-05 LAB — CBC
HCT: 39.4 % — ABNORMAL LOW (ref 40.0–52.0)
HEMOGLOBIN: 13.1 g/dL (ref 13.0–18.0)
MCH: 28 pg (ref 26.0–34.0)
MCHC: 33.3 g/dL (ref 32.0–36.0)
MCV: 84.1 fL (ref 80.0–100.0)
PLATELETS: 221 10*3/uL (ref 150–440)
RBC: 4.68 MIL/uL (ref 4.40–5.90)
RDW: 14.1 % (ref 11.5–14.5)
WBC: 9.6 10*3/uL (ref 3.8–10.6)

## 2015-09-05 MED ORDER — KETOROLAC TROMETHAMINE 30 MG/ML IJ SOLN
30.0000 mg | Freq: Four times a day (QID) | INTRAMUSCULAR | Status: DC | PRN
  Administered 2015-09-05: 30 mg via INTRAVENOUS
  Filled 2015-09-05: qty 1

## 2015-09-05 MED ORDER — COLCHICINE 0.6 MG PO TABS
0.6000 mg | ORAL_TABLET | Freq: Once | ORAL | Status: AC
  Administered 2015-09-05: 0.6 mg via ORAL
  Filled 2015-09-05: qty 1

## 2015-09-05 MED ORDER — FAMOTIDINE 20 MG PO TABS
20.0000 mg | ORAL_TABLET | Freq: Two times a day (BID) | ORAL | Status: DC
  Administered 2015-09-05: 20 mg via ORAL
  Filled 2015-09-05: qty 1

## 2015-09-05 NOTE — Progress Notes (Signed)
Notified Dr Everlene FarrierPabon of pt request for gout medication, orders taken for toradol.

## 2015-09-05 NOTE — Progress Notes (Signed)
Key Points: Use following P&T approved IV to PO antibiotic change policy. PHARMACIST - PHYSICIAN COMMUNICATION  CONCERNING: IV to Oral Route Change Policy  RECOMMENDATION: This patient is receiving famotidine by the intravenous route.  Based on criteria approved by the Pharmacy and Therapeutics Committee, the intravenous medication(s) is/are being converted to the equivalent oral dose form(s).   DESCRIPTION: These criteria include:  The patient is eating (either orally or via tube) and/or has been taking other orally administered medications for a least 24 hours  The patient has no evidence of active gastrointestinal bleeding or impaired GI absorption (gastrectomy, short bowel, patient on TNA or NPO).  If you have questions about this conversion, please contact the Pharmacy Department  []   [phone]( [phone] )  [facility] [x]   [phone]( [phone] )  [facility] []   [phone]( [phone] )  [facility] []   [phone]( [phone] )  [facility] []   [phone]( [phone] )  [facility]   Marty HeckWang, Basma Buchner L, [facility] CenterRPH 09/05/2015 7:38 AM

## 2015-09-05 NOTE — Discharge Summary (Signed)
Physician Discharge Summary  Patient ID: Leonard CampionJason A Pierce MRN: [id number] DOB/AGE: 37-27-1982 37 y.o.  Admit date: 09/02/2015 Discharge date: 09/05/2015   Discharge Diagnoses:  Active Problems:   Diverticulitis large intestine   Procedures:None  Hospital Course: This a patient admitted through the emergency room with a diagnosis of perforated acute diverticulitis with microperforation. He was started on IV antibiotics and promptly resolved his pain he's feeling much better now and has been transitioned to oral Augmentin to be discharged on oral Augmentin and a soft diet to follow-up in Dr. Talmage CoinWoodham's office in 2 weeks. It is suggested that he return should any of his pain worsen while on oral antibiotics otherwise he can return to work in 10 days. After seeing Dr. Tonita CongWoodham. On the day of discharge he had a gout attack and has been restarted on his normal gout medication with instructions to continue that at home as usual.  Consults: None  Disposition: 01-Home or Self Care     Medication List    TAKE these medications   amoxicillin-clavulanate 875-125 MG tablet Commonly known as:  AUGMENTIN Take 1 tablet by mouth every 12 (twelve) hours.      Follow-up Information    [facility] [place]. Schedule an appointment as soon as possible for a visit in 1 week(s).   Specialty:  General Surgery Why:  follow up diverticulitis Contact information: [address], [address] [place] [id number] [phone]          Lattie Hawichard E Yahsir Wickens, MD, FACS

## 2015-09-05 NOTE — Progress Notes (Signed)
CC: Left lower quadrant pain Subjective: This patient with left lower quadrant pain which is markedly improved he's been diagnosed with microperforation and acute diverticulitis. He's feeling much better although he has had a left side foot or toe gout exacerbation that started last night and was started on his usual collection seen today he feels better today. Denies nausea vomiting fevers or chills thinks he is being discharged today  Objective: Vital signs in last 24 hours: Temp:  [98.1 F (36.7 C)-98.6 F (37 C)] 98.3 F (36.8 C) (08/07 1244) Pulse Rate:  [77-81] 78 (08/07 1244) Resp:  [18-19] 18 (08/07 1244) BP: (134-155)/(71-97) 134/71 (08/07 1244) SpO2:  [100 %] 100 % (08/07 1244) Last BM Date: 09/02/15  Intake/Output from previous day: 08/06 0701 - 08/07 0700 In: 1530 [P.O.:1380] Out: 1450 [Urine:1450] Intake/Output this shift: Total I/O In: 240 [P.O.:240] Out: -   Physical exam:  Awake alert oriented soft nontender abdomen vital signs are stable afebrile nontender calves swollen left great toe  Lab Results: CBC   Recent Labs  09/04/15 0435 09/05/15 0530  WBC 13.2* 9.6  HGB 12.4* 13.1  HCT 37.1* 39.4*  PLT 182 221   BMET  Recent Labs  09/03/15 0451 09/04/15 0435  NA 137 140  K 3.5 3.6  CL 104 106  CO2 27 27  GLUCOSE 145* 103*  BUN 10 7  CREATININE 1.05 1.08  CALCIUM 8.7* 9.0   PT/INR No results for input(s): LABPROT, INR in the last 72 hours. ABG No results for input(s): PHART, HCO3 in the last 72 hours.  Invalid input(s): PCO2, PO2  Studies/Results: No results found.  Anti-infectives: Anti-infectives    Start     Dose/Rate Route Frequency Ordered Stop   09/04/15 1000  amoxicillin-clavulanate (AUGMENTIN) 875-125 MG per tablet 1 tablet     1 tablet Oral Every 12 hours 09/04/15 0921     09/04/15 0000  amoxicillin-clavulanate (AUGMENTIN) 875-125 MG tablet     1 tablet Oral Every 12 hours 09/04/15 0921     09/02/15 2200   piperacillin-tazobactam (ZOSYN) IVPB 3.375 g  Status:  Discontinued     3.375 g 12.5 mL/hr over 240 Minutes Intravenous Every 8 hours 09/02/15 2156 09/04/15 [id number]      Assessment/Plan:  This patient with acute diverticulitis with microperforation he is much improved he has had a gout attack and has been started on his home medication of cold seen which she will continue at home he wants to be discharged today and will be discharged on Augmentin to follow-up with Dr. Tonita CongWoodham in 2 weeks.  Lattie Hawichard E Alysiana Ethridge, MD, FACS  09/05/2015

## 2015-09-05 NOTE — Progress Notes (Signed)
Pt d/c to home today. IV removed intact.  Rx's given to pt w/all questions and concerns addressed.  D/C paperwork reviewed and education provided with all questions and concerns addressed.  Pt family at bedside for home transport.   

## 2015-09-13 ENCOUNTER — Telehealth: Payer: Self-pay

## 2015-09-13 NOTE — Telephone Encounter (Signed)
Called patient and left a voicemail to return my call.

## 2015-09-14 ENCOUNTER — Other Ambulatory Visit: Payer: Self-pay

## 2015-09-14 ENCOUNTER — Telehealth: Payer: Self-pay

## 2015-09-14 ENCOUNTER — Ambulatory Visit (INDEPENDENT_AMBULATORY_CARE_PROVIDER_SITE_OTHER): Payer: BLUE CROSS/BLUE SHIELD | Admitting: Surgery

## 2015-09-14 ENCOUNTER — Encounter: Payer: Self-pay | Admitting: Surgery

## 2015-09-14 VITALS — BP 147/79 | HR 77 | Temp 99.0°F | Ht 68.0 in | Wt 341.0 lb

## 2015-09-14 DIAGNOSIS — K5720 Diverticulitis of large intestine with perforation and abscess without bleeding: Secondary | ICD-10-CM

## 2015-09-14 NOTE — Telephone Encounter (Signed)
Patient returned my call. I asked if he was still out of work and he stated that he was. I then told patient that once Dr. Orvis BrillLoflin finished her notes from today that I would look at the return to work date so I could fill out his disability form and then fax it. Patient understood.

## 2015-09-14 NOTE — Progress Notes (Signed)
35 year old male who recently had diverticulitis with a contained perforation found on 84. The patient has been taking antibiotics for the past 7 days and states that his been gradually getting better. Patient states that he still fatigued and gets tired easily however that the pain in his abdomen is much improved. Patient states that he does occasionally have some pain twinges here and there but no intense pain or consistent pain. Patient states that he has been having some diarrhea after eating about 10 minutes after eating a meal that he's been also been having some epigastric pain a little bit indigestion. Patient denies any frank fevers or any nausea vomiting or any blood in his stools but he has endorsed some sweating.  Vitals:   09/14/15 1345  BP: (!) 147/79  Pulse: 77  Temp: 99 F (37.2 C)   PE:  Gen: NAD Res: CTAB/L  Cardio: RRR Abd: soft, obese, tenderness in Epigastrium and mild tenderness in LLQ Ext: no edema  CBC Latest Ref Rng & Units 09/05/2015 09/04/2015 09/03/2015  WBC 3.8 - 10.6 K/uL 9.6 13.2(H) 15.8(H)  Hemoglobin 13.0 - 18.0 g/dL 40.913.1 12.4(L) 12.6(L)  Hematocrit 40.0 - 52.0 % 39.4(L) 37.1(L) 37.7(L)  Platelets 150 - 440 K/uL 221 182 181   CMP Latest Ref Rng & Units 09/04/2015 09/03/2015 09/02/2015  Glucose 65 - 99 mg/dL 811(B103(H) 147(W145(H) 295(A149(H)  BUN 6 - 20 mg/dL 7 10 11   Creatinine 0.61 - 1.24 mg/dL 2.131.08 0.861.05 5.781.13  Sodium 135 - 145 mmol/L 140 137 136  Potassium 3.5 - 5.1 mmol/L 3.6 3.5 3.5  Chloride 101 - 111 mmol/L 106 104 102  CO2 22 - 32 mmol/L 27 27 27   Calcium 8.9 - 10.3 mg/dL 9.0 4.6(N8.7(L) 9.3  Total Protein 6.5 - 8.1 g/dL - - 8.1  Total Bilirubin 0.3 - 1.2 mg/dL - - 1.3(H)  Alkaline Phos 38 - 126 U/L - - 58  AST 15 - 41 U/L - - 25  ALT 17 - 63 U/L - - 23   A/P:  Patient is recovering from a bout of contained perforated diverticulitis with antibiotics.   The patient that he should have some fatigued in that this will gradually improve. Patient is only having some mild  twinges of pain which are not constant and he has been instructed if he were to start having the intense pain there any nausea vomiting repeat fevers that he should go back to the emergency department. The patient expressed understanding of this. The patient is having some epigastric pain likely having some gastritis associated with medications that he's taking to help with the pain I did give him some samples of Dexilant and to help with this.  The patient was given a work excuse note that he can return on Monday. I will have him follow-up with Dr. Tonita CongWoodham in one week to ensure that he has continued improvement. Also discussed the patient will need colonoscopy in about 2 months. The patient has been given an appointment with Dr. Servando SnareWohl for this to be done in November.

## 2015-09-14 NOTE — Telephone Encounter (Signed)
Disability Form was filled out and faxed. 

## 2015-09-14 NOTE — Patient Instructions (Signed)
We would like for you to try Prilosec or Nexium over the counter. You cab purchase this at any drug store or [facility]. Please see your follow up appointment for Dr.Woodham listed below.

## 2015-09-15 ENCOUNTER — Telehealth: Payer: Self-pay

## 2015-09-15 NOTE — Telephone Encounter (Signed)
Patient came in to the office and wanted us to fix his return to work letter. He also brought an additional form for me to fill out. I told him to please take a seat and I would fill it out for him.

## 2015-09-19 ENCOUNTER — Ambulatory Visit (INDEPENDENT_AMBULATORY_CARE_PROVIDER_SITE_OTHER): Payer: BLUE CROSS/BLUE SHIELD | Admitting: General Surgery

## 2015-09-19 ENCOUNTER — Other Ambulatory Visit
Admission: RE | Admit: 2015-09-19 | Discharge: 2015-09-19 | Disposition: A | Discharge status: Home / Self Care | Payer: BLUE CROSS/BLUE SHIELD | Source: Ambulatory Visit | Attending: General Surgery | Admitting: General Surgery

## 2015-09-19 ENCOUNTER — Encounter: Payer: Self-pay | Admitting: General Surgery

## 2015-09-19 ENCOUNTER — Telehealth: Payer: Self-pay

## 2015-09-19 ENCOUNTER — Telehealth: Payer: Self-pay | Admitting: General Surgery

## 2015-09-19 VITALS — BP 139/75 | HR 73 | Temp 97.9°F | Ht 68.0 in | Wt 342.0 lb

## 2015-09-19 DIAGNOSIS — K5720 Diverticulitis of large intestine with perforation and abscess without bleeding: Secondary | ICD-10-CM | POA: Insufficient documentation

## 2015-09-19 LAB — COMPREHENSIVE METABOLIC PANEL
ALBUMIN: 3.6 g/dL (ref 3.5–5.0)
ALK PHOS: 57 U/L (ref 38–126)
ALT: 26 U/L (ref 17–63)
AST: 20 U/L (ref 15–41)
Anion gap: 6 (ref 5–15)
BILIRUBIN TOTAL: 0.6 mg/dL (ref 0.3–1.2)
BUN: 11 mg/dL (ref 6–20)
CALCIUM: 9.2 mg/dL (ref 8.9–10.3)
CO2: 28 mmol/L (ref 22–32)
Chloride: 103 mmol/L (ref 101–111)
Creatinine, Ser: 1 mg/dL (ref 0.61–1.24)
GFR calc Af Amer: >60 mL/min (ref >60)
GFR calc non Af Amer: >60 mL/min (ref >60)
GLUCOSE: 113 mg/dL — AB (ref 65–99)
POTASSIUM: 3.7 mmol/L (ref 3.5–5.1)
Sodium: 137 mmol/L (ref 135–145)
TOTAL PROTEIN: 7.4 g/dL (ref 6.5–8.1)

## 2015-09-19 LAB — CBC WITH DIFFERENTIAL/PLATELET
BASOS ABS: 0.1 10*3/uL (ref 0–0.1)
BASOS PCT: 1 %
Eosinophils Absolute: 0.1 10*3/uL (ref 0–0.7)
Eosinophils Relative: 1 %
HEMATOCRIT: 39.2 % — AB (ref 40.0–52.0)
HEMOGLOBIN: 12.7 g/dL — AB (ref 13.0–18.0)
Lymphocytes Relative: 26 %
Lymphs Abs: 1.8 10*3/uL (ref 1.0–3.6)
MCH: 27.1 pg (ref 26.0–34.0)
MCHC: 32.5 g/dL (ref 32.0–36.0)
MCV: 83.3 fL (ref 80.0–100.0)
Monocytes Absolute: 0.9 10*3/uL (ref 0.2–1.0)
Monocytes Relative: 13 %
NEUTROS ABS: 4.1 10*3/uL (ref 1.4–6.5)
NEUTROS PCT: 59 %
Platelets: 238 10*3/uL (ref 150–440)
RBC: 4.7 MIL/uL (ref 4.40–5.90)
RDW: 14.1 % (ref 11.5–14.5)
WBC: 6.9 10*3/uL (ref 3.8–10.6)

## 2015-09-19 MED ORDER — AMOXICILLIN-POT CLAVULANATE 875-125 MG PO TABS: 1.0000 | ORAL_TABLET | Freq: Two times a day (BID) | ORAL | 0 refills | Status: DC

## 2015-09-19 MED ORDER — OXYCODONE-ACETAMINOPHEN 5-325 MG PO TABS: 1.0000 | ORAL_TABLET | ORAL | 0 refills | Status: DC | PRN

## 2015-09-19 NOTE — Progress Notes (Signed)
Outpatient Surgical Follow Up  09/19/2015  Leonard CampionJason A Pierce is an 35 y.o. male.   Chief Complaint  Patient presents with  . Follow-up    Diverticulitis of large intestine-abdominal pain x 3 days    HPI: 35 year old male with a known history diverticulitis is been on outpatient therapy for it returns to clinic stating he's had worsening abdominal pain over the last couple of days. He also states over that same time has not had a bowel movement. Patient reports that the pain has mostly been below his navel but has spread across his entire abdomen over the last 48 hours. He currently denies any fevers, chills, nausea, vomiting, chest pain, shortness breath. He has definitely having constipation with infrequent, hard stools. He states he is just not feeling as good as he was.  Past Medical History:  Diagnosis Date  . Acute MI (HCC)    Rep[facility] at 19, [facility]   . Diverticulitis 2017  . Gout     History reviewed. No pertinent surgical history. No history of abdominal surgeries  Family History  Problem Relation Age of Onset  . Non-Hodgkin's lymphoma Mother   . Diabetes Father   . Heart disease Maternal Grandfather     Social History:  reports that he has never smoked. He has never used smokeless tobacco. He reports that he does not drink alcohol or use drugs.  Allergies: No Known Allergies  Medications reviewed. Currently on Augmentin for treatment of diverticulitis    ROS A multipoint review of systems was completed, all pertinent positives and negatives were documented within the history of present illness with the remaining being negative.   BP 139/75 (BP Location: Right Arm, Patient Position: Sitting, Cuff Size: Normal)   Pulse 73   Temp 97.9 F (36.6 C) (Oral)   Ht 5\' 8"  (1.727 m)   Wt (!) 155.1 kg (342 lb)   BMI 52.00 kg/m   Physical Exam Gen.: Obese male, in no acute distress Neck: Supple and nontender Chest: Clear to auscultation with equal wall  motion Heart: Regular rate and rhythm Abdomen: Soft, tender to palpation in all 4 quadrants worsening left lower quadrant, nondistended. No evidence of rebound or guarding on exam. Extremities: Moves all extremities well without any obvious edema    No results found for this or any previous visit (from the past 48 hour(s)). No results found.  Assessment/Plan:  1. Diverticulitis of large intestine with perforation without bleeding 35 year old male with a known diverticulitis that had a contained rupture presents with worsening pain over the last 2 days. Given his history and physical findings we will obtain labs immediately should there be any evidence of elevated white blood cell count while on outpatient antibiotics it would be my recommendation that he be admitted to the hospital for further care. However, should his labs be normal then he will simply need a longer course of antibiotics and we will provide him with a supply of pain medications to get through the worst of the inflammation. Sending him for labs immediately, all questions answered to his satisfaction. He'll follow up in clinic next week if he is not admitted today. - Comprehensive metabolic panel - CBC with Differential     Ricarda Frameharles Beila Purdie, MD [facility] General Surgeon  09/19/2015,2:34 PM

## 2015-09-19 NOTE — Patient Instructions (Signed)
We will send you to the lab today and call you later today with the results. We have sent your antibiotic to the drug store. You may use Miralax  Every day until you have a bowel movement. Please call our office if you have any questions. Please see your follow up appointment listed above.

## 2015-09-19 NOTE — Telephone Encounter (Signed)
Patient of Dr Orvis BrillLoflin with a known diverticulitis that had a contained rupture. Under her care from 09/02/15 - 09/18/15. Patient returned to office today with worsening pain over the last 2 days. He  was given prescription for antibiotic and pain medication. Patient called the office after he left because he was supposed to return to work tonight - with no restrictions - and now isn't sure if he should. I called the [place] office and [facility]. She states Dr Tonita CongWoodham was gone for the day. Patient may miss work [employer] and we will discuss with Dr Tonita CongWoodham in the morning if he would like to extend patients short term disability.

## 2015-09-19 NOTE — Telephone Encounter (Signed)
Patient called because he has been having abdominal pain for the past 3 days and he hasn't had a bowel movement in the past 48 hours. I scheduled him an appointment with Dr. Tonita CongWoodham at 09/19/15 for 2:15 pm.

## 2015-09-22 ENCOUNTER — Ambulatory Visit: Payer: BLUE CROSS/BLUE SHIELD | Admitting: General Surgery

## 2015-09-23 ENCOUNTER — Telehealth: Payer: Self-pay

## 2015-09-23 NOTE — Telephone Encounter (Signed)
Patient called stating that he needed a note for work for 09/26/2015-09/27/2015 and return to work on 09/28/2015. However, he stated that if Dr. Excell Seltzerooper would want for him to be out of work longer, than he would require a separate note.  Disability Certificate will be typed up and faxed to [facility].

## 2015-09-27 ENCOUNTER — Encounter: Payer: Self-pay | Admitting: Surgery

## 2015-09-27 ENCOUNTER — Ambulatory Visit (INDEPENDENT_AMBULATORY_CARE_PROVIDER_SITE_OTHER): Payer: BLUE CROSS/BLUE SHIELD | Admitting: Surgery

## 2015-09-27 VITALS — BP 150/83 | HR 64 | Temp 98.1°F | Ht 68.0 in | Wt 343.0 lb

## 2015-09-27 DIAGNOSIS — K5720 Diverticulitis of large intestine with perforation and abscess without bleeding: Secondary | ICD-10-CM | POA: Diagnosis not present

## 2015-09-27 NOTE — Patient Instructions (Signed)
Please call our office if you have any questions or concerns.  

## 2015-09-27 NOTE — Progress Notes (Signed)
Outpatient Surgical Follow Up  09/27/2015  Leonard CampionJason A Mates is an 35 y.o. male.   CC: Acute diverticulitis  HPI: This a patient with acute diverticulitis with microperforation who spent some time in the hospital and is now doing very well he has no pain whatsoever and no fevers or chills. He has a colonoscopy scheduled with Dr. Servando SnareWohl for November. He is finishing out a course of antibiotics but is completely asymptomatic at this time  Past Medical History:  Diagnosis Date  . Acute MI (HCC)    [facility] 19[facility]   . Diverticulitis 2017  . Gout     History reviewed. No pertinent surgical history.  Family History  Problem Relation Age of Onset  . Non-Hodgkin's lymphoma Mother   . Diabetes Father   . Heart disease Maternal Grandfather     Social History:  reports that he has never smoked. He has never used smokeless tobacco. He reports that he does not drink alcohol or use drugs.  Allergies: No Known Allergies  Medications reviewed.   Review of Systems:   Review of Systems  Constitutional: Negative for chills and fever.  HENT: Negative.   Eyes: Negative.   Respiratory: Negative.   Cardiovascular: Negative.   Gastrointestinal: Negative for abdominal pain, blood in stool, constipation, diarrhea, heartburn, melena, nausea and vomiting.  Genitourinary: Negative.   Musculoskeletal: Negative.   Skin: Negative.   Neurological: Negative.   Endo/Heme/Allergies: Negative.   Psychiatric/Behavioral: Negative.      Physical Exam:  BP (!) 150/83 (BP Location: Right Arm, Patient Position: Sitting, Cuff Size: Large)   Pulse 64   Temp 98.1 F (36.7 C) (Oral)   Ht 5\' 8"  (1.727 m)   Wt (!) 343 lb (155.6 kg)   BMI 52.15 kg/m   Physical Exam  Constitutional: He is oriented to person, place, and time and well-developed, well-nourished, and in no distress. No distress.  Obese male  HENT:  Head: Normocephalic and atraumatic.  Eyes: Right eye exhibits no discharge. Left  eye exhibits no discharge. No scleral icterus.  Neck: Normal range of motion.  Pulmonary/Chest: Effort normal. No respiratory distress.  Abdominal: Soft. He exhibits no distension. There is no tenderness. There is no rebound and no guarding.  Musculoskeletal: Normal range of motion. He exhibits no edema.  Lymphadenopathy:    He has no cervical adenopathy.  Neurological: He is alert and oriented to person, place, and time.  Skin: Skin is warm and dry. No rash noted. He is not diaphoretic. No erythema.  Psychiatric: Mood and affect normal.      No results found for this or any previous visit (from the past 48 hour(s)). No results found.  Assessment/Plan:  Patient doing very well at this point he has a colonoscopy scheduled for November. He understands to call us if he has a recurrence of his left lower quadrant pain but at this point he is currently asymptomatic and finishing out his antibiotics. He knows to call should he have worsening or return of his symptoms.  Lattie Hawichard E Nasia Cannan, MD, FACS

## 2015-10-04 ENCOUNTER — Emergency Department
Admission: EM | Admit: 2015-10-04 | Discharge: 2015-10-04 | Disposition: A | Discharge status: Home / Self Care | Payer: BLUE CROSS/BLUE SHIELD | Attending: Emergency Medicine | Admitting: Emergency Medicine

## 2015-10-04 ENCOUNTER — Telehealth: Payer: Self-pay

## 2015-10-04 ENCOUNTER — Emergency Department: Payer: BLUE CROSS/BLUE SHIELD

## 2015-10-04 ENCOUNTER — Encounter: Payer: Self-pay | Admitting: Emergency Medicine

## 2015-10-04 DIAGNOSIS — K5732 Diverticulitis of large intestine without perforation or abscess without bleeding: Secondary | ICD-10-CM | POA: Diagnosis not present

## 2015-10-04 DIAGNOSIS — I252 Old myocardial infarction: Secondary | ICD-10-CM | POA: Diagnosis not present

## 2015-10-04 DIAGNOSIS — R1030 Lower abdominal pain, unspecified: Secondary | ICD-10-CM | POA: Diagnosis present

## 2015-10-04 LAB — URINALYSIS COMPLETE WITH MICROSCOPIC (ARMC ONLY)
BACTERIA UA: NONE SEEN
Bilirubin Urine: NEGATIVE
Glucose, UA: NEGATIVE mg/dL
HGB URINE DIPSTICK: NEGATIVE
Ketones, ur: NEGATIVE mg/dL
LEUKOCYTES UA: NEGATIVE
NITRITE: NEGATIVE
PH: 7 (ref 5.0–8.0)
PROTEIN: NEGATIVE mg/dL
SQUAMOUS EPITHELIAL / LPF: NONE SEEN
Specific Gravity, Urine: 1.005 (ref 1.005–1.030)
WBC UA: NONE SEEN WBC/hpf (ref 0–5)

## 2015-10-04 LAB — COMPREHENSIVE METABOLIC PANEL
ALBUMIN: 3.9 g/dL (ref 3.5–5.0)
ALK PHOS: 64 U/L (ref 38–126)
ALT: 21 U/L (ref 17–63)
ANION GAP: 6 (ref 5–15)
AST: 28 U/L (ref 15–41)
BILIRUBIN TOTAL: 0.5 mg/dL (ref 0.3–1.2)
BUN: 13 mg/dL (ref 6–20)
CHLORIDE: 104 mmol/L (ref 101–111)
CO2: 26 mmol/L (ref 22–32)
Calcium: 9.2 mg/dL (ref 8.9–10.3)
Creatinine, Ser: 0.98 mg/dL (ref 0.61–1.24)
GFR calc non Af Amer: >60 mL/min (ref >60)
Glucose, Bld: 159 mg/dL — ABNORMAL HIGH (ref 65–99)
POTASSIUM: 3.4 mmol/L — AB (ref 3.5–5.1)
SODIUM: 136 mmol/L (ref 135–145)
Total Protein: 7.5 g/dL (ref 6.5–8.1)

## 2015-10-04 LAB — CBC
HEMATOCRIT: 40 % (ref 40.0–52.0)
HEMOGLOBIN: 13.4 g/dL (ref 13.0–18.0)
MCH: 27.9 pg (ref 26.0–34.0)
MCHC: 33.6 g/dL (ref 32.0–36.0)
MCV: 82.8 fL (ref 80.0–100.0)
Platelets: 182 10*3/uL (ref 150–440)
RBC: 4.83 MIL/uL (ref 4.40–5.90)
RDW: 14.7 % — AB (ref 11.5–14.5)
WBC: 7.1 10*3/uL (ref 3.8–10.6)

## 2015-10-04 LAB — LIPASE, BLOOD: Lipase: 34 U/L (ref 11–51)

## 2015-10-04 MED ORDER — IOPAMIDOL (ISOVUE-300) INJECTION 61%
125.0000 mL | Freq: Once | INTRAVENOUS | Status: AC | PRN
  Administered 2015-10-04: 125 mL via INTRAVENOUS
  Filled 2015-10-04: qty 150

## 2015-10-04 MED ORDER — IOPAMIDOL (ISOVUE-300) INJECTION 61%
30.0000 mL | Freq: Once | INTRAVENOUS | Status: AC
Start: 2015-10-04 — End: 2015-10-04
  Administered 2015-10-04: 30 mL via ORAL
  Filled 2015-10-04: qty 30

## 2015-10-04 MED ORDER — IBUPROFEN 400 MG PO TABS: 400.0000 mg | ORAL_TABLET | Freq: Four times a day (QID) | ORAL | 0 refills | Status: DC | PRN

## 2015-10-04 MED ORDER — AMOXICILLIN-POT CLAVULANATE 875-125 MG PO TABS: 1.0000 | ORAL_TABLET | Freq: Two times a day (BID) | ORAL | 0 refills | Status: AC

## 2015-10-04 MED ORDER — SODIUM CHLORIDE 0.9 % IV BOLUS (SEPSIS)
500.0000 mL | Freq: Once | INTRAVENOUS | Status: AC
  Administered 2015-10-04: 500 mL via INTRAVENOUS

## 2015-10-04 MED ORDER — KETOROLAC TROMETHAMINE 30 MG/ML IJ SOLN
30.0000 mg | Freq: Once | INTRAMUSCULAR | Status: AC
  Administered 2015-10-04: 30 mg via INTRAVENOUS
  Filled 2015-10-04: qty 1

## 2015-10-04 NOTE — Telephone Encounter (Signed)
Patient called after being discharged from Emergency Room today stating that he was to follow-up with surgical group for plan of care.  Spoke with Dr. Tonita CongWoodham regarding this patient and he would like patient seen tomorrow by Dr. Excell Seltzerooper in clinic and decision will be made at that time as to what future plan for patient is in regards to surgical options.  Patient placed on schedule.

## 2015-10-04 NOTE — Telephone Encounter (Signed)
Patient called at this time to say he coughed up blood yesterday and today, less than a teaspoon full, and he has started hurting again in his abdomen. [facility] and was told he had called there before calling me. He was [facility]. Patient verbalized understanding.

## 2015-10-04 NOTE — Telephone Encounter (Signed)
Leonard Pierce's stomach is hurting and he threw up some blood this morning. I spoke with Amber and she told me to tell him to go to the Emergency Room. Patient understood.

## 2015-10-04 NOTE — ED Triage Notes (Signed)
Pt reports lower abdominal pain started yesterday, reports hx of diverticulitis.

## 2015-10-04 NOTE — ED Provider Notes (Signed)
Time Seen: Approximately 1410  I have reviewed the triage notes  Chief Complaint: Abdominal Pain   History of Present Illness: Leonard Pierce is a 35 y.o. male *who has a long history of diverticulitis with plans for possible colectomy and reanastomosis based on the patient's description. [facility]'s [facility]. Patient states that this pain seems similar to his previous diverticulitis pain. He has been on amoxicillin. He is not aware of any fever at home though states he's been having a cough and had a small amount of blood-tinged sputum. He is currently not on any anticoagulation therapy. UA complaints and denies any melena or hematochezia, diarrhea or constipation or bowel urgency. Patient was referred here by the surgeon's office on his description. The patient's currently taking OxyContin for pain   Past Medical History:  Diagnosis Date  . Acute MI (HCC)    Reports MI at 19, seen at [facility]   . Diverticulitis 2017  . Gout     Patient Active Problem List   Diagnosis Date Noted  . Diverticulitis large intestine 09/02/2015  . Diverticulitis of large intestine with perforation without bleeding     History reviewed. No pertinent surgical history.  History reviewed. No pertinent surgical history.  Current Outpatient Rx  . Order #: [id number] Class: Normal  . Order #: [id number] Class: Print  . Order #: [id number] Class: Historical Med  . Order #: [id number] Class: Print    Allergies:  Review of patient's allergies indicates no known allergies.  Family History: Family History  Problem Relation Age of Onset  . Non-Hodgkin's lymphoma Mother   . Diabetes Father   . Heart disease Maternal Grandfather     Social History: Social History  Substance Use Topics  . Smoking status: Never Smoker  . Smokeless tobacco: Never Used  . Alcohol use No     Review of Systems:   10 point review of systems was performed and was otherwise negative:  Constitutional: No  fever Eyes: No visual disturbances ENT: No sore throat, ear pain Cardiac: No chest pain Respiratory: No shortness of breath, wheezing, or stridor Abdomen: He describes bilateral lower abdominal pain without any descriptions of upper abdominal pain, back pain or flank pain. Endocrine: No weight loss, No night sweats Extremities: No peripheral edema, cyanosis Skin: No rashes, easy bruising Neurologic: No focal weakness, trouble with speech or swollowing Urologic: No dysuria, Hematuria, or urinary frequency   Physical Exam:  ED Triage Vitals  Enc Vitals Group     BP 10/04/15 1105 (!) 144/77     Pulse Rate 10/04/15 1105 72     Resp 10/04/15 1105 17     Temp 10/04/15 1105 98.2 F (36.8 C)     Temp Source 10/04/15 1105 Oral     SpO2 10/04/15 1105 98 %     Weight 10/04/15 1106 (!) 340 lb (154.2 kg)     Height 10/04/15 1106 5\' 8"  (1.727 m)     Head Circumference --      Peak Flow --      Pain Score 10/04/15 1110 9     Pain Loc --      Pain Edu? --      Excl. in GC? --     General: Awake , Alert , and Oriented times 3; GCS 15 Head: Normal cephalic , atraumatic Eyes: Pupils equal , round, reactive to light Nose/Throat: No nasal drainage, patent upper airway without erythema or exudate.  Neck: Supple, Full range of motion, No anterior adenopathy  or palpable thyroid masses Lungs: Clear to ascultation without wheezes , rhonchi, or rales Heart: Regular rate, regular rhythm without murmurs , gallops , or rubs Abdomen:Morbidly obese Soft, mild tenderness without rebound, guarding , or rigidity over the lower abdominal region without any peritoneal signs bowel sounds positive and symmetric in all 4 quadrants. No organomegaly .        Extremities: 2 plus symmetric pulses. No edema, clubbing or cyanosis Neurologic: normal ambulation, Motor symmetric without deficits, sensory intact Skin: warm, dry, no rashes   Labs:   All laboratory work was reviewed including any pertinent negatives or  positives listed below:  Labs Reviewed  COMPREHENSIVE METABOLIC PANEL - Abnormal; Notable for the following:       Result Value   Potassium 3.4 (*)    Glucose, Bld 159 (*)    All other components within normal limits  CBC - Abnormal; Notable for the following:    RDW 14.7 (*)    All other components within normal limits  LIPASE, BLOOD  URINALYSIS COMPLETEWITH MICROSCOPIC ([facility] ONLY)  Laboratory work was reviewed and showed no clinically significant abnormalities.   Radiology: *  "Dg Chest 2 View  Result Date: 10/04/2015 CLINICAL DATA:  Lower abdominal pain, lumps since yesterday. EXAM: CHEST  2 VIEW COMPARISON:  03/07/2015 FINDINGS: Heart and mediastinal contours are within normal limits. No focal opacities or effusions. No acute bony abnormality. IMPRESSION: No active cardiopulmonary disease. Electronically Signed   By: Charlett Nose M.D.   On: 10/04/2015 14:36   Ct Abdomen Pelvis W Contrast  Result Date: 10/04/2015 CLINICAL DATA:  Left lower quadrant pain, 2 days duration. Previous history of diverticulitis. EXAM: CT ABDOMEN AND PELVIS WITH CONTRAST TECHNIQUE: Multidetector CT imaging of the abdomen and pelvis was performed using the standard protocol following bolus administration of intravenous contrast. CONTRAST:  ISOVUE-300 IOPAMIDOL (ISOVUE-300) INJECTION 61% COMPARISON:  09/02/2015 FINDINGS: Lower chest:  Lungs clear.  No pleural or pericardial fluid. Hepatobiliary: No liver lesion.  No calcified gallstones. Pancreas: Normal Spleen: Normal Adrenals/Urinary Tract: Adrenal glands are normal. Kidneys are normal. Stomach/Bowel: The pattern is improved compared 1 month ago. There is only mild residual edema at the descending sigmoid junction region consistent with mild residual change of acute diverticulitis. No worsening or new finding. No free fluid or air. No bowel obstruction. No new area of diverticulitis identified. Some haziness of the root of the mesenteries is unchanged. Normal  appearing appendix. Vascular/Lymphatic: Normal Reproductive: Normal Other: None significant Musculoskeletal:  Normal IMPRESSION: Radiographic improvement with resolving findings of acute diverticulitis at the descending sigmoid junction. Compared to last month, there is only mild residual pericolic edema and there is no evidence of abscess or other complication. Electronically Signed   By: Paulina Fusi M.D.   On: 10/04/2015 16:05  "  I personally reviewed the radiologic studies    ED Course:  Patient's stay here was uneventful and he has a general surgeon involved in his care. He was advised to contact them for further outpatient management. I did not want to add another narcotic to his therapy at this point. Options were Tylenol and/or ibuprofen. The patient states he only has one more day on the antibiotic and it seems that his diverticulitis is improving though still present. Patient will be prescribed a continual course of Augmentin. He's been advised to return here if he develops any further hemoptysis as the source at this time is unknown. I felt was unlikely to be a pulmonary embolism given his  lack of chest pain or shortness of breath or any other concerns. Possible bronchitis Clinical Course     Assessment: Acute exacerbation of persistent diverticulitis Hemoptysis unknown etiology     Plan: Outpatient " New Prescriptions   AMOXICILLIN-CLAVULANATE (AUGMENTIN) 875-125 MG TABLET    Take 1 tablet by mouth 2 (two) times daily.   IBUPROFEN (ADVIL,MOTRIN) 400 MG TABLET    Take 1 tablet (400 mg total) by mouth every 6 (six) hours as needed.  " Patient was advised to return immediately if condition worsens. Patient was advised to follow up with their primary care physician or other specialized physicians involved in their outpatient care. The patient and/or family member/power of attorney had laboratory results reviewed at the bedside. All questions and concerns were addressed and  appropriate discharge instructions were distributed by the nursing staff.            Jennye MoccasinBrian S Kimmora Risenhoover, MD 10/04/15 [id number]

## 2015-10-04 NOTE — Discharge Instructions (Signed)
Please return immediately if condition worsens. Please contact her primary physician or the physician you were given for referral. If you have any specialist physicians involved in her treatment and plan please also contact them. Thank you for using [facility]. Please discuss with your surgeon for further outpatient management

## 2015-10-05 ENCOUNTER — Encounter: Payer: Self-pay | Admitting: Surgery

## 2015-10-05 ENCOUNTER — Ambulatory Visit (INDEPENDENT_AMBULATORY_CARE_PROVIDER_SITE_OTHER): Payer: BLUE CROSS/BLUE SHIELD | Admitting: Surgery

## 2015-10-05 VITALS — BP 160/97 | HR 76 | Temp 98.6°F | Wt 349.0 lb

## 2015-10-05 DIAGNOSIS — K5720 Diverticulitis of large intestine with perforation and abscess without bleeding: Secondary | ICD-10-CM

## 2015-10-05 NOTE — Progress Notes (Signed)
Outpatient Surgical Follow Up  10/05/2015  Leonard Pierce is an 35 y.o. male.   CC: Diverticulitis  HPI: This patient was diagnosed with acute diverticulitis in the past that he's been on antibiotics off-and-on he was in the emergency room yesterday where his Augmentin was refilled. Taking ibuprofen for pain. He complains of ongoing left lower quadrant pain. He did have some hemoptysis yesterday and he's never had that before and had a negative workup in the emergency room.  Past Medical History:  Diagnosis Date  . Acute MI ([place])    Reports MI at 71, seen at [facility]   . Diverticulitis 2017  . Gout     No past surgical history on file.  Family History  Problem Relation Age of Onset  . Non-Hodgkin's lymphoma Mother   . Diabetes Father   . Heart disease Maternal Grandfather     Social History:  reports that he has never smoked. He has never used smokeless tobacco. He reports that he does not drink alcohol or use drugs.  Allergies: No Known Allergies  Medications reviewed.   Review of Systems:   Review of Systems  Constitutional: Negative for chills, fever and weight loss.  HENT: Negative.   Eyes: Negative.   Respiratory: Positive for cough and hemoptysis. Negative for sputum production, shortness of breath and wheezing.   Cardiovascular: Negative for chest pain, palpitations, orthopnea and claudication.  Gastrointestinal: Positive for abdominal pain and constipation. Negative for blood in stool, diarrhea, heartburn, melena, nausea and vomiting.  Genitourinary: Negative.   Musculoskeletal: Negative.   Skin: Negative.   Neurological: Negative.   Endo/Heme/Allergies: Negative.   Psychiatric/Behavioral: Negative.      Physical Exam:  There were no vitals taken for this visit.  Physical Exam  Constitutional: He is oriented to person, place, and time and well-developed, well-nourished, and in no distress. No distress.  Morbidly obese male patient in no acute distress  comfortable-appearing  HENT:  Head: Normocephalic and atraumatic.  Eyes: Right eye exhibits no discharge. Left eye exhibits no discharge. No scleral icterus.  Neck: Normal range of motion.  Cardiovascular: Normal rate, regular rhythm and normal heart sounds.   Pulmonary/Chest: Effort normal and breath sounds normal. No respiratory distress. He has no wheezes. He has no rales.  Abdominal: Soft. He exhibits no distension. There is tenderness. There is no rebound and no guarding.  Minimal left lower quadrant tenderness without rebound or percussion tenderness  Musculoskeletal: Normal range of motion. He exhibits no edema or tenderness.  Lymphadenopathy:    He has no cervical adenopathy.  Neurological: He is alert and oriented to person, place, and time.  Skin: Skin is warm and dry. No rash noted. He is not diaphoretic. No erythema.  Psychiatric: Mood and affect normal.  Vitals reviewed.     Results for orders placed or performed during the hospital encounter of 10/04/15 (from the past 48 hour(s))  Lipase, blood     Status: None   Collection Time: 10/04/15 11:11 AM  Result Value Ref Range   Lipase 34 11 - 51 U/L  Comprehensive metabolic panel     Status: Abnormal   Collection Time: 10/04/15 11:11 AM  Result Value Ref Range   Sodium 136 135 - 145 mmol/L   Potassium 3.4 (L) 3.5 - 5.1 mmol/L   Chloride 104 101 - 111 mmol/L   CO2 26 22 - 32 mmol/L   Glucose, Bld 159 (H) 65 - 99 mg/dL   BUN 13 6 - 20 mg/dL  Creatinine, Ser 0.98 0.61 - 1.24 mg/dL   Calcium 9.2 8.9 - 10.3 mg/dL   Total Protein 7.5 6.5 - 8.1 g/dL   Albumin 3.9 3.5 - 5.0 g/dL   AST 28 15 - 41 U/L   ALT 21 17 - 63 U/L   Alkaline Phosphatase 64 38 - 126 U/L   Total Bilirubin 0.5 0.3 - 1.2 mg/dL   GFR calc non Af Amer >60 >60 mL/min   GFR calc Af Amer >60 >60 mL/min    Comment: (NOTE) The eGFR has been calculated using the CKD EPI equation. This calculation has not been validated in all clinical situations. eGFR's  persistently <60 mL/min signify possible Chronic Kidney Disease.    Anion gap 6 5 - 15  CBC     Status: Abnormal   Collection Time: 10/04/15 11:11 AM  Result Value Ref Range   WBC 7.1 3.8 - 10.6 K/uL   RBC 4.83 4.40 - 5.90 MIL/uL   Hemoglobin 13.4 13.0 - 18.0 g/dL   HCT 40.0 40.0 - 52.0 %   MCV 82.8 80.0 - 100.0 fL   MCH 27.9 26.0 - 34.0 pg   MCHC 33.6 32.0 - 36.0 g/dL   RDW 14.7 (H) 11.5 - 14.5 %   Platelets 182 150 - 440 K/uL  Urinalysis complete, with microscopic     Status: Abnormal   Collection Time: 10/04/15  2:20 PM  Result Value Ref Range   Color, Urine COLORLESS (A) YELLOW   APPearance CLEAR (A) CLEAR   Glucose, UA NEGATIVE NEGATIVE mg/dL   Bilirubin Urine NEGATIVE NEGATIVE   Ketones, ur NEGATIVE NEGATIVE mg/dL   Specific Gravity, Urine 1.005 1.005 - 1.030   Hgb urine dipstick NEGATIVE NEGATIVE   pH 7.0 5.0 - 8.0   Protein, ur NEGATIVE NEGATIVE mg/dL   Nitrite NEGATIVE NEGATIVE   Leukocytes, UA NEGATIVE NEGATIVE   RBC / HPF 0-5 0 - 5 RBC/hpf   WBC, UA NONE SEEN 0 - 5 WBC/hpf   Bacteria, UA NONE SEEN NONE SEEN   Squamous Epithelial / LPF NONE SEEN NONE SEEN   Dg Chest 2 View  Result Date: 10/04/2015 CLINICAL DATA:  Lower abdominal pain, lumps since yesterday. EXAM: CHEST  2 VIEW COMPARISON:  03/07/2015 FINDINGS: Heart and mediastinal contours are within normal limits. No focal opacities or effusions. No acute bony abnormality. IMPRESSION: No active cardiopulmonary disease. Electronically Signed   By: Rolm Baptise M.D.   On: 10/04/2015 14:36   Ct Abdomen Pelvis W Contrast  Result Date: 10/04/2015 CLINICAL DATA:  Left lower quadrant pain, 2 days duration. Previous history of diverticulitis. EXAM: CT ABDOMEN AND PELVIS WITH CONTRAST TECHNIQUE: Multidetector CT imaging of the abdomen and pelvis was performed using the standard protocol following bolus administration of intravenous contrast. CONTRAST:  176m ISOVUE-300 IOPAMIDOL (ISOVUE-300) INJECTION 61% COMPARISON:   09/02/2015 FINDINGS: Lower chest:  Lungs clear.  No pleural or pericardial fluid. Hepatobiliary: No liver lesion.  No calcified gallstones. Pancreas: Normal Spleen: Normal Adrenals/Urinary Tract: Adrenal glands are normal. Kidneys are normal. Stomach/Bowel: The pattern is improved compared 1 month ago. There is only mild residual edema at the descending sigmoid junction region consistent with mild residual change of acute diverticulitis. No worsening or new finding. No free fluid or air. No bowel obstruction. No new area of diverticulitis identified. Some haziness of the root of the mesenteries is unchanged. Normal appearing appendix. Vascular/Lymphatic: Normal Reproductive: Normal Other: None significant Musculoskeletal:  Normal IMPRESSION: Radiographic improvement with resolving findings of acute diverticulitis  at the descending sigmoid junction. Compared to last month, there is only mild residual pericolic edema and there is no evidence of abscess or other complication. Electronically Signed   By: Nelson Chimes M.D.   On: 10/04/2015 16:05    Assessment/Plan:  Acute diverticulitis with improving CT scan currently on Augmentin. He was in the emergency room yesterday because he was still having left lower quadrant pain but was also experiencing some hemoptysis. I would recommend continuing the Augmentin at this point and following up with Dr. Adonis Huguenin next week.  Hemoptysis. This happened on 2 occasions yesterday and never before or since. A chest x-ray was normal and the emergency room physician did not feel that he needed any further workup. Currently he is no acute distress and not visibly coughing and his ref sounds are normal. He's been diagnosed with bronchitis and kept on the Augmentin. The patient shows no sign of this being a pulmonary embolus. He isn't currently in no acute distress is not short of breath and having no chest pain. Of interest however is that he was diagnosed with bronchitis but had  not been coughing previously and has not coughed sets. The etiology of this hemoptysis is very unclear to me but will be observed at this point.  Florene Glen, MD, FACS

## 2015-10-06 ENCOUNTER — Telehealth: Payer: Self-pay

## 2015-10-06 NOTE — Telephone Encounter (Signed)
Disability Form was filled out and faxed to [employer]. 

## 2015-10-13 ENCOUNTER — Encounter: Payer: Self-pay | Admitting: General Surgery

## 2015-10-13 ENCOUNTER — Ambulatory Visit (INDEPENDENT_AMBULATORY_CARE_PROVIDER_SITE_OTHER): Payer: BLUE CROSS/BLUE SHIELD | Admitting: General Surgery

## 2015-10-13 VITALS — BP 158/93 | HR 69 | Temp 98.2°F | Ht 68.0 in | Wt 351.0 lb

## 2015-10-13 DIAGNOSIS — K5720 Diverticulitis of large intestine with perforation and abscess without bleeding: Secondary | ICD-10-CM

## 2015-10-13 NOTE — Progress Notes (Signed)
Outpatient Surgical Follow Up  10/13/2015  Leonard Pierce is an 35 y.o. male.   Chief Complaint  Patient presents with  . Follow-up    Diverticulitis    HPI: 35 year old male returns to clinic for follow-up from recent diverticulitis. Patient reports his abdominal pain has resolved. He has completed his outpatient course of antibiotics. His only complaint today is of some bright red blood per rectum with bowel movements. He has only seen on the toilet paper. He denies any abdominal pain, fevers, chills, nausea, vomiting, chest pain, shortness breath, diarrhea. He has been dealing with constipation. No other active complaints today. He is yet to have a colonoscopy.  Past Medical History:  Diagnosis Date  . Acute MI (HCC)    [facility] at 19[facility]   . Diverticulitis 2017  . Gout     Past Surgical History:  Procedure Laterality Date  . NO PAST SURGERIES     per patient 10/13/15    Family History  Problem Relation Age of Onset  . Non-Hodgkin's lymphoma Mother   . Diabetes Father   . Heart disease Maternal Grandfather     Social History:  reports that he has never smoked. He has never used smokeless tobacco. He reports that he does not drink alcohol or use drugs.  Allergies: No Known Allergies  Medications reviewed.    ROS A multipoint review of systems was completed, all pertinent positives and negatives are documented in the history of present illness the remainder negative.   BP (!) 158/93   Pulse 69   Temp 98.2 F (36.8 C) (Oral)   Ht 5\' 8"  (1.727 m)   Wt (!) 159.2 kg (351 lb)   BMI 53.37 kg/m   Physical Exam Gen.: No acute distress Chest: Clear to auscultation Heart: Regular rhythm Abdomen: Very large, soft, nontender    No results found for this or any previous visit (from the past 48 hour(s)). No results found.  Assessment/Plan:  1. Diverticulitis of large intestine with perforation without bleeding 35 year old male with recent  diverticulitis. He has completed outpatient antibiotic therapy. Discussed the importance of obtaining an interval colonoscopy and the reasons for. He voiced understanding. We'll set him up for colonoscopy in approximate 4 weeks. He'll follow-up in clinic 1-2 weeks after that.     Ricarda Frameharles Theda Payer, MD [facility] General Surgeon  10/13/2015,10:57 AM

## 2015-10-13 NOTE — Patient Instructions (Addendum)
You should increase water intake and activity level as much as possible to help with bowel movement. Also you should use Miralax over the counter 17 grams (1 capful) daily. You may take this up to 3 times daily to help with bowel movements. If you are taking this up to 3 times daily and you are still having difficulty having bowel movements, please call our office fur further instructions.  Your Colonoscopy has been rescheduled for 12/06/15 with Dr. Servando Snare at [facility]. Please see information provided for this.  You will follow-up with Dr. Tonita Cong after your Colonoscopy. I will call you with this appointment as soon as I can schedule this in November.    Diverticulitis Diverticulitis is inflammation or infection of small pouches in your colon that form when you have a condition called diverticulosis. The pouches in your colon are called diverticula. Your colon, or large intestine, is where water is absorbed and stool is formed. Complications of diverticulitis can include:  Bleeding.  Severe infection.  Severe pain.  Perforation of your colon.  Obstruction of your colon. CAUSES  Diverticulitis is caused by bacteria. Diverticulitis happens when stool becomes trapped in diverticula. This allows bacteria to grow in the diverticula, which can lead to inflammation and infection. RISK FACTORS People with diverticulosis are at risk for diverticulitis. Eating a diet that does not include enough fiber from fruits and vegetables may make diverticulitis more likely to develop. SYMPTOMS  Symptoms of diverticulitis may include:  Abdominal pain and tenderness. The pain is normally located on the left side of the abdomen, but may occur in other areas.  Fever and chills.  Bloating.  Cramping.  Nausea.  Vomiting.  Constipation.  Diarrhea.  Blood in your stool. DIAGNOSIS  Your health care provider will ask you about your medical history and do a physical exam. You may need to have tests done  because many medical conditions can cause the same symptoms as diverticulitis. Tests may include:  Blood tests.  Urine tests.  Imaging tests of the abdomen, including X-rays and CT scans. When your condition is under control, your health care provider may recommend that you have a colonoscopy. A colonoscopy can show how severe your diverticula are and whether something else is causing your symptoms. TREATMENT  Most cases of diverticulitis are mild and can be treated at home. Treatment may include:  Taking over-the-counter pain medicines.  Following a clear liquid diet.  Taking antibiotic medicines by mouth for 7-10 days. More severe cases may be treated at a hospital. Treatment may include:  Not eating or drinking.  Taking prescription pain medicine.  Receiving antibiotic medicines through an IV tube.  Receiving fluids and nutrition through an IV tube.  Surgery. HOME CARE INSTRUCTIONS   Follow your health care provider's instructions carefully.  Follow a full liquid diet or other diet as directed by your health care provider. After your symptoms improve, your health care provider may tell you to change your diet. He or she may recommend you eat a high-fiber diet. Fruits and vegetables are good sources of fiber. Fiber makes it easier to pass stool.  Take fiber supplements or probiotics as directed by your health care provider.  Only take medicines as directed by your health care provider.  Keep all your follow-up appointments. SEEK MEDICAL CARE IF:   Your pain does not improve.  You have a hard time eating food.  Your bowel movements do not return to normal. SEEK IMMEDIATE MEDICAL CARE IF:   Your pain becomes  worse.  Your symptoms do not get better.  Your symptoms suddenly get worse.  You have a fever.  You have repeated vomiting.  You have bloody or black, tarry stools. MAKE SURE YOU:   Understand these instructions.  Will watch your condition.  Will  get help right away if you are not doing well or get worse.   This information is not intended to replace advice given to you by your health care provider. Make sure you discuss any questions you have with your health care provider.   Document Released: 10/25/2004 Document Revised: 01/20/2013 Document Reviewed: 12/10/2012 Elsevier Interactive Patient Education [employer]2016 Elsevier Inc.   Diverticulosis Diverticulosis is the condition that develops when small pouches (diverticula) form in the wall of your colon. Your colon, or large intestine, is where water is absorbed and stool is formed. The pouches form when the inside layer of your colon pushes through weak spots in the outer layers of your colon. CAUSES  No one knows exactly what causes diverticulosis. RISK FACTORS  Being older than 50. Your risk for this condition increases with age. Diverticulosis is rare in people younger than 40 years. By age 37, almost everyone has it.  Eating a low-fiber diet.  Being frequently constipated.  Being overweight.  Not getting enough exercise.  Smoking.  Taking over-the-counter pain medicines, like aspirin and ibuprofen. SYMPTOMS  Most people with diverticulosis do not have symptoms. DIAGNOSIS  Because diverticulosis often has no symptoms, health care providers often discover the condition during an exam for other colon problems. In many cases, a health care provider will diagnose diverticulosis while using a flexible scope to examine the colon (colonoscopy). TREATMENT  If you have never developed an infection related to diverticulosis, you may not need treatment. If you have had an infection before, treatment may include:  Eating more fruits, vegetables, and grains.  Taking a fiber supplement.  Taking a live bacteria supplement (probiotic).  Taking medicine to relax your colon. HOME CARE INSTRUCTIONS   Drink at least 6-8 glasses of water each day to prevent constipation.  Try not to  strain when you have a bowel movement.  Keep all follow-up appointments. If you have had an infection before:  Increase the fiber in your diet as directed by your health care provider or dietitian.  Take a dietary fiber supplement if your health care provider approves.  Only take medicines as directed by your health care provider. SEEK MEDICAL CARE IF:   You have abdominal pain.  You have bloating.  You have cramps.  You have not gone to the bathroom in 3 days. SEEK IMMEDIATE MEDICAL CARE IF:   Your pain gets worse.  Yourbloating becomes very bad.  You have a fever or chills, and your symptoms suddenly get worse.  You begin vomiting.  You have bowel movements that are bloody or black. MAKE SURE YOU:  Understand these instructions.  Will watch your condition.  Will get help right away if you are not doing well or get worse.   This information is not intended to replace advice given to you by your health care provider. Make sure you discuss any questions you have with your health care provider.   Document Released: 10/13/2003 Document Revised: 01/20/2013 Document Reviewed: 12/10/2012 Elsevier Interactive Patient Education [employer]2016 Elsevier Inc.

## 2015-10-17 ENCOUNTER — Telehealth: Payer: Self-pay | Admitting: Surgery

## 2015-10-17 ENCOUNTER — Telehealth: Payer: Self-pay

## 2015-10-17 NOTE — Telephone Encounter (Signed)
Patient had called twice in requesting an additional day to be off from work. Patient was told in the AM that Dr. Tonita CongWoodham would not extend his disability.  When patient was here last on 10/13/2015, Dr. Tonita CongWoodham specifically stated that he would not extend his days off from work. Therefore, patient needs to go to work as of today.

## 2015-10-17 NOTE — Telephone Encounter (Signed)
[employer] disability form was filled out and faxed.

## 2015-10-17 NOTE — Telephone Encounter (Signed)
Patient needs a note to return to work for [employer]. He needs it for tomorrow with no restrictions. Fax to [phone] Please call if you have any questions.

## 2015-11-24 ENCOUNTER — Encounter: Payer: Self-pay | Admitting: Emergency Medicine

## 2015-11-24 ENCOUNTER — Emergency Department
Admission: EM | Admit: 2015-11-24 | Discharge: 2015-11-24 | Disposition: A | Discharge status: Home / Self Care | Payer: BLUE CROSS/BLUE SHIELD | Attending: Emergency Medicine | Admitting: Emergency Medicine

## 2015-11-24 DIAGNOSIS — I2510 Atherosclerotic heart disease of native coronary artery without angina pectoris: Secondary | ICD-10-CM | POA: Diagnosis not present

## 2015-11-24 DIAGNOSIS — K5732 Diverticulitis of large intestine without perforation or abscess without bleeding: Secondary | ICD-10-CM | POA: Diagnosis not present

## 2015-11-24 DIAGNOSIS — Z79899 Other long term (current) drug therapy: Secondary | ICD-10-CM | POA: Diagnosis not present

## 2015-11-24 DIAGNOSIS — R1032 Left lower quadrant pain: Secondary | ICD-10-CM

## 2015-11-24 HISTORY — DX: Atherosclerotic heart disease of native coronary artery without angina pectoris: I25.10

## 2015-11-24 LAB — COMPREHENSIVE METABOLIC PANEL
ALT: 22 U/L (ref 17–63)
ANION GAP: 8 (ref 5–15)
AST: 27 U/L (ref 15–41)
Albumin: 3.9 g/dL (ref 3.5–5.0)
Alkaline Phosphatase: 54 U/L (ref 38–126)
BUN: 15 mg/dL (ref 6–20)
CHLORIDE: 103 mmol/L (ref 101–111)
CO2: 26 mmol/L (ref 22–32)
CREATININE: 1.2 mg/dL (ref 0.61–1.24)
Calcium: 9.3 mg/dL (ref 8.9–10.3)
Glucose, Bld: 101 mg/dL — ABNORMAL HIGH (ref 65–99)
POTASSIUM: 4.1 mmol/L (ref 3.5–5.1)
SODIUM: 137 mmol/L (ref 135–145)
Total Bilirubin: 0.3 mg/dL (ref 0.3–1.2)
Total Protein: 7.8 g/dL (ref 6.5–8.1)

## 2015-11-24 LAB — CBC
HEMATOCRIT: 42.2 % (ref 40.0–52.0)
HEMOGLOBIN: 14.1 g/dL (ref 13.0–18.0)
MCH: 27.7 pg (ref 26.0–34.0)
MCHC: 33.5 g/dL (ref 32.0–36.0)
MCV: 82.7 fL (ref 80.0–100.0)
PLATELETS: 206 10*3/uL (ref 150–440)
RBC: 5.1 MIL/uL (ref 4.40–5.90)
RDW: 15.4 % — ABNORMAL HIGH (ref 11.5–14.5)
WBC: 6.7 10*3/uL (ref 3.8–10.6)

## 2015-11-24 LAB — LIPASE, BLOOD: LIPASE: 36 U/L (ref 11–51)

## 2015-11-24 MED ORDER — METRONIDAZOLE 500 MG PO TABS: 500.0000 mg | ORAL_TABLET | Freq: Three times a day (TID) | ORAL | 0 refills | Status: DC

## 2015-11-24 MED ORDER — ONDANSETRON 4 MG PO TBDP: 4.0000 mg | ORAL_TABLET | Freq: Three times a day (TID) | ORAL | 0 refills | Status: DC | PRN

## 2015-11-24 MED ORDER — CIPROFLOXACIN HCL 500 MG PO TABS: 500.0000 mg | ORAL_TABLET | Freq: Two times a day (BID) | ORAL | 0 refills | Status: DC

## 2015-11-24 NOTE — ED Provider Notes (Signed)
[facility] Emergency Department Provider Note  ____________________________________________  Time seen: Approximately 8:46 AM  I have reviewed the triage vital signs and the nursing notes.   HISTORY  Chief Complaint Abdominal Pain    HPI Leonard Pierce is a 35 y.o. male with a history of diverticulitis who complains of left lower quadrant abdominal pain with nausea that started yesterday. He is scheduled to have a colonoscopy with GI in about 2 weeks, after which she plans to follow up with surgery with Dr. Excell Seltzer and Dr. Tonita Cong for a colectomy due to his diverticulitis.He most recently finished a course of antibiotics for diverticulitis at the beginning of this month, and the symptoms feel the same. Left lower quadrant abdominal pain, nonradiating, no aggravating or alleviating factors. Constant waxing and waning in moderate in intensity. No change in bowel habits, no rectal bleeding. No fevers chills or vomiting.     Past Medical History:  Diagnosis Date  . Acute MI    Reports MI at 19, seen at [facility]   . Coronary artery disease   . Diverticulitis 2017  . Gout      Patient Active Problem List   Diagnosis Date Noted  . Diverticulitis large intestine 09/02/2015  . Diverticulitis of large intestine with perforation without bleeding      Past Surgical History:  Procedure Laterality Date  . NO PAST SURGERIES     per patient 10/13/15     Prior to Admission medications   Medication Sig Start Date End Date Taking? Authorizing Provider  colchicine 0.6 MG tablet Take 0.6 mg by mouth daily.  09/06/15  Yes Historical Provider, MD  ciprofloxacin (CIPRO) 500 MG tablet Take 1 tablet (500 mg total) by mouth 2 (two) times daily. 11/24/15   Sharman Cheek, MD  metroNIDAZOLE (FLAGYL) 500 MG tablet Take 1 tablet (500 mg total) by mouth 3 (three) times daily. 11/24/15   Sharman Cheek, MD  ondansetron (ZOFRAN ODT) 4 MG disintegrating tablet Take 1 tablet (4 mg  total) by mouth every 8 (eight) hours as needed for nausea or vomiting. 11/24/15   Sharman Cheek, MD     Allergies Review of patient's allergies indicates no known allergies.   Family History  Problem Relation Age of Onset  . Non-Hodgkin's lymphoma Mother   . Diabetes Father   . Heart disease Maternal Grandfather     Social History Social History  Substance Use Topics  . Smoking status: Never Smoker  . Smokeless tobacco: Never Used  . Alcohol use No    Review of Systems  Constitutional:   No fever or chills.   Cardiovascular:   No chest pain. Respiratory:   No dyspnea or cough. Gastrointestinal:   Positive abdominal pain as above without vomiting or diarrhea.  Genitourinary:   Negative for dysuria or difficulty urinating.  10-point ROS otherwise negative.  ____________________________________________   PHYSICAL EXAM:  VITAL SIGNS: ED Triage Vitals  Enc Vitals Group     BP 11/24/15 0834 (!) 142/74     Pulse Rate 11/24/15 0834 63     Resp 11/24/15 0834 18     Temp 11/24/15 0834 97.5 F (36.4 C)     Temp Source 11/24/15 0834 Oral     SpO2 11/24/15 0834 98 %     Weight 11/24/15 0834 300 lb (136.1 kg)     Height 11/24/15 0834 5\' 10"  (1.778 m)     Head Circumference --      Peak Flow --  Pain Score 11/24/15 0835 10     Pain Loc --      Pain Edu? --      Excl. in GC? --     Vital signs reviewed, nursing assessments reviewed.   Constitutional:   Alert and oriented. Well appearing and in no distress. Eyes:   No scleral icterus. No conjunctival pallor. PERRL. EOMI.  No nystagmus. ENT   Head:   Normocephalic and atraumatic.   Nose:   No congestion/rhinnorhea. No septal hematoma   Mouth/Throat:   MMM, no pharyngeal erythema. No peritonsillar mass.    Neck:   No stridor. No SubQ emphysema. No meningismus. Hematological/Lymphatic/Immunilogical:   No cervical lymphadenopathy. Cardiovascular:   RRR. Symmetric bilateral radial and DP pulses.  No  murmurs.  Respiratory:   Normal respiratory effort without tachypnea nor retractions. Breath sounds are clear and equal bilaterally. No wheezes/rales/rhonchi. Gastrointestinal:   Soft With left lower quadrant abdominal tenderness. Non distended. There is no CVA tenderness.  No rebound, rigidity, or guarding. Genitourinary:   deferred Musculoskeletal:   Nontender with normal range of motion in all extremities. No joint effusions.  No lower extremity tenderness.  No edema. Neurologic:   Normal speech and language.  CN 2-10 normal. Motor grossly intact. No gross focal neurologic deficits are appreciated.  Skin:    Skin is warm, dry and intact. No rash noted.  No petechiae, purpura, or bullae.  ____________________________________________    LABS (pertinent positives/negatives) (all labs ordered are listed, but only abnormal results are displayed) Labs Reviewed  COMPREHENSIVE METABOLIC PANEL - Abnormal; Notable for the following:       Result Value   Glucose, Bld 101 (*)    All other components within normal limits  CBC - Abnormal; Notable for the following:    RDW 15.4 (*)    All other components within normal limits  LIPASE, BLOOD  URINALYSIS COMPLETEWITH MICROSCOPIC ([facility] ONLY)   ____________________________________________   EKG    ____________________________________________    RADIOLOGY    ____________________________________________   PROCEDURES Procedures  ____________________________________________   INITIAL IMPRESSION / ASSESSMENT AND PLAN / ED COURSE  Pertinent labs & imaging results that were available during my care of the patient were reviewed by me and considered in my medical decision making (see chart for details).  Patient well appearing no acute distress, vital signs unremarkable. Reports with her current symptoms consistent with establish diagnosis of diverticulitis. Abdomen is nonsurgical, no peritonitis. No fever. We'll check labs. If no  severe findings, hitting the patient does not warrant repeat imaging today and can be restarted on a course of antibiotics pending his follow up for colonoscopy and surgical evaluation. Low suspicion for hernia bowel obstruction or perforation.     Clinical Course   _____________  ----------------------------------------- 9:11 AM on 11/24/2015 -----------------------------------------  Labs unremarkable. Pain very manageable, patient denied antiemetics or analgesics in the ED. Discharge Cipro Flagyl and Zofran. _______________________________   FINAL CLINICAL IMPRESSION(S) / ED DIAGNOSES  Final diagnoses:  Left lower quadrant pain  Diverticulitis of large intestine without perforation or abscess without bleeding       Portions of this note were generated with dragon dictation software. Dictation errors may occur despite best attempts at proofreading.    Sharman CheekPhillip Juandavid Dallman, MD 11/24/15 [phone]

## 2015-11-24 NOTE — ED Notes (Signed)
EDP at bedside  

## 2015-11-24 NOTE — ED Triage Notes (Signed)
Ems pt from work with LLQ abd pain , hx of diverticulitis , on recent ABX for the same, a scheduled colonoscopy in the next few weeks . Pt complaining of LLQ abd pain with nausea x1 day

## 2015-11-28 ENCOUNTER — Telehealth: Payer: Self-pay | Admitting: General Surgery

## 2015-11-28 NOTE — Telephone Encounter (Signed)
Spoke with Dr. Tonita CongWoodham in regards to this patient and ER Visit/labs were reviewed.  We will NOT be giving patient a note to be out of work.  He will continue with Colonoscopy as planned and follow-up with Dr. Aleen CampiPiscoya on 12/15/15.

## 2015-11-28 NOTE — Telephone Encounter (Signed)
Patient has called and is requesting to be seen and would like a note to go back to work on 11/30/15 (this Wednesday). He was seen in the ED on 11/24/15 for left lower quadrant pain. Patient has a hx of diverticulitis. All labs normal and was given antibiotics for treatment-- CIPRO, FLAGYL and ZOFRAN. Patient is still on antibiotics at this time. He rates his pain at an 8.   He has a colonoscopy scheduled with Dr Servando SnareWohl on 12/06/15 and is to follow back up with Dr Tonita CongWoodham to discuss other treatment options.   Please call at phone number verified in chart. [phone].

## 2015-11-30 IMAGING — CR DG FOOT COMPLETE 3+V*R*
3 series · 3 of 3 positions shown · non-contrast
Comparison: None available for comparison at time of study
interpretation.

CLINICAL DATA: Right foot pain, history of gout, no injury.

EXAM:
RIGHT FOOT COMPLETE - 3+ VIEW

[x foot ap right]
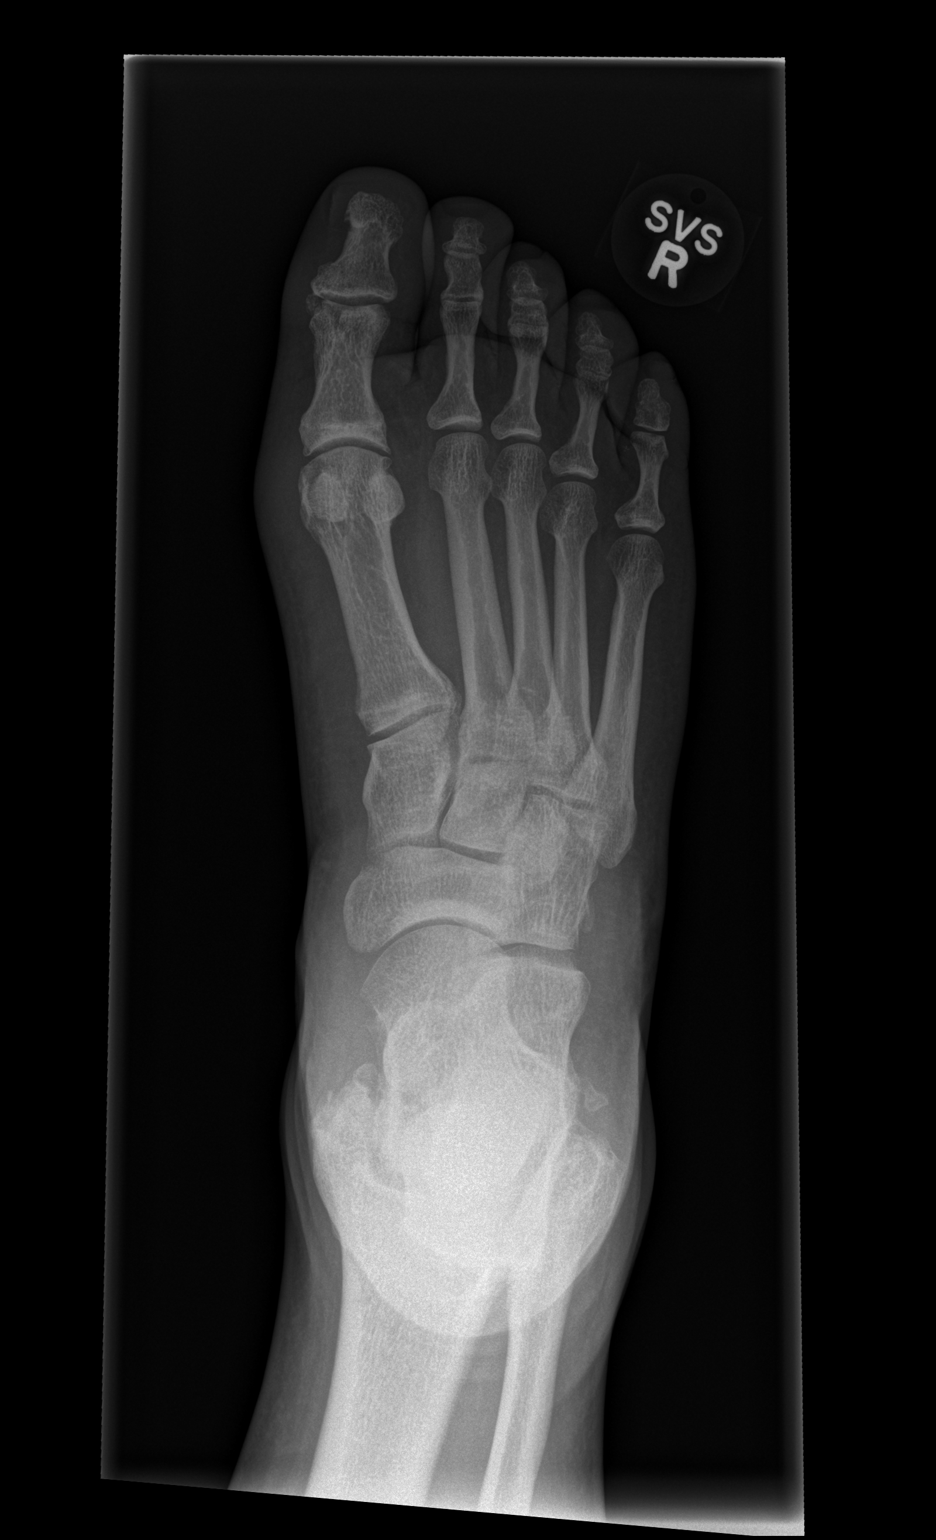

[x foot obl right]
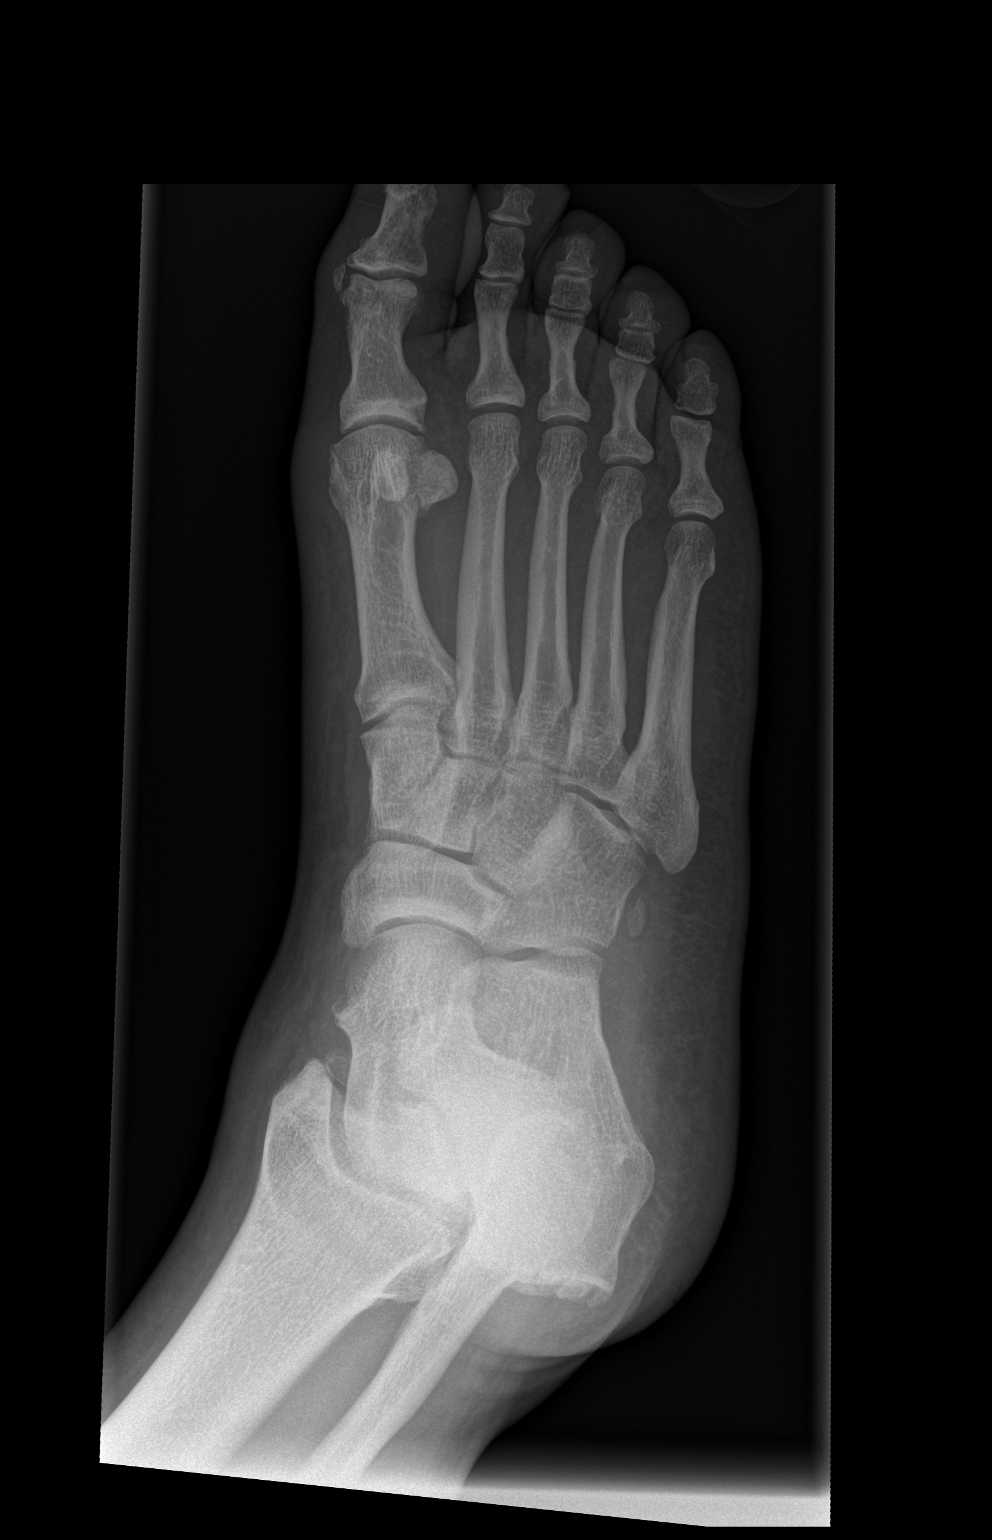

[x foot lat right]
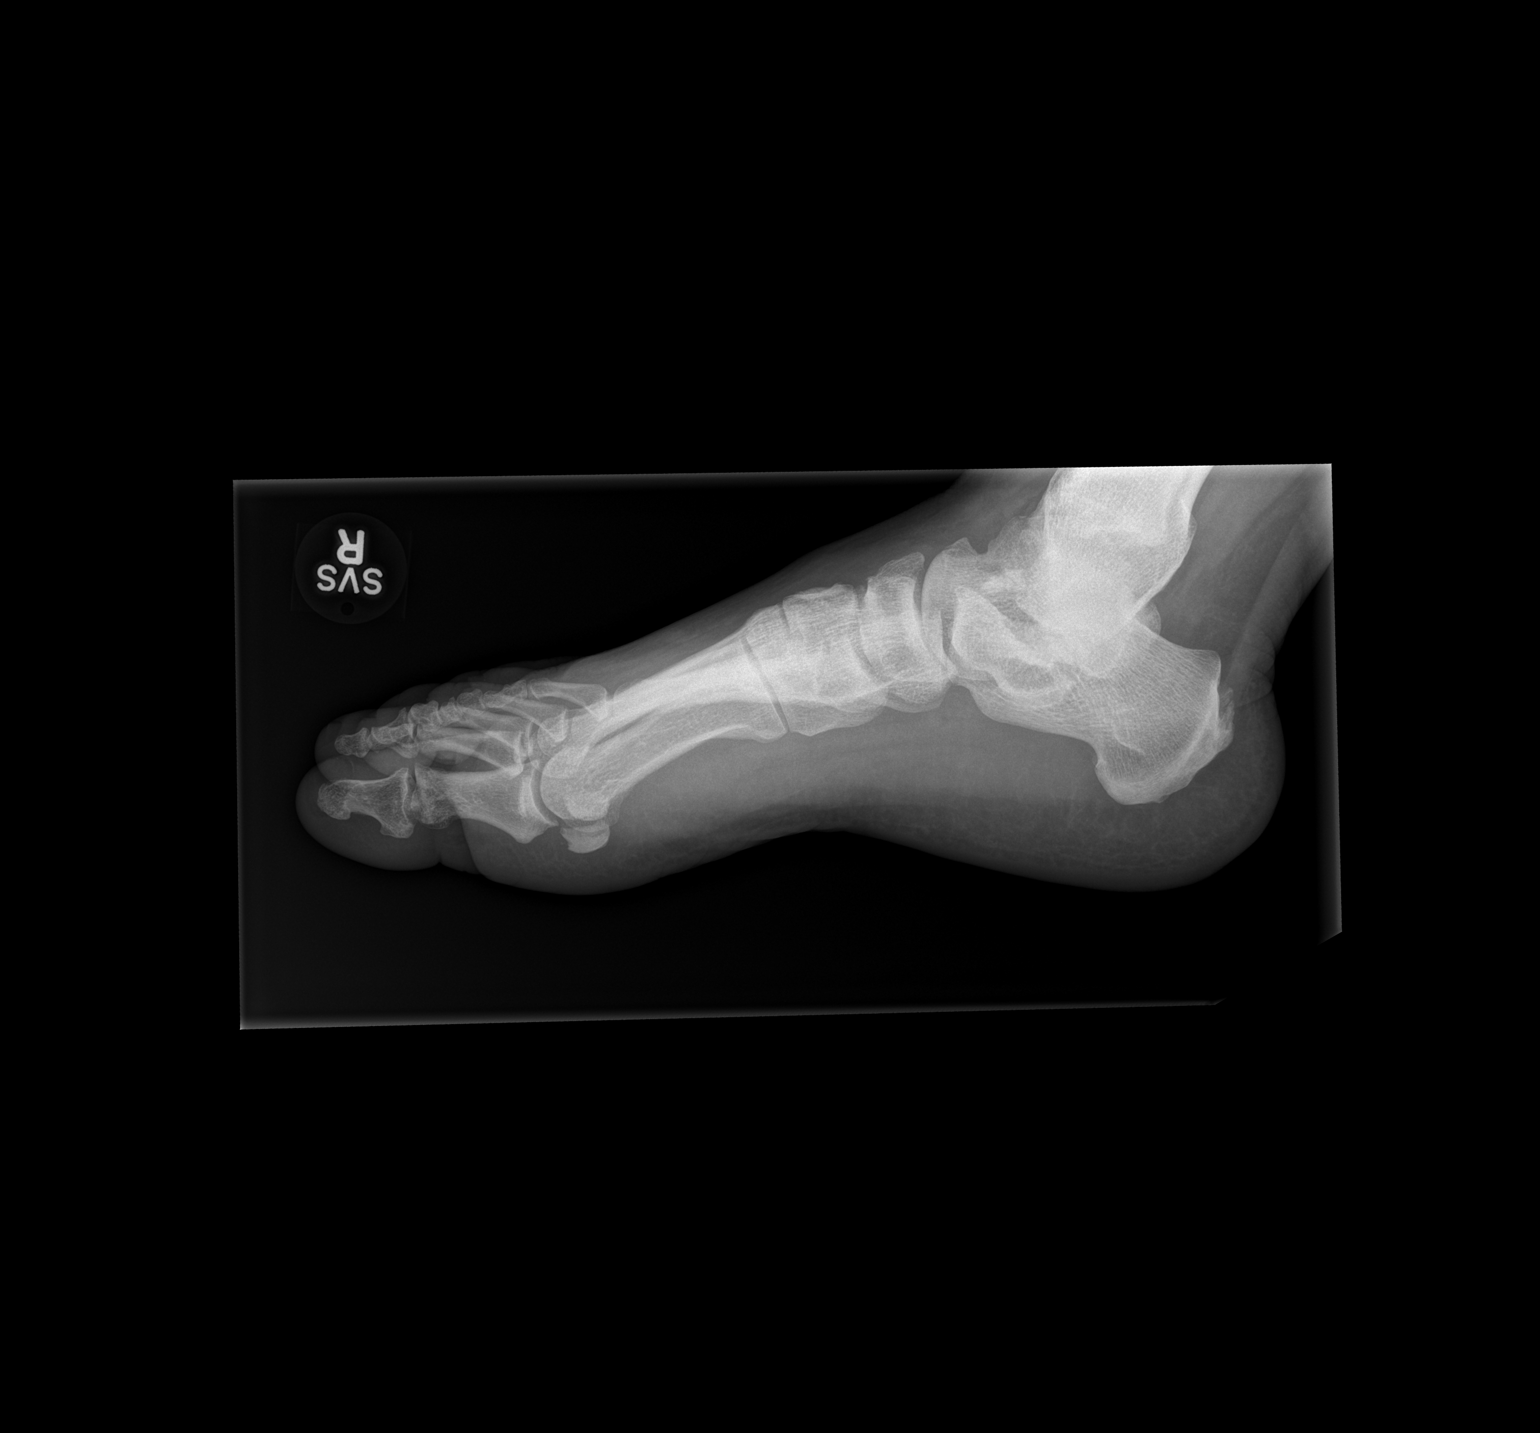

[3 of 3 positions shown; findings below may reference images not displayed]

FINDINGS: No acute fracture deformity or dislocation. Mild first
metatarsophalangeal osteoarthrosis. Small os perineum. Mild
fragmentation of the first interphalangeal joint may reflect remote
injury. No destructive bony lesions. Suspected remote lateral
malleolus avulsion injury, incompletely characterized. Soft tissue
planes are nonsuspicious.
IMPRESSION: No acute fracture deformity or dislocation.

Mild first MTP osteoarthrosis.

  By: Yami Disla

## 2015-12-05 ENCOUNTER — Other Ambulatory Visit: Payer: Self-pay | Admitting: Gastroenterology

## 2015-12-05 DIAGNOSIS — K5720 Diverticulitis of large intestine with perforation and abscess without bleeding: Secondary | ICD-10-CM

## 2015-12-05 MED ORDER — NA SULFATE-K SULFATE-MG SULF 17.5-3.13-1.6 GM/177ML PO SOLN: 1.0000 | ORAL | 0 refills | Status: DC

## 2015-12-06 ENCOUNTER — Encounter
Admission: RE | Disposition: A | Discharge status: Home / Self Care | Payer: Self-pay | Source: Ambulatory Visit | Attending: Gastroenterology

## 2015-12-06 ENCOUNTER — Ambulatory Visit: Payer: BLUE CROSS/BLUE SHIELD | Admitting: Certified Registered Nurse Anesthetist

## 2015-12-06 ENCOUNTER — Ambulatory Visit
Admission: RE | Admit: 2015-12-06 | Discharge: 2015-12-06 | Disposition: A | Discharge status: Home / Self Care | Payer: BLUE CROSS/BLUE SHIELD | Source: Ambulatory Visit | Attending: Gastroenterology | Admitting: Gastroenterology

## 2015-12-06 DIAGNOSIS — K5730 Diverticulosis of large intestine without perforation or abscess without bleeding: Secondary | ICD-10-CM | POA: Insufficient documentation

## 2015-12-06 DIAGNOSIS — Z6841 Body Mass Index (BMI) 40.0 and over, adult: Secondary | ICD-10-CM | POA: Insufficient documentation

## 2015-12-06 DIAGNOSIS — K648 Other hemorrhoids: Secondary | ICD-10-CM | POA: Diagnosis not present

## 2015-12-06 DIAGNOSIS — I2510 Atherosclerotic heart disease of native coronary artery without angina pectoris: Secondary | ICD-10-CM | POA: Insufficient documentation

## 2015-12-06 DIAGNOSIS — G4730 Sleep apnea, unspecified: Secondary | ICD-10-CM | POA: Insufficient documentation

## 2015-12-06 DIAGNOSIS — K5732 Diverticulitis of large intestine without perforation or abscess without bleeding: Secondary | ICD-10-CM

## 2015-12-06 DIAGNOSIS — I252 Old myocardial infarction: Secondary | ICD-10-CM | POA: Diagnosis not present

## 2015-12-06 HISTORY — PX: COLONOSCOPY WITH PROPOFOL: SHX5780

## 2015-12-06 SURGERY — COLONOSCOPY WITH PROPOFOL
Anesthesia: General

## 2015-12-06 MED ORDER — PROPOFOL 500 MG/50ML IV EMUL
INTRAVENOUS | Status: DC | PRN
  Administered 2015-12-06: 140 ug/kg/min via INTRAVENOUS

## 2015-12-06 MED ORDER — SODIUM CHLORIDE 0.9 % IV SOLN
INTRAVENOUS | Status: DC
  Administered 2015-12-06: 08:00:00 via INTRAVENOUS

## 2015-12-06 MED ORDER — PROPOFOL 10 MG/ML IV BOLUS
INTRAVENOUS | Status: DC | PRN
  Administered 2015-12-06: 80 mg via INTRAVENOUS
  Administered 2015-12-06 (×2): 20 mg via INTRAVENOUS
  Administered 2015-12-06: 30 mg via INTRAVENOUS

## 2015-12-06 MED ORDER — LIDOCAINE HCL (CARDIAC) 10 MG/ML IV SOLN
INTRAVENOUS | Status: DC | PRN
  Administered 2015-12-06: 20 mg via INTRAVENOUS

## 2015-12-06 NOTE — Anesthesia Preprocedure Evaluation (Signed)
Anesthesia Evaluation  Patient identified by MRN, date of birth, ID band Patient awake    Reviewed: Allergy & Precautions, NPO status , Patient's Chart, lab work & pertinent test results  Airway Mallampati: II       Dental  (+) Teeth Intact   Pulmonary neg pulmonary ROS, sleep apnea ,     + decreased breath sounds      Cardiovascular Exercise Tolerance: Good  Rhythm:Regular Rate:Normal  Pt denies CAD or MI   Neuro/Psych negative neurological ROS  negative psych ROS   GI/Hepatic negative GI ROS, Neg liver ROS,   Endo/Other  Morbid obesity  Renal/GU negative Renal ROS     Musculoskeletal   Abdominal (+) + obese,   Peds negative pediatric ROS (+)  Hematology negative hematology ROS (+)   Anesthesia Other Findings   Reproductive/Obstetrics                             Anesthesia Physical Anesthesia Plan  ASA: III  Anesthesia Plan: General   Post-op Pain Management:    Induction: Intravenous  Airway Management Planned: Natural Airway and Nasal Cannula  Additional Equipment:   Intra-op Plan:   Post-operative Plan:   Informed Consent: I have reviewed the patients History and Physical, chart, labs and discussed the procedure including the risks, benefits and alternatives for the proposed anesthesia with the patient or authorized representative who has indicated his/her understanding and acceptance.     Plan Discussed with: CRNA  Anesthesia Plan Comments:         Anesthesia Quick Evaluation

## 2015-12-06 NOTE — Op Note (Signed)
[facility] Patient Name: Leonard Pierce Procedure Date: 12/06/2015 8:31 AM MRN: [id number] Account #: [id number] Date of Birth: 01-Jun-1980 Admit Type: Outpatient Age: 35 Room: [facility] ENDO ROOM 4 Gender: Male Note Status: Finalized Procedure:            Colonoscopy Indications:          Follow-up of diverticulitis Providers:            Midge Minium MD, MD Referring MD:         No Local Md, MD (Referring MD) Medicines:            Propofol per Anesthesia Complications:        No immediate complications. Procedure:            Pre-Anesthesia Assessment:                       - Prior to the procedure, a History and Physical was                        performed, and patient medications and allergies were                        reviewed. The patient's tolerance of previous                        anesthesia was also reviewed. The risks and benefits of                        the procedure and the sedation options and risks were                        discussed with the patient. All questions were                        answered, and informed consent was obtained. Prior                        Anticoagulants: The patient has taken no previous                        anticoagulant or antiplatelet agents. ASA Grade                        Assessment: II - A patient with mild systemic disease.                        After reviewing the risks and benefits, the patient was                        deemed in satisfactory condition to undergo the                        procedure.                       After obtaining informed consent, the colonoscope was                        passed under direct vision. Throughout the procedure,  the patient's blood pressure, pulse, and oxygen                        saturations were monitored continuously. The                        Colonoscope was introduced through the anus and                        advanced to the  the cecum, identified by appendiceal                        orifice and ileocecal valve. The colonoscopy was                        performed without difficulty. The patient tolerated the                        procedure well. The quality of the bowel preparation                        was poor. Findings:      The perianal and digital rectal examinations were normal.      A few small-mouthed diverticula were found in the sigmoid colon.      Non-bleeding internal hemorrhoids were found during retroflexion. The       hemorrhoids were Grade I (internal hemorrhoids that do not prolapse).      A moderate amount of stool was found in the entire colon. Impression:           - Preparation of the colon was poor.                       - Diverticulosis in the sigmoid colon.                       - Non-bleeding internal hemorrhoids.                       - Stool in the entire examined colon.                       - No specimens collected. Recommendation:       - Discharge patient to home.                       - Resume previous diet.                       - Continue present medications. Procedure Code(s):    --- Professional ---                       [phone], Colonoscopy, flexible; diagnostic, including                        collection of specimen(s) by brushing or washing, when                        performed (separate procedure) Diagnosis Code(s):    --- Professional ---  K57.32, Diverticulitis of large intestine without                        perforation or abscess without bleeding CPT copyright 2016 American Medical Association. All rights reserved. The codes documented in this report are preliminary and upon coder review may  be revised to meet current compliance requirements. Midge Minium MD, MD 12/06/2015 8:48:51 AM This report has been signed electronically. Number of Addenda: 0 Note Initiated On: 12/06/2015 8:31 AM Scope Withdrawal Time: 0 hours 6 minutes 25 seconds   Total Procedure Duration: 0 hours 8 minutes 44 seconds       [facility]

## 2015-12-06 NOTE — Anesthesia Postprocedure Evaluation (Signed)
Anesthesia Post Note  Patient: Leonard Pierce  Procedure(s) Performed: Procedure(s) (LRB): COLONOSCOPY WITH PROPOFOL (N/A)  Patient location during evaluation: PACU Anesthesia Type: General Level of consciousness: awake Pain management: pain level controlled Vital Signs Assessment: post-procedure vital signs reviewed and stable Respiratory status: nonlabored ventilation Cardiovascular status: stable Anesthetic complications: no    Last Vitals:  Vitals:   12/06/15 0851 12/06/15 0901  BP: (!) 106/41 (!) 89/67  Pulse: 95 74  Resp: (!) 26 (!) 21  Temp: (!) 35.6 C     Last Pain:  Vitals:   12/06/15 0851  TempSrc: Tympanic                 VAN STAVEREN,Giabella Duhart

## 2015-12-06 NOTE — H&P (Signed)
  Lucilla Lame, MD [address] [place], [place] [address] Phone: [phone] Fax : [phone]  Primary Care Physician:  Kristine Garbe, MD Primary Gastroenterologist:  Dr. Allen Norris  Pre-Procedure History & Physical: HPI:  Leonard Pierce is a 35 y.o. male is here for an colonoscopy.   Past Medical History:  Diagnosis Date  . Acute MI    Reports MI at 33, seen at [facility]   . Coronary artery disease   . Diverticulitis 2017  . Gout     Past Surgical History:  Procedure Laterality Date  . NO PAST SURGERIES     per patient 10/13/15    Prior to Admission medications   Medication Sig Start Date End Date Taking? Authorizing Provider  ciprofloxacin (CIPRO) 500 MG tablet Take 1 tablet (500 mg total) by mouth 2 (two) times daily. 11/24/15   Carrie Mew, MD  colchicine 0.6 MG tablet Take 0.6 mg by mouth daily.  09/06/15   Historical Provider, MD  metroNIDAZOLE (FLAGYL) 500 MG tablet Take 1 tablet (500 mg total) by mouth 3 (three) times daily. 11/24/15   Carrie Mew, MD  Na Sulfate-K Sulfate-Mg Sulf (SUPREP BOWEL PREP KIT) 17.5-3.13-1.6 GM/180ML SOLN Take 1 kit by mouth as directed. Take one bottle at 5:00 PM and the second bottle four hours prior to your procedure. 12/05/15   Lucilla Lame, MD  ondansetron (ZOFRAN ODT) 4 MG disintegrating tablet Take 1 tablet (4 mg total) by mouth every 8 (eight) hours as needed for nausea or vomiting. 11/24/15   Carrie Mew, MD    Allergies as of 09/14/2015  . (No Known Allergies)    Family History  Problem Relation Age of Onset  . Non-Hodgkin's lymphoma Mother   . Diabetes Father   . Heart disease Maternal Grandfather     Social History   Social History  . Marital status: Single    Spouse name: N/A  . Number of children: N/A  . Years of education: N/A   Occupational History  . Not on file.   Social History Main Topics  . Smoking status: Never Smoker  . Smokeless tobacco: Never Used  . Alcohol use No  . Drug use: No    . Sexual activity: Not on file   Other Topics Concern  . Not on file   Social History Narrative  . No narrative on file    Review of Systems: See HPI, otherwise negative ROS  Physical Exam: BP 140/76   Pulse 71   Temp (!) 96.6 F (35.9 C) (Tympanic)   Resp 17   Ht '5\' 8"'$  (1.727 m)   Wt (!) 320 lb (145.2 kg)   SpO2 100%   BMI 48.66 kg/m  General:   Alert,  pleasant and cooperative in NAD Head:  Normocephalic and atraumatic. Neck:  Supple; no masses or thyromegaly. Lungs:  Clear throughout to auscultation.    Heart:  Regular rate and rhythm. Abdomen:  Soft, nontender and nondistended. Normal bowel sounds, without guarding, and without rebound.   Neurologic:  Alert and  oriented x4;  grossly normal neurologically.  Impression/Plan: Leonard Pierce is here for an colonoscopy to be performed for follow up of diverticulitis  Risks, benefits, limitations, and alternatives regarding  colonoscopy have been reviewed with the patient.  Questions have been answered.  All parties agreeable.   Lucilla Lame, MD  12/06/2015, 8:16 AM

## 2015-12-06 NOTE — Transfer of Care (Signed)
Immediate Anesthesia Transfer of Care Note  Patient: Leonard CampionJason A Cravens  Procedure(s) Performed: Procedure(s): COLONOSCOPY WITH PROPOFOL (N/A)  Patient Location: PACU  Anesthesia Type:General  Level of Consciousness: sedated  Airway & Oxygen Therapy: Patient Spontanous Breathing and Patient connected to nasal cannula oxygen  Post-op Assessment: Report given to RN and Post -op Vital signs reviewed and stable  Post vital signs: Reviewed and stable  Last Vitals:  Vitals:   12/06/15 0813  BP: 140/76  Pulse: 71  Resp: 17  Temp: (!) 35.9 C    Last Pain:  Vitals:   12/06/15 0813  TempSrc: Tympanic         Complications: No apparent anesthesia complications

## 2015-12-07 ENCOUNTER — Encounter: Payer: Self-pay | Admitting: Gastroenterology

## 2015-12-13 ENCOUNTER — Ambulatory Visit (INDEPENDENT_AMBULATORY_CARE_PROVIDER_SITE_OTHER): Payer: BLUE CROSS/BLUE SHIELD | Admitting: Surgery

## 2015-12-13 ENCOUNTER — Ambulatory Visit
Admission: RE | Admit: 2015-12-13 | Discharge: 2015-12-13 | Disposition: A | Discharge status: Home / Self Care | Payer: BLUE CROSS/BLUE SHIELD | Source: Ambulatory Visit | Attending: Surgery | Admitting: Surgery

## 2015-12-13 ENCOUNTER — Telehealth: Payer: Self-pay | Admitting: Gastroenterology

## 2015-12-13 ENCOUNTER — Other Ambulatory Visit: Payer: Self-pay

## 2015-12-13 ENCOUNTER — Encounter: Payer: Self-pay | Admitting: Surgery

## 2015-12-13 VITALS — BP 147/90 | HR 77 | Temp 98.4°F | Ht 68.0 in | Wt 364.5 lb

## 2015-12-13 DIAGNOSIS — K5732 Diverticulitis of large intestine without perforation or abscess without bleeding: Secondary | ICD-10-CM

## 2015-12-13 DIAGNOSIS — R600 Localized edema: Secondary | ICD-10-CM | POA: Insufficient documentation

## 2015-12-13 MED ORDER — SULFAMETHOXAZOLE-TRIMETHOPRIM 800-160 MG PO TABS: 1.0000 | ORAL_TABLET | Freq: Two times a day (BID) | ORAL | 0 refills | Status: AC

## 2015-12-13 MED ORDER — METRONIDAZOLE 500 MG PO TABS: 500.0000 mg | ORAL_TABLET | Freq: Three times a day (TID) | ORAL | 0 refills | Status: AC

## 2015-12-13 MED ORDER — IOPAMIDOL (ISOVUE-300) INJECTION 61%
125.0000 mL | Freq: Once | INTRAVENOUS | Status: AC | PRN
  Administered 2015-12-13: 125 mL via INTRAVENOUS

## 2015-12-13 NOTE — Patient Instructions (Signed)
We need for you to go to [facility] to have a Stat CT scan done. Please go to the lab when they are finished with the scan. Please call our office if you have any questions or concerns.

## 2015-12-13 NOTE — Addendum Note (Signed)
Addended by: Myrtie HawkPISCOYA, Lorenso Quirino L on: 12/13/2015 07:16 PM   Modules accepted: Orders

## 2015-12-13 NOTE — Telephone Encounter (Signed)
Work note completed. Advised [facility] [place] letter is available for printing today. Pt has an appt with the surgeon today.

## 2015-12-13 NOTE — Telephone Encounter (Signed)
Patient needs a note to return to work after his colonoscopy. Patient will pick up the note today in the [place] office.  [phone]

## 2015-12-13 NOTE — Progress Notes (Addendum)
12/13/2015  HPI: This 35 year old male with a history of diverticulitis on 8/4 who had been admitted to the hospital for IV antibiotics and conservative management. On CT scan he did have a contained perforation but with no abscess. He was seen in the office after discharge I feet times now and he is required another visit to the emergency room in September and October. He had a colonoscopy done with Dr. Servando SnareWohl on 11/7 which showed diverticulosis of the sigmoid colon with nonbleeding internal hemorrhoids, though the bowel prep was poor and there was stool throughout the colon.  Today, he describes that he still having intermittent episodes of abdominal pain in the left lower quadrant which he describes as being as severe as his initial presentation in August. He denies any any fevers but describes feeling chills. He denies any nausea, vomiting, other areas of abdominal pain. He does report having some mild constipation and has a bowel movement every other day. He uses MiraLAX 2-3 times a week.  Vital signs: BP (!) 147/90   Pulse 77   Temp 98.4 F (36.9 C) (Oral)   Ht 5\' 8"  (1.727 m)   Wt (!) 165.3 kg (364 lb 8 oz)   BMI 55.42 kg/m    Physical Exam: Constitutional: No acute distress Cardiac:  Regular rhythm and rate Pulm: No respiratory distress with lungs clear Abdomen: Soft, obese, nondistended with tenderness to palpation in the left lower quadrant which causes grimacing on deep palpation. No peritoneal signs at this point.  Assessment/Plan: 35 year old male with a history of, acute diverticulitis with contained perforation in August 2017. The patient has had recurrent intermittent symptoms presents today still having left lower quadrant pain episodes.  Given his symptoms have ordered a new set of CBC and BMP for the patient as well as a repeat CT scan of the abdomen and pelvis which will be done today in order to assess for recurrence of symptoms versus new abscess development. Depending  on these studies the patient may require admission to the hospital versus a new course of oral antibiotics. Have discussed with the patient that if this CT scan is completely negative and his labs are completely negative he may just have residual pain from his diverticulitis.  He may then follow-up in the next week or 2 to start further discussions for surgery.   Howie IllJose Luis Shanicka Oldenkamp, MD [facility]   Addendum:  CT scan with po/iv contrast was obtainted, showing mild recurrent diverticulitis, without any complications.  Will start another course of oral antibiotics for the patient and he will follow up next week.

## 2015-12-14 ENCOUNTER — Telehealth: Payer: Self-pay

## 2015-12-14 NOTE — Telephone Encounter (Signed)
LVM for patient to call office regarding CT scan results.

## 2015-12-15 ENCOUNTER — Ambulatory Visit: Payer: Self-pay | Admitting: Surgery

## 2015-12-15 NOTE — Telephone Encounter (Signed)
Patient notified of CT results and Antibiotics that were sent to pharmacy by DR.Piscoya. Patient has follow up appointment 12/31/15 @ 9:00 am.

## 2015-12-30 ENCOUNTER — Ambulatory Visit: Payer: Self-pay | Admitting: Surgery

## 2016-01-02 ENCOUNTER — Ambulatory Visit: Payer: Self-pay | Admitting: General Surgery

## 2016-01-03 ENCOUNTER — Telehealth: Payer: Self-pay

## 2016-01-03 NOTE — Telephone Encounter (Signed)
Called patient to reschedule him and wasn't able to leave a voicemail since it was full.

## 2016-03-14 ENCOUNTER — Emergency Department
Admission: EM | Admit: 2016-03-14 | Discharge: 2016-03-14 | Disposition: A | Discharge status: Home / Self Care | Payer: BLUE CROSS/BLUE SHIELD | Attending: Emergency Medicine | Admitting: Emergency Medicine

## 2016-03-14 ENCOUNTER — Encounter: Payer: Self-pay | Admitting: Emergency Medicine

## 2016-03-14 DIAGNOSIS — M109 Gout, unspecified: Secondary | ICD-10-CM

## 2016-03-14 DIAGNOSIS — M10071 Idiopathic gout, right ankle and foot: Secondary | ICD-10-CM | POA: Diagnosis not present

## 2016-03-14 DIAGNOSIS — M79674 Pain in right toe(s): Secondary | ICD-10-CM | POA: Diagnosis present

## 2016-03-14 DIAGNOSIS — I2510 Atherosclerotic heart disease of native coronary artery without angina pectoris: Secondary | ICD-10-CM | POA: Diagnosis not present

## 2016-03-14 LAB — URIC ACID: Uric Acid, Serum: 8.2 mg/dL — ABNORMAL HIGH (ref 4.4–7.6)

## 2016-03-14 MED ORDER — TRAMADOL HCL 50 MG PO TABS: 50.0000 mg | ORAL_TABLET | Freq: Four times a day (QID) | ORAL | 0 refills | Status: DC | PRN

## 2016-03-14 MED ORDER — INDOMETHACIN 50 MG PO CAPS: 50.0000 mg | ORAL_CAPSULE | Freq: Two times a day (BID) | ORAL | 0 refills | Status: DC

## 2016-03-14 MED ORDER — COLCHICINE 0.6 MG PO TABS
0.6000 mg | ORAL_TABLET | Freq: Two times a day (BID) | ORAL | 0 refills | Status: DC
Start: 2016-03-14 — End: 2016-11-27

## 2016-03-14 MED ORDER — INDOMETHACIN 50 MG PO CAPS
50.0000 mg | ORAL_CAPSULE | Freq: Once | ORAL | Status: AC
Start: 2016-03-14 — End: 2016-03-14
  Administered 2016-03-14: 50 mg via ORAL
  Filled 2016-03-14: qty 1

## 2016-03-14 MED ORDER — TRAMADOL HCL 50 MG PO TABS
50.0000 mg | ORAL_TABLET | Freq: Once | ORAL | Status: AC
  Administered 2016-03-14: 50 mg via ORAL
  Filled 2016-03-14: qty 1

## 2016-03-14 NOTE — ED Provider Notes (Signed)
[facility] Emergency Department Provider Note   ____________________________________________   First MD Initiated Contact with Patient 03/14/16 [phone]     (approximate)  I have reviewed the triage vital signs and the nursing notes.   HISTORY  Chief Complaint Toe Pain    HPI Leonard Pierce is a 36 y.o. male who comes into the hospital today with right-sided toe pain. The patient has a history of gout. He reports that it started last week and he drank some cherry juice and it got better. He reports that yesterday though it flared up. He didn't take anything because he was at work when it started up. He decided to come into the hospital today for evaluation. The patient reports that the pain is about 8 out of 10 in intensity. He has had gout before in the same toe. He has no fevers, chills, nausea, vomiting, chest pain.   Past Medical History:  Diagnosis Date  . Acute MI    Reports MI at 19, seen at [facility]   . Coronary artery disease   . Diverticulitis 2017  . Gout     Patient Active Problem List   Diagnosis Date Noted  . Diverticulitis of colon     Past Surgical History:  Procedure Laterality Date  . COLONOSCOPY WITH PROPOFOL N/A 12/06/2015   Procedure: COLONOSCOPY WITH PROPOFOL;  Surgeon: Midge Minium, MD;  Location: [facility] ENDOSCOPY;  Service: Endoscopy;  Laterality: N/A;  . NO PAST SURGERIES     per patient 10/13/15    Prior to Admission medications   Medication Sig Start Date End Date Taking? Authorizing Provider  colchicine 0.6 MG tablet Take 1 tablet (0.6 mg total) by mouth 2 (two) times daily. 03/14/16 03/14/17  Rebecka Apley, MD  indomethacin (INDOCIN) 50 MG capsule Take 1 capsule (50 mg total) by mouth 2 (two) times daily with a meal. 03/14/16   Rebecka Apley, MD  traMADol (ULTRAM) 50 MG tablet Take 1 tablet (50 mg total) by mouth every 6 (six) hours as needed. 03/14/16   Rebecka Apley, MD    Allergies Patient has no known  allergies.  Family History  Problem Relation Age of Onset  . Non-Hodgkin's lymphoma Mother   . Diabetes Father   . Heart disease Maternal Grandfather     Social History Social History  Substance Use Topics  . Smoking status: Never Smoker  . Smokeless tobacco: Never Used  . Alcohol use No    Review of Systems Constitutional: No fever/chills Eyes: No visual changes. ENT: No sore throat. Cardiovascular: Denies chest pain. Respiratory: Denies shortness of breath. Gastrointestinal: No abdominal pain.  No nausea, no vomiting.  No diarrhea.  No constipation. Genitourinary: Negative for dysuria. Musculoskeletal: Right toe pain Skin: Negative for rash. Neurological: Negative for headaches, focal weakness or numbness.  10-point ROS otherwise negative.  ____________________________________________   PHYSICAL EXAM:  VITAL SIGNS: ED Triage Vitals  Enc Vitals Group     BP 03/14/16 0139 (!) 167/85     Pulse Rate 03/14/16 0139 76     Resp 03/14/16 0139 18     Temp 03/14/16 0139 97.7 F (36.5 C)     Temp Source 03/14/16 0139 Oral     SpO2 03/14/16 0139 97 %     Weight --      Height --      Head Circumference --      Peak Flow --      Pain Score 03/14/16 0138 8  Pain Loc --      Pain Edu? --      Excl. in GC? --     Constitutional: Alert and oriented. Well appearing and in Mild distress. Eyes: Conjunctivae are normal. PERRL. EOMI. Head: Atraumatic. Nose: No congestion/rhinnorhea. Mouth/Throat: Mucous membranes are moist.  Oropharynx non-erythematous. Cardiovascular: Normal rate, regular rhythm. Grossly normal heart sounds.  Good peripheral circulation. Respiratory: Normal respiratory effort.  No retractions. Lungs CTAB. Gastrointestinal: Soft and nontender. No distention. Positive bowel sounds Musculoskeletal: No lower extremity tenderness nor edema.  Some mild erythema to the right toe and some mild swelling with tenderness to palpation Neurologic:  Normal speech  and language.  Skin:  Skin is warm, dry and intact.  Psychiatric: Mood and affect are normal.   ____________________________________________   LABS (all labs ordered are listed, but only abnormal results are displayed)  Labs Reviewed  URIC ACID - Abnormal; Notable for the following:       Result Value   Uric Acid, Serum 8.2 (*)    All other components within normal limits   ____________________________________________  EKG  none ____________________________________________  RADIOLOGY  none ____________________________________________   PROCEDURES  Procedure(s) performed: None  Procedures  Critical Care performed: No  ____________________________________________   INITIAL IMPRESSION / ASSESSMENT AND PLAN / ED COURSE  Pertinent labs & imaging results that were available during my care of the patient were reviewed by me and considered in my medical decision making (see chart for details).  This is a 36 year old male with a history of gout who comes into the hospital today with some right toe pain. The patient did have a uric acid level that was elevated. I will give the patient some tramadol indomethacin. I will discharge him with some colchicine, indomethacin and tramadol. He will be discharged home to follow-up with his primary care physician.      ____________________________________________   FINAL CLINICAL IMPRESSION(S) / ED DIAGNOSES  Final diagnoses:  Acute gout involving toe of right foot, unspecified cause      NEW MEDICATIONS STARTED DURING THIS VISIT:  New Prescriptions   COLCHICINE 0.6 MG TABLET    Take 1 tablet (0.6 mg total) by mouth 2 (two) times daily.   INDOMETHACIN (INDOCIN) 50 MG CAPSULE    Take 1 capsule (50 mg total) by mouth 2 (two) times daily with a meal.   TRAMADOL (ULTRAM) 50 MG TABLET    Take 1 tablet (50 mg total) by mouth every 6 (six) hours as needed.     Note:  This document was prepared using Dragon voice recognition  software and may include unintentional dictation errors.    Rebecka ApleyAllison P Weda Baumgarner, MD 03/14/16 [id number]

## 2016-03-14 NOTE — ED Notes (Signed)
Patient presents with right toe pain and swelling, onset

## 2016-03-14 NOTE — ED Triage Notes (Signed)
Patient ambulatory to triage with steady gait, without difficulty or distress noted; pt report right great toe pain since this morning; st hx gout

## 2016-03-14 NOTE — ED Notes (Signed)
Patient presents with right toe pain, onset two days ago. Patient with history of gout. Toe is swollen and painful to touch. Patient admits to large consumption of tuna as he is on a diet.

## 2016-05-29 ENCOUNTER — Encounter: Payer: Self-pay | Admitting: Emergency Medicine

## 2016-05-29 ENCOUNTER — Emergency Department
Admission: EM | Admit: 2016-05-29 | Discharge: 2016-05-29 | Disposition: A | Discharge status: Home / Self Care | Payer: Self-pay | Attending: Emergency Medicine | Admitting: Emergency Medicine

## 2016-05-29 ENCOUNTER — Emergency Department: Payer: Self-pay

## 2016-05-29 DIAGNOSIS — Y929 Unspecified place or not applicable: Secondary | ICD-10-CM | POA: Insufficient documentation

## 2016-05-29 DIAGNOSIS — S93402A Sprain of unspecified ligament of left ankle, initial encounter: Secondary | ICD-10-CM

## 2016-05-29 DIAGNOSIS — I252 Old myocardial infarction: Secondary | ICD-10-CM | POA: Insufficient documentation

## 2016-05-29 DIAGNOSIS — I2510 Atherosclerotic heart disease of native coronary artery without angina pectoris: Secondary | ICD-10-CM | POA: Insufficient documentation

## 2016-05-29 DIAGNOSIS — Y939 Activity, unspecified: Secondary | ICD-10-CM | POA: Insufficient documentation

## 2016-05-29 DIAGNOSIS — S82892A Other fracture of left lower leg, initial encounter for closed fracture: Secondary | ICD-10-CM

## 2016-05-29 DIAGNOSIS — X58XXXA Exposure to other specified factors, initial encounter: Secondary | ICD-10-CM | POA: Insufficient documentation

## 2016-05-29 DIAGNOSIS — Y999 Unspecified external cause status: Secondary | ICD-10-CM | POA: Insufficient documentation

## 2016-05-29 DIAGNOSIS — Z79899 Other long term (current) drug therapy: Secondary | ICD-10-CM | POA: Insufficient documentation

## 2016-05-29 DIAGNOSIS — S92152A Displaced avulsion fracture (chip fracture) of left talus, initial encounter for closed fracture: Secondary | ICD-10-CM | POA: Insufficient documentation

## 2016-05-29 MED ORDER — TRAMADOL HCL 50 MG PO TABS: 50.0000 mg | ORAL_TABLET | Freq: Four times a day (QID) | ORAL | 0 refills | Status: DC | PRN

## 2016-05-29 MED ORDER — OXYCODONE-ACETAMINOPHEN 5-325 MG PO TABS
1.0000 | ORAL_TABLET | Freq: Once | ORAL | Status: AC
  Administered 2016-05-29: 1 via ORAL
  Filled 2016-05-29: qty 1

## 2016-05-29 NOTE — ED Provider Notes (Signed)
[facility] Emergency Department Provider Note   ____________________________________________   First MD Initiated Contact with Patient 05/29/16 0217     (approximate)  I have reviewed the triage vital signs and the nursing notes.   HISTORY  Chief Complaint Ankle Pain    HPI Leonard Pierce is a 36 y.o. male who comes into the hospital today with some left ankle pain.The patient reports that he was at a ball field this evening he arrived home and to hurt. He reports that it was mainly the back of his ankle and it was throbbing. He denies any injury but reports that he was running around. He reports that he took 2 Aleve at home but the pain was so severe that he decided to come in. He did not notice any swelling or redness. He reports that his pain is currently a 6 out of 10 in intensity. It is not as bad as it was when he first came in. The patient reports that he has never had pain like this on the left ankle before. The patient does have a history of gout and reports that he has had pain in his right ankle before. The patient was concerned about this pain so he decided to come into the hospital for evaluation.   Past Medical History:  Diagnosis Date  . Acute MI (HCC)    Reports MI at 19, seen at [facility]   . Coronary artery disease   . Diverticulitis 2017  . Gout     Patient Active Problem List   Diagnosis Date Noted  . Diverticulitis of colon     Past Surgical History:  Procedure Laterality Date  . COLONOSCOPY WITH PROPOFOL N/A 12/06/2015   Procedure: COLONOSCOPY WITH PROPOFOL;  Surgeon: Midge Minium, MD;  Location: [facility] ENDOSCOPY;  Service: Endoscopy;  Laterality: N/A;  . NO PAST SURGERIES     per patient 10/13/15    Prior to Admission medications   Medication Sig Start Date End Date Taking? Authorizing Provider  colchicine 0.6 MG tablet Take 1 tablet (0.6 mg total) by mouth 2 (two) times daily. 03/14/16 03/14/17  Rebecka Apley, MD  indomethacin  (INDOCIN) 50 MG capsule Take 1 capsule (50 mg total) by mouth 2 (two) times daily with a meal. 03/14/16   Rebecka Apley, MD  traMADol (ULTRAM) 50 MG tablet Take 1 tablet (50 mg total) by mouth every 6 (six) hours as needed. 03/14/16   Rebecka Apley, MD  traMADol (ULTRAM) 50 MG tablet Take 1 tablet (50 mg total) by mouth every 6 (six) hours as needed. 05/29/16   Rebecka Apley, MD    Allergies Patient has no known allergies.  Family History  Problem Relation Age of Onset  . Non-Hodgkin's lymphoma Mother   . Diabetes Father   . Heart disease Maternal Grandfather     Social History Social History  Substance Use Topics  . Smoking status: Never Smoker  . Smokeless tobacco: Never Used  . Alcohol use 1.2 oz/week    2 Cans of beer per week    Review of Systems  Constitutional: No fever/chills Eyes: No visual changes. ENT: No sore throat. Cardiovascular: Denies chest pain. Respiratory: Denies shortness of breath. Gastrointestinal: No abdominal pain.  No nausea, no vomiting.  No diarrhea.  No constipation. Genitourinary: Negative for dysuria. Musculoskeletal: Left ankle pain Skin: Negative for rash. Neurological: Negative for headaches, focal weakness or numbness.   ____________________________________________   PHYSICAL EXAM:  VITAL SIGNS: ED  Triage Vitals  Enc Vitals Group     BP 05/29/16 0010 (!) 155/90     Pulse Rate 05/29/16 0010 87     Resp 05/29/16 0010 (!) 24     Temp 05/29/16 0010 98.5 F (36.9 C)     Temp Source 05/29/16 0010 Oral     SpO2 05/29/16 0010 100 %     Weight 05/29/16 0011 (!) 320 lb (145.2 kg)     Height 05/29/16 0011  (1.753 m)     Head Circumference --      Peak Flow --      Pain Score 05/29/16 0010 10     Pain Loc --      Pain Edu? --      Excl. in GC? --     Constitutional: Alert and oriented. Well appearing and in Mild distress. Eyes: Conjunctivae are normal. PERRL. EOMI. Head: Atraumatic. Nose: No  congestion/rhinnorhea. Mouth/Throat: Mucous membranes are moist.  Oropharynx non-erythematous. Cardiovascular: Normal rate, regular rhythm. Grossly normal heart sounds.  Good peripheral circulation. Respiratory: Normal respiratory effort.  No retractions. Lungs CTAB. Gastrointestinal: Soft and nontender. No distention. Positive bowel sounds Musculoskeletal: No warmth or swelling to the patient's ankle. He does have some pain to palpation at the lateral ankle and posteriorly. The patient reports that he also has pain around the front of his ankle and medially. The patient has some pain with passive range of motion. Neurologic:  Normal speech and language.  Skin:  Skin is warm, dry and intact. Marland Kitchen Psychiatric: Mood and affect are normal.   ____________________________________________   LABS (all labs ordered are listed, but only abnormal results are displayed)  Labs Reviewed - No data to display ____________________________________________  EKG  none ____________________________________________  RADIOLOGY  Left ankle xray ____________________________________________   PROCEDURES  Procedure(s) performed: None  Procedures  Critical Care performed: No  ____________________________________________   INITIAL IMPRESSION / ASSESSMENT AND PLAN / ED COURSE  Pertinent labs & imaging results that were available during my care of the patient were reviewed by me and considered in my medical decision making (see chart for details).  This is a 35 year old male who comes into the hospital today with some left ankle pain the patient reports that he was running around and then developed some pain. I will send the patient for an x-ray and give the patient a Percocet. I will reassess the patient once received his images.  Clinical Course as of May 29 348  Tue May 29, 2016  0345 1. No definite acute osseous abnormality 2. Probable old fracture deformity of the medial malleolus and possible  old fracture of the posterior malleolus. A tiny bone density posteriorly at the joint could relate to an age indeterminate avulsion fracture.   DG Ankle Complete Left [AW]    Clinical Course User Index [AW] Rebecka Apley, MD    I will place the patient in the ankle Velcro splint. I'll also give him some crutches. The patient should follow up with orthopedic surgery he does have some age-indeterminate avulsion fractures of the posterior malleolus. He will be discharged to home to follow up.  ____________________________________________   FINAL CLINICAL IMPRESSION(S) / ED DIAGNOSES  Final diagnoses:  Sprain of left ankle, unspecified ligament, initial encounter  Closed avulsion fracture of left ankle, initial encounter      NEW MEDICATIONS STARTED DURING THIS VISIT:  New Prescriptions   TRAMADOL (ULTRAM) 50 MG TABLET    Take 1 tablet (50 mg total) by  mouth every 6 (six) hours as needed.     Note:  This document was prepared using Dragon voice recognition software and may include unintentional dictation errors.    Rebecka Apley, MD 05/29/16 [phone]

## 2016-05-29 NOTE — ED Triage Notes (Addendum)
Pt presents to ED with sudden onset of left ankle pain. Denies injury. Has hx of gout. +pedal pulse and sensation

## 2016-11-01 ENCOUNTER — Encounter: Payer: Self-pay | Admitting: Emergency Medicine

## 2016-11-01 ENCOUNTER — Emergency Department
Admission: EM | Admit: 2016-11-01 | Discharge: 2016-11-01 | Disposition: A | Discharge status: Home / Self Care | Payer: BLUE CROSS/BLUE SHIELD | Attending: Emergency Medicine | Admitting: Emergency Medicine

## 2016-11-01 DIAGNOSIS — M7672 Peroneal tendinitis, left leg: Secondary | ICD-10-CM | POA: Insufficient documentation

## 2016-11-01 DIAGNOSIS — M79672 Pain in left foot: Secondary | ICD-10-CM

## 2016-11-01 DIAGNOSIS — I252 Old myocardial infarction: Secondary | ICD-10-CM | POA: Diagnosis not present

## 2016-11-01 DIAGNOSIS — Z79899 Other long term (current) drug therapy: Secondary | ICD-10-CM | POA: Diagnosis not present

## 2016-11-01 DIAGNOSIS — I2510 Atherosclerotic heart disease of native coronary artery without angina pectoris: Secondary | ICD-10-CM | POA: Insufficient documentation

## 2016-11-01 DIAGNOSIS — M25572 Pain in left ankle and joints of left foot: Secondary | ICD-10-CM | POA: Diagnosis present

## 2016-11-01 DIAGNOSIS — M7670 Peroneal tendinitis, unspecified leg: Secondary | ICD-10-CM

## 2016-11-01 MED ORDER — PREDNISONE 10 MG (21) PO TBPK: ORAL_TABLET | ORAL | 0 refills | Status: DC

## 2016-11-01 MED ORDER — DEXAMETHASONE SODIUM PHOSPHATE 10 MG/ML IJ SOLN
10.0000 mg | Freq: Once | INTRAMUSCULAR | Status: AC
  Administered 2016-11-01: 10 mg via INTRAMUSCULAR
  Filled 2016-11-01: qty 1

## 2016-11-01 NOTE — ED Triage Notes (Signed)
Presents with pain to left heel and /ankle since yesterday  States he has had similar pain over the past year   Denies injury  Ambulates with limp d/t pain

## 2016-11-01 NOTE — Discharge Instructions (Signed)
Take medication as prescribed. Return to emergency department if symptoms worsen and follow-up with PCP as needed.    He may use ice to alleviate pain and inflammation as needed.  If symptoms do not resolve follow up with podiatry. The contact information is included in her discharge instruction.

## 2016-11-01 NOTE — ED Provider Notes (Signed)
[facility] Emergency Department Provider Note   ____________________________________________   I have reviewed the triage vital signs and the nursing notes.   HISTORY  Chief Complaint Foot Pain    HPI Leonard Pierce is a 36 y.o. male presents immersed department with left posterior lateral ankle pain that developed yesterday without traumatic injury. Patient reports having similar pain approximately one year ago without traumatic injury resolving on its own. Patient reports the pain has developed while playing softball and he has come in for evaluation to determine why it has recurred. Patient localizes pain just below the lateral malleoli approximately over the peroneal tendons. Palpation to the peroneal tendons produces severe pain. Patient denies changing shoe wear or having any changes in his stay where he is standing versus sitting. Patient denies fever, chills, headache, vision changes, chest pain, chest tightness, shortness of breath, abdominal pain, nausea and vomiting.  Past Medical History:  Diagnosis Date  . Acute MI (HCC)    Reports MI at 19, seen at [facility]   . Coronary artery disease   . Diverticulitis 2017  . Gout     Patient Active Problem List   Diagnosis Date Noted  . Diverticulitis of colon     Past Surgical History:  Procedure Laterality Date  . COLONOSCOPY WITH PROPOFOL N/A 12/06/2015   Procedure: COLONOSCOPY WITH PROPOFOL;  Surgeon: Midge Minium, MD;  Location: [facility] ENDOSCOPY;  Service: Endoscopy;  Laterality: N/A;  . NO PAST SURGERIES     per patient 10/13/15    Prior to Admission medications   Medication Sig Start Date End Date Taking? Authorizing Provider  colchicine 0.6 MG tablet Take 1 tablet (0.6 mg total) by mouth 2 (two) times daily. 03/14/16 03/14/17  Rebecka Apley, MD  indomethacin (INDOCIN) 50 MG capsule Take 1 capsule (50 mg total) by mouth 2 (two) times daily with a meal. 03/14/16   Rebecka Apley, MD    predniSONE (STERAPRED UNI-PAK 21 TAB) 10 MG (21) TBPK tablet Take 6 tablets on day 1. Take 5 tablets on day 2. Take 4 tablets on day 3. Take 3 tablets on day 4. Take 2 tablets on day 5. Take 1 tablets on day 6. 11/01/16   Evaluna Utke M, PA-C  traMADol (ULTRAM) 50 MG tablet Take 1 tablet (50 mg total) by mouth every 6 (six) hours as needed. 03/14/16   Rebecka Apley, MD  traMADol (ULTRAM) 50 MG tablet Take 1 tablet (50 mg total) by mouth every 6 (six) hours as needed. 05/29/16   Rebecka Apley, MD    Allergies Patient has no known allergies.  Family History  Problem Relation Age of Onset  . Non-Hodgkin's lymphoma Mother   . Diabetes Father   . Heart disease Maternal Grandfather     Social History Social History  Substance Use Topics  . Smoking status: Never Smoker  . Smokeless tobacco: Never Used  . Alcohol use 1.2 oz/week    2 Cans of beer per week    Review of Systems Constitutional: Negative for fever/chills Eyes: No visual changes. Musculoskeletal: Positive for left heel pain. Skin: Negative for rash. Neurological: Negative for headaches.   Able to ambulate. ____________________________________________   PHYSICAL EXAM:  VITAL SIGNS: ED Triage Vitals  Enc Vitals Group     BP 11/01/16 1229 131/71     Pulse Rate 11/01/16 1229 (!) 58     Resp 11/01/16 1229 20     Temp 11/01/16 1229 97.8 F (36.6 C)  Temp Source 11/01/16 1229 Oral     SpO2 11/01/16 1229 99 %     Weight 11/01/16 1224 (!) 336 lb (152.4 kg)     Height 11/01/16 1224  (1.727 m)     Head Circumference --      Peak Flow --      Pain Score 11/01/16 1223 8     Pain Loc --      Pain Edu? --      Excl. in GC? --     Constitutional: Alert and oriented. Well appearing and in no acute distress.  Eyes: Conjunctivae are normal. PERRL. EOMI  Head: Normocephalic and atraumatic.  Cardiovascular: Normal rate, regular rhythm.  Good peripheral circulation. Respiratory: Normal respiratory effort  without tachypnea or retractions. Lungs CTAB.  Cardiovascular: Normal rate, regular rhythm. Normal distal pulses. Gastrointestinal: Bowel sounds 4 quadrants. Soft and nontender to palpation. Musculoskeletal: left ankle range of motion all planes intact without limitation. Left ankle and foot strength within normal limits. Palpable tenderness over the left peroneal tendons below the left malleoli. Mild swelling noted along the same area of the tenderness. Negative tenderness or swelling along the Achilles tendon or insertion of the Achilles tendon. Negative tenderness over the plantar fascia or tendinous attachment of the fascia. Neurologic: Normal speech and language. Skin:  Skin is warm, dry and intact. No rash noted. Psychiatric: Mood and affect are normal. Speech and behavior are normal. Patient exhibits appropriate insight and judgement.  ____________________________________________   LABS (all labs ordered are listed, but only abnormal results are displayed)  Labs Reviewed - No data to display ____________________________________________  EKG none ____________________________________________  RADIOLOGY none ____________________________________________   PROCEDURES  Procedure(s) performed: no    Critical Care performed: no ____________________________________________   INITIAL IMPRESSION / ASSESSMENT AND PLAN / ED COURSE  Pertinent labs & imaging results that were available during my care of the patient were reviewed by me and considered in my medical decision making (see chart for details).  Patient presents to emergency department with left posterior lateral foot pain. History and physical exam findings are reassuring symptoms are consistent with peroneal tendinitis. Patient noted slight decrease in symptoms following Decadron given during the course of care in the emergency department. Patient will be prescribed present taper. Patient advised to follow up with podiatry  if symptoms do not resolve or return to the emergency department if symptoms return or worsen. Patient informed of clinical course, understand medical decision-making process, and agree with plan. _____________________________________   FINAL CLINICAL IMPRESSION(S) / ED DIAGNOSES  Final diagnoses:  Foot pain, left  Peroneal tendinitis, unspecified laterality       NEW MEDICATIONS STARTED DURING THIS VISIT:  Discharge Medication List as of 11/01/2016  1:59 PM    START taking these medications   Details  predniSONE (STERAPRED UNI-PAK 21 TAB) 10 MG (21) TBPK tablet Take 6 tablets on day 1. Take 5 tablets on day 2. Take 4 tablets on day 3. Take 3 tablets on day 4. Take 2 tablets on day 5. Take 1 tablets on day 6., Print         Note:  This document was prepared using Dragon voice recognition software and may include unintentional dictation errors.    Clois Comber, PA-C 11/01/16 1735    Merrily Brittle, MD 11/02/16 [phone]

## 2016-11-19 ENCOUNTER — Ambulatory Visit (INDEPENDENT_AMBULATORY_CARE_PROVIDER_SITE_OTHER): Payer: BLUE CROSS/BLUE SHIELD | Admitting: Family Medicine

## 2016-11-19 ENCOUNTER — Encounter: Payer: Self-pay | Admitting: Family Medicine

## 2016-11-19 DIAGNOSIS — K5732 Diverticulitis of large intestine without perforation or abscess without bleeding: Secondary | ICD-10-CM

## 2016-11-19 DIAGNOSIS — R030 Elevated blood-pressure reading, without diagnosis of hypertension: Secondary | ICD-10-CM | POA: Diagnosis not present

## 2016-11-19 DIAGNOSIS — M7670 Peroneal tendinitis, unspecified leg: Secondary | ICD-10-CM

## 2016-11-19 DIAGNOSIS — I152 Hypertension secondary to endocrine disorders: Secondary | ICD-10-CM | POA: Insufficient documentation

## 2016-11-19 LAB — COMPREHENSIVE METABOLIC PANEL
ALBUMIN: 4.2 g/dL (ref 3.5–5.2)
ALK PHOS: 64 U/L (ref 39–117)
ALT: 32 U/L (ref 0–53)
AST: 32 U/L (ref 0–37)
BILIRUBIN TOTAL: 0.8 mg/dL (ref 0.2–1.2)
BUN: 9 mg/dL (ref 6–23)
CALCIUM: 9.7 mg/dL (ref 8.4–10.5)
CO2: 29 mEq/L (ref 19–32)
Chloride: 103 mEq/L (ref 96–112)
Creatinine, Ser: 1.2 mg/dL (ref 0.40–1.50)
GFR: 86.92 mL/min (ref >60.00)
GLUCOSE: 107 mg/dL — AB (ref 70–99)
POTASSIUM: 3.9 meq/L (ref 3.5–5.1)
Sodium: 139 mEq/L (ref 135–145)
TOTAL PROTEIN: 7.7 g/dL (ref 6.0–8.3)

## 2016-11-19 LAB — LIPID PANEL
CHOLESTEROL: 176 mg/dL (ref 0–200)
HDL: 43.9 mg/dL (ref >39.00)
LDL Cholesterol: 116 mg/dL — ABNORMAL HIGH (ref 0–99)
NONHDL: 131.6
TRIGLYCERIDES: 76 mg/dL (ref 0.0–149.0)
Total CHOL/HDL Ratio: 4
VLDL: 15.2 mg/dL (ref 0.0–40.0)

## 2016-11-19 LAB — CBC
HEMATOCRIT: 41.2 % (ref 39.0–52.0)
Hemoglobin: 13.5 g/dL (ref 13.0–17.0)
MCHC: 32.7 g/dL (ref 30.0–36.0)
MCV: 86.5 fl (ref 78.0–100.0)
Platelets: 233 10*3/uL (ref 150.0–400.0)
RBC: 4.77 Mil/uL (ref 4.22–5.81)
RDW: 14.5 % (ref 11.5–15.5)
WBC: 5.8 10*3/uL (ref 4.0–10.5)

## 2016-11-19 LAB — HEMOGLOBIN A1C: HEMOGLOBIN A1C: 6 % (ref 4.6–6.5)

## 2016-11-19 LAB — TSH: TSH: 1.7 u[IU]/mL (ref 0.35–4.50)

## 2016-11-19 NOTE — Assessment & Plan Note (Signed)
Elevated today though no history in the past. Suspect related to his weight loss supplement. He will discontinue this. It did have a fair amount of caffeine in it on review of the bottle that he brought in. He'll return in 1 week for BP check.

## 2016-11-19 NOTE — Progress Notes (Signed)
Leonard Rumps, MD Phone: [phone]  Leonard Pierce is a 36 y.o. male who presents today for new patient visit.  Obesity: Patient is drastically changed his exercise routine. He's been going to boot camp daily for the last 6 weeks. He is trying to get back down on his weight. He's changed his diet drastically as well. He typically has a synovial in the morning. Some type of fish with vegetables at lunch. Protein shake in the afternoon. Baked chicken and asparagus night. Reports is down 21 pounds in the last 6 weeks.  Elevated blood pressure: No history of this. No chest pain or shortness of breath. No edema. He does report he's been taking a weight loss supplement through his trainer. He reports a history of irregular heartbeat in the past and underwent stress test per his report. He notes he was not advised that had a heart attack.  He was evaluated in the emergency room for peroneal tendinitis. He notes this discomfort has resolved.  Does report a history of diverticulitis. He saw GI for this. He has had no recurrence.   Active Ambulatory Problems    Diagnosis Date Noted  . Diverticulitis of colon   . Morbid obesity ([place]) 11/19/2016  . Elevated BP without diagnosis of hypertension 11/19/2016  . Peroneal tendinitis 11/19/2016   Resolved Ambulatory Problems    Diagnosis Date Noted  . No Resolved Ambulatory Problems   Past Medical History:  Diagnosis Date  . Acute MI ([place])   . Coronary artery disease   . Diverticulitis 2017  . Gout     Family History  Problem Relation Age of Onset  . Non-Hodgkin's lymphoma Mother   . Hypertension Mother   . Diabetes Father   . Heart disease Maternal Grandfather   . Diabetes Maternal Grandfather   . Renal Disease Maternal Grandfather   . Diabetes Maternal Grandmother   . Heart disease Maternal Grandmother   . Diabetes Paternal Grandmother   . Heart disease Paternal Grandmother   . Renal Disease Paternal Grandmother   . Diabetes  Paternal Grandfather   . Heart disease Paternal Grandfather 10  . Renal Disease Paternal Grandfather     Social History   Social History  . Marital status: Single    Spouse name: N/A  . Number of children: N/A  . Years of education: N/A   Occupational History  . Not on file.   Social History Main Topics  . Smoking status: Never Smoker  . Smokeless tobacco: Never Used  . Alcohol use 1.2 oz/week    2 Cans of beer per week  . Drug use: No  . Sexual activity: Not on file   Other Topics Concern  . Not on file   Social History Narrative  . No narrative on file    ROS  General:  Negative for nexplained weight loss, fever Skin: Negative for new or changing mole, sore that won't heal HEENT: Negative for trouble hearing, trouble seeing, ringing in ears, mouth sores, hoarseness, change in voice, dysphagia. CV:  Negative for chest pain, dyspnea, edema, palpitations Resp: Negative for cough, dyspnea, hemoptysis GI: Negative for nausea, vomiting, diarrhea, constipation, abdominal pain, melena, hematochezia. GU: Negative for dysuria, incontinence, urinary hesitance, hematuria, vaginal or penile discharge, polyuria, sexual difficulty, lumps in testicle or breasts MSK: Negative for muscle cramps or aches, joint pain or swelling Neuro: Negative for headaches, weakness, numbness, dizziness, passing out/fainting Psych: Negative for depression, anxiety, memory problems  Objective  Physical Exam Vitals:   11/19/16  0919  BP: (!) 138/98  Pulse: 65  Temp: 98 F (36.7 C)  SpO2: 97%    BP Readings from Last 3 Encounters:  11/19/16 (!) 138/98  11/01/16 131/71  05/29/16 (!) 155/90   Wt Readings from Last 3 Encounters:  11/19/16 (!) 351 lb 6.4 oz (159.4 kg)  11/01/16 (!) 336 lb (152.4 kg)  05/29/16 (!) 320 lb (145.2 kg)    Physical Exam  Constitutional: No distress.  HENT:  Head: Normocephalic and atraumatic.  Mouth/Throat: Oropharynx is clear and moist. No oropharyngeal  exudate.  Eyes: Pupils are equal, round, and reactive to light. Conjunctivae are normal.  Cardiovascular: Normal rate, regular rhythm and normal heart sounds.   Pulmonary/Chest: Effort normal and breath sounds normal.  Abdominal: Soft. Bowel sounds are normal. He exhibits no distension. There is no tenderness. There is no rebound and no guarding.  Musculoskeletal: He exhibits no edema.  Neurological: He is alert. Gait normal.  Skin: Skin is warm and dry. He is not diaphoretic.  Psychiatric: Mood and affect normal.     Assessment/Plan:   Morbid obesity ([place]) Patient reports he has lost 21 pounds since starting diet and exercise changes. He did ask about dietary medications though I discussed that given that he has been able lose weight with diet and exercise we should stick to that. If he stalls at some point we could consider medication. Blood pressure potentially could limit medications he could use.  Elevated BP without diagnosis of hypertension Elevated today though no history in the past. Suspect related to his weight loss supplement. He will discontinue this. It did have a fair amount of caffeine in it on review of the bottle that he brought in. He'll return in 1 week for BP check.  Diverticulitis of colon No recurrence. Discussed if he has recurrence needs to call us or GI or be evaluated immediately.  Peroneal tendinitis Has resolved. Monitor for recurrence.   Orders Placed This Encounter  Procedures  . Lipid Profile  . Comp Met (CMET)  . HgB A1c  . TSH  . CBC    Leonard Rumps, MD [place]

## 2016-11-19 NOTE — Assessment & Plan Note (Signed)
Patient reports he has lost 21 pounds since starting diet and exercise changes. He did ask about dietary medications though I discussed that given that he has been able lose weight with diet and exercise we should stick to that. If he stalls at some point we could consider medication. Blood pressure potentially could limit medications he could use.

## 2016-11-19 NOTE — Patient Instructions (Addendum)
Nice to see you. Please continue with diet and exercise. Please discontinue the weight loss supplement. We will recheck your blood pressure in a week or so and see if it still elevated.

## 2016-11-19 NOTE — Assessment & Plan Note (Signed)
No recurrence. Discussed if he has recurrence needs to call us or GI or be evaluated immediately.

## 2016-11-19 NOTE — Assessment & Plan Note (Signed)
Has resolved. Monitor for recurrence.

## 2016-11-20 ENCOUNTER — Telehealth: Payer: Self-pay | Admitting: Family Medicine

## 2016-11-20 NOTE — Telephone Encounter (Signed)
See result note.  

## 2016-11-20 NOTE — Telephone Encounter (Signed)
Pt called back returning your call. Pt states that you may leave results on his voicemail. Please advise, thank you!  Call pt @ [phone]

## 2016-11-27 ENCOUNTER — Ambulatory Visit: Payer: BLUE CROSS/BLUE SHIELD

## 2016-11-27 ENCOUNTER — Ambulatory Visit (INDEPENDENT_AMBULATORY_CARE_PROVIDER_SITE_OTHER): Payer: BLUE CROSS/BLUE SHIELD | Admitting: Family Medicine

## 2016-11-27 ENCOUNTER — Encounter: Payer: Self-pay | Admitting: Family Medicine

## 2016-11-27 VITALS — BP 138/92 | HR 89 | Temp 97.6°F | Wt 340.0 lb

## 2016-11-27 DIAGNOSIS — G8929 Other chronic pain: Secondary | ICD-10-CM | POA: Diagnosis not present

## 2016-11-27 DIAGNOSIS — M79672 Pain in left foot: Secondary | ICD-10-CM

## 2016-11-27 DIAGNOSIS — M6528 Calcific tendinitis, other site: Secondary | ICD-10-CM | POA: Insufficient documentation

## 2016-11-27 MED ORDER — MELOXICAM 15 MG PO TABS: 15.0000 mg | ORAL_TABLET | Freq: Every day | ORAL | 0 refills | Status: DC

## 2016-11-27 NOTE — Assessment & Plan Note (Signed)
Acute onset issue. Suspect possible tendinitis or inflammation as cause. Not consistent with gout. We'll trial meloxicam. 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 Given this we will try to get him into see sports medicine this week to ensure he has the best opportunity to play in that tournament if possible.

## 2016-11-27 NOTE — Progress Notes (Signed)
  Leonard AlarEric Lincoln Kleiner, MD Phone: [phone]  Leonard Pierce is a 36 y.o. male who presents today for same-day visit.  Patient notes onset of posterior left heel pain at the insertion site of his Achilles tendon on Sunday. No injury. Notes it's a fairly sharp pain. He's had similar pain before in other parts of his foot. He notes no swelling. He's tried ice and Aleve and ibuprofen with little benefit. Does have a history of gout though this is not consistent with this.   ROS see history of present illness  Objective  Physical Exam Vitals:   11/27/16 1051  BP: (!) 138/92  Pulse: 89  Temp: 97.6 F (36.4 C)  SpO2: 98%    BP Readings from Last 3 Encounters:  11/27/16 (!) 138/92  11/19/16 (!) 138/98  11/01/16 131/71   Wt Readings from Last 3 Encounters:  11/27/16 (!) 340 lb (154.2 kg)  11/19/16 (!) 351 lb 6.4 oz (159.4 kg)  11/01/16 (!) 336 lb (152.4 kg)    Physical Exam  Constitutional: No distress.  Cardiovascular: Normal rate, regular rhythm and normal heart sounds.   Pulmonary/Chest: Effort normal and breath sounds normal.  Musculoskeletal: He exhibits no edema.  Left posterior heel at the insertion site of the Achilles tendon with tenderness, no swelling or erythema, no warmth, no Achilles tendon tenderness, tendon appears to be intact, no other tenderness in his left foot, right foot normal, bilateral feet warm and well perfused  Neurological: He is alert.  Skin: Skin is warm and dry. He is not diaphoretic.     Assessment/Plan: Please see individual problem list.  Pain of left heel Acute onset issue. Suspect possible tendinitis or inflammation as cause. Not consistent with gout. We'll trial meloxicam. 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 Given this we will try to get him into see sports medicine this week to ensure he has the best opportunity to play in that tournament if possible.  Suspect blood pressure slightly elevated related to  pain. Recheck in one month.  Orders Placed This Encounter  Procedures  . Ambulatory referral to Sports Medicine    Referral Priority:   Routine    Referral Type:   Consultation    Number of Visits Requested:   1    Meds ordered this encounter  Medications  . DISCONTD: meloxicam (MOBIC) 15 MG tablet    Sig: Take 1 tablet (15 mg total) by mouth daily.    Dispense:  30 tablet    Refill:  0  . meloxicam (MOBIC) 15 MG tablet    Sig: Take 1 tablet (15 mg total) by mouth daily.    Dispense:  30 tablet    Refill:  0     Leonard AlarEric Brienna Bass, MD [facility]

## 2016-11-27 NOTE — Patient Instructions (Signed)
Nice to see you. We'll treat her with a prescription strength anti-inflammatory. We will get you to see our sports medicine physicians to evaluate further.

## 2016-11-28 ENCOUNTER — Ambulatory Visit (INDEPENDENT_AMBULATORY_CARE_PROVIDER_SITE_OTHER): Payer: BLUE CROSS/BLUE SHIELD | Admitting: Family Medicine

## 2016-11-28 ENCOUNTER — Encounter: Payer: Self-pay | Admitting: Family Medicine

## 2016-11-28 DIAGNOSIS — M6528 Calcific tendinitis, other site: Secondary | ICD-10-CM | POA: Diagnosis not present

## 2016-11-28 MED ORDER — DICLOFENAC SODIUM 2 % TD SOLN: 1.0000 "application " | Freq: Two times a day (BID) | TRANSDERMAL | 2 refills | Status: DC

## 2016-11-28 NOTE — Patient Instructions (Signed)
Thank you for coming in,   Please try to obtain the heel lift in place and year shoes.  Please try to wear compression.  Try to use the meloxicam for 10 days and then as needed.  You can use the Pennsaid and place of the meloxicam.  Please try the exercises in a couple weeks.   Please feel free to call with any questions or concerns at any time, at [phone]. --Dr. Jordan LikesSchmitz

## 2016-11-28 NOTE — Progress Notes (Signed)
Leonard Pierce - 36 y.o. male MRN [id number]  Date of birth: 1980/08/03  SUBJECTIVE:  Including CC & ROS.  Chief Complaint  Patient presents with  . Left foot pain    Pain is located near his heel and achilles. Pain started on 11/25/16. Denies injury. He started taking Mobic yesterday, prescribed by Dr. Birdie Sons.    Leonard Pierce is a 36 y.o. male that is presenting with left heel pain. The pain is occurring on the left foot at the insertion of the Achilles. The pain is significant after working all day standing on concrete. He plays softball every weekend and does a camp for exercise. The pain is sharp and stabbing in nature. It is localized to this area. He has tried ice and Aleve. Has not had any history of gout. Denies any inciting event or injury. Denies previous surgery on his left ankle.  Independent review of his left ankle shows a plantar and Achilles spur on the calcaneus.   Review of Systems  Constitutional: Negative for fever.  Musculoskeletal: Positive for gait problem. Negative for arthralgias and joint swelling.  Skin: Negative for color change.  Neurological: Negative for weakness and numbness.    HISTORY: Past Medical, Surgical, Social, and Family History Reviewed & Updated per EMR.   Pertinent Historical Findings include:  Past Medical History:  Diagnosis Date  . Acute MI (HCC)    Reports MI at 19, [facility] at [facility]   . Coronary artery disease   . Diverticulitis 2017  . Gout     Past Surgical History:  Procedure Laterality Date  . COLONOSCOPY WITH PROPOFOL N/A 12/06/2015   Procedure: COLONOSCOPY WITH PROPOFOL;  Surgeon: Midge Minium, MD;  Location: [facility] ENDOSCOPY;  Service: Endoscopy;  Laterality: N/A;  . NO PAST SURGERIES     per patient 10/13/15  . TONSILLECTOMY      No Known Allergies  Family History  Problem Relation Age of Onset  . Non-Hodgkin's lymphoma Mother   . Hypertension Mother   . Diabetes Father   . Heart disease Maternal Grandfather   . Diabetes  Maternal Grandfather   . Renal Disease Maternal Grandfather   . Diabetes Maternal Grandmother   . Heart disease Maternal Grandmother   . Diabetes Paternal Grandmother   . Heart disease Paternal Grandmother   . Renal Disease Paternal Grandmother   . Diabetes Paternal Grandfather   . Heart disease Paternal Grandfather 69  . Renal Disease Paternal Grandfather      Social History   Social History  . Marital status: Single    Spouse name: N/A  . Number of children: N/A  . Years of education: N/A   Occupational History  . Not on file.   Social History Main Topics  . Smoking status: Never Smoker  . Smokeless tobacco: Never Used  . Alcohol use 1.2 oz/week    2 Cans of beer per week  . Drug use: No  . Sexual activity: Not on file   Other Topics Concern  . Not on file   Social History Narrative  . No narrative on file     PHYSICAL EXAM:  VS: BP 138/88 (BP Location: Left Arm, Patient Position: Sitting, Cuff Size: Large)   Pulse 64   Temp 98.2 F (36.8 C) (Oral)   Ht 5\' 9"  (1.753 m)   Wt (!) 349 lb (158.3 kg)   SpO2 98%   BMI 51.54 kg/m  Physical Exam Gen: NAD, alert, cooperative with exam, well-appearing ENT: normal lips, normal  nasal mucosa,  Eye: normal EOM, normal conjunctiva and lids CV:  no edema, +2 pedal pulses   Resp: no accessory muscle use, non-labored,  Skin: no rashes, no areas of induration  Neuro: normal tone, normal sensation to touch Psych:  normal insight, alert and oriented MSK:  Left ankle: Tenderness to palpation at the insertion of the Achilles. No abnormal thickening or had less deformity. Some limited dorsiflexion Negative anterior drawer. Loss of the transverse arch. Neutral longitudinal arch. No abnormal callus formation on the plantar aspect. Plantarflexion with Janee Morn test. Neurovascularly intact   Limited ultrasound: Left ankle:  Achilles is mildly thickened. Calcific change at the insertion of the Achilles. No evidence of  retrocalcaneal bursitis  Summary: Findings suggestive achilles enthesitis   Ultrasound and interpretation by Clare Gandy, MD              ASSESSMENT & PLAN:   Calcific Achilles tendinitis Doesn't appear to have any mid substance pain. Likely associated with the calcific change at the insertion.  - pennsaid provided - continue mobic - heel lifts  - encouraged compression  - Encouraged vitamin D  - counseled on home exercises once pain has diminished. If pain doesn't improve then could consider placing in a cam walker

## 2016-11-28 NOTE — Assessment & Plan Note (Signed)
Doesn't appear to have any mid substance pain. Likely associated with the calcific change at the insertion.  - pennsaid provided - continue mobic - heel lifts  - encouraged compression  - Encouraged vitamin D  - counseled on home exercises once pain has diminished. If pain doesn't improve then could consider placing in a cam walker

## 2016-11-30 ENCOUNTER — Ambulatory Visit (INDEPENDENT_AMBULATORY_CARE_PROVIDER_SITE_OTHER): Payer: BLUE CROSS/BLUE SHIELD | Admitting: Family Medicine

## 2016-11-30 ENCOUNTER — Encounter: Payer: Self-pay | Admitting: Family Medicine

## 2016-11-30 DIAGNOSIS — K529 Noninfective gastroenteritis and colitis, unspecified: Secondary | ICD-10-CM | POA: Diagnosis not present

## 2016-11-30 MED ORDER — ONDANSETRON HCL 4 MG PO TABS: 4.0000 mg | ORAL_TABLET | Freq: Three times a day (TID) | ORAL | 1 refills | Status: DC | PRN

## 2016-11-30 NOTE — Progress Notes (Signed)
Sx started 2 days ago.  Vomiting.  Intermittent vomiting since then.  Diarrhea started last night.  Profuse diarrhea started after onset of vomiting.  No blood in stool.  No blood in vomit.  Taking ginger ale and holding that down.  Stomach in "knots", coming and going.  No rash.  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  No oral lesions.  Last vomited this AM.  Last UOP just PTA today, normal amount of urine but darker than normal.  Fecal urgency.    Meds, vitals, and allergies reviewed.   ROS: Per HPI unless specifically indicated in ROS section   GEN: nad, alert and oriented, he looks like he does not feel well but is not in distress. HEENT: mucous membranes moist NECK: supple w/o LA CV: rrr. PULM: ctab, no inc wob ABD: soft, +bs, not ttp, no rebound. EXT: no edema SKIN: ext well perfused.

## 2016-11-30 NOTE — Patient Instructions (Signed)
Rest and fluids, sips of ginger ale.  Take zofran if needed for nausea.  Out of work.  Hold meloxicam for now.  No dairy until feeling better.  Take care.  Glad to see you.

## 2016-12-02 DIAGNOSIS — K529 Noninfective gastroenteritis and colitis, unspecified: Secondary | ICD-10-CM | POA: Insufficient documentation

## 2016-12-02 NOTE — Assessment & Plan Note (Signed)
Likely diagnosis. Nontoxic. Okay for outpatient follow-up. Able to hold down some fluids. Recent urine output noted.Differential diagnosis discussed with patient. Zofran as needed. Sips of fluids. Avoid dairy. He agrees. See AVS.

## 2016-12-12 ENCOUNTER — Ambulatory Visit: Payer: Self-pay | Admitting: *Deleted

## 2016-12-12 NOTE — Telephone Encounter (Signed)
Called in c/o left foot pain (my gout is flaring up).  He is taking Tramadol which is helping the pain.   He is out of the Indomethacin he usually takes.   This was a prior Rx from Dr. Pecola Leisureeese at another practice.  He has seen Dr. Birdie SonsSonnenberg but not for gout so he is not able to refill his Indomethacin Rx.   I suggested he go to the urgent care since he is not able to be scheduled any time soon for his gout and med. Refill.  He acknowledged he would go to the urgent care today. I routed a note back to the nurse pool to see if they can get him an appt with Dr. Birdie SonsSonnenberg.  Reason for Disposition . Pain in the big toe joint  Answer Assessment - Initial Assessment Questions 1. ONSET: "When did the pain start?"      Yesterday morning 2. LOCATION: "Where is the pain located?"      Top of my left foot 3. PAIN: "How bad is the pain?"    (Scale 1-10; or mild, moderate, severe)   -  MILD (1-3): doesn't interfere with normal activities    -  MODERATE (4-7): interferes with normal activities (e.g., work or school) or awakens from sleep, limping    -  SEVERE (8-10): excruciating pain, unable to do any normal activities, unable to walk     Limping.   8 without the Tramadol  Down to 6 with Tramadol. 4. WORK OR EXERCISE: "Has there been any recent work or exercise that involved this part of the body?"      Working out in gym.   Not sure it is causing this.   I'm sure it's my gout. 5. CAUSE: "What do you think is causing the foot pain?"     Gout 6. OTHER SYMPTOMS: "Do you have any other symptoms?" (e.g., leg pain, rash, fever, numbness)     Last night it really hurt. 7. PREGNANCY: "Is there any chance you are pregnant?" "When was your last menstrual period?"     N/A  Protocols used: FOOT PAIN-A-AH

## 2016-12-12 NOTE — Telephone Encounter (Signed)
noted 

## 2016-12-24 ENCOUNTER — Other Ambulatory Visit: Payer: Self-pay | Admitting: Family Medicine

## 2017-01-07 ENCOUNTER — Other Ambulatory Visit: Payer: Self-pay | Admitting: Family Medicine

## 2017-03-11 ENCOUNTER — Encounter: Payer: Self-pay | Admitting: Internal Medicine

## 2017-03-11 ENCOUNTER — Ambulatory Visit: Payer: BLUE CROSS/BLUE SHIELD | Admitting: Internal Medicine

## 2017-03-11 VITALS — BP 136/84 | HR 74 | Temp 98.0°F | Ht 69.0 in | Wt 365.8 lb

## 2017-03-11 DIAGNOSIS — E1122 Type 2 diabetes mellitus with diabetic chronic kidney disease: Secondary | ICD-10-CM | POA: Insufficient documentation

## 2017-03-11 DIAGNOSIS — R7303 Prediabetes: Secondary | ICD-10-CM | POA: Diagnosis not present

## 2017-03-11 DIAGNOSIS — M722 Plantar fascial fibromatosis: Secondary | ICD-10-CM

## 2017-03-11 DIAGNOSIS — R5383 Other fatigue: Secondary | ICD-10-CM | POA: Diagnosis not present

## 2017-03-11 DIAGNOSIS — R42 Dizziness and giddiness: Secondary | ICD-10-CM

## 2017-03-11 DIAGNOSIS — R197 Diarrhea, unspecified: Secondary | ICD-10-CM | POA: Diagnosis not present

## 2017-03-11 DIAGNOSIS — R112 Nausea with vomiting, unspecified: Secondary | ICD-10-CM

## 2017-03-11 DIAGNOSIS — K529 Noninfective gastroenteritis and colitis, unspecified: Secondary | ICD-10-CM | POA: Diagnosis not present

## 2017-03-11 DIAGNOSIS — E119 Type 2 diabetes mellitus without complications: Secondary | ICD-10-CM | POA: Insufficient documentation

## 2017-03-11 LAB — POC INFLUENZA A&B (BINAX/QUICKVUE)
Influenza A, POC: NEGATIVE
Influenza B, POC: NEGATIVE

## 2017-03-11 MED ORDER — ONDANSETRON HCL 4 MG PO TABS: 4.0000 mg | ORAL_TABLET | Freq: Three times a day (TID) | ORAL | 0 refills | Status: DC | PRN

## 2017-03-11 NOTE — Progress Notes (Signed)
Chief Complaint  Patient presents with  . Dizziness  . Nausea  . Emesis  . Diarrhea   Sick visit  Pt c/o dizziness since Thursday. 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 He has also had n/v/d since Thursday of last week, Friday he was somewhat better and then went to church Sunday and then to bed. He c/o fatigue, decreased po intake. His coworker have had the flu He has been coughing phelgm green and had hot/cold sweats. He has vomited total of 4-5 x since Thrusday and had 3 episodes of diarrhea since Thursday. Stool is green brown. He tried cheerios which gave him diarrhea and mashed potatoes ok. He denies ab pain.  reveiwed CT ab/pelvis 11/2015 +diverticulitis and colonoscopy 2017 diverticulosis and hemorrhoids.   He c/o left heel pain at least since 05/2016 he did see MD in [place] but does not remember the name reviewed Xray 05/2016 +old fracture in ankle   Prediabetes reviewed labs 10/2016 disc exercise and healthy diet choices FH DM    Review of Systems  Constitutional: Positive for chills and malaise/fatigue. Negative for fever and weight loss.  HENT: Negative for hearing loss.   Eyes:       No vision changes   Respiratory: Negative for shortness of breath.   Cardiovascular: Negative for chest pain.  Gastrointestinal: Positive for diarrhea, nausea and vomiting. Negative for abdominal pain and blood in stool.  Musculoskeletal:       +left heel pain   Skin: Negative for rash.  Neurological: Positive for dizziness.  Psychiatric/Behavioral: Negative for memory loss.   Past Medical History:  Diagnosis Date  . Acute MI (HCC)    [facility] at 19, [facility]   . Coronary artery disease   . Diverticulitis 2017  . Gout    Past Surgical History:  Procedure Laterality Date  . COLONOSCOPY WITH PROPOFOL N/A 12/06/2015   Procedure: COLONOSCOPY WITH PROPOFOL;  Surgeon: Midge Miniumarren Wohl, MD;  Location: [facility] ENDOSCOPY;  Service: Endoscopy;  Laterality: N/A;  .  NO PAST SURGERIES     per patient 10/13/15  . TONSILLECTOMY     Family History  Problem Relation Age of Onset  . Non-Hodgkin's lymphoma Mother   . Hypertension Mother   . Diabetes Father   . Heart disease Maternal Grandfather   . Diabetes Maternal Grandfather   . Renal Disease Maternal Grandfather   . Diabetes Maternal Grandmother   . Heart disease Maternal Grandmother   . Diabetes Paternal Grandmother   . Heart disease Paternal Grandmother   . Renal Disease Paternal Grandmother   . Diabetes Paternal Grandfather   . Heart disease Paternal Grandfather [id number]  . Renal Disease Paternal Grandfather    Social History   Socioeconomic History  . Marital status: Single    Spouse name: Not on file  . Number of children: Not on file  . Years of education: Not on file  . Highest education level: Not on file  Social Needs  . Financial resource strain: Not on file  . Food insecurity - worry: Not on file  . Food insecurity - inability: Not on file  . Transportation needs - medical: Not on file  . Transportation needs - non-medical: Not on file  Occupational History  . Not on file  Tobacco Use  . Smoking status: Never Smoker  . Smokeless tobacco: Never Used  Substance and Sexual Activity  . Alcohol use: Yes    Alcohol/week: 1.2 oz  Types: 2 Cans of beer per week  . Drug use: No  . Sexual activity: Not on file  Other Topics Concern  . Not on file  Social History Narrative  . Not on file   Current Meds  Medication Sig  . meloxicam (MOBIC) 15 MG tablet Take 1 tablet (15 mg total) by mouth daily.  . ondansetron (ZOFRAN) 4 MG tablet Take 1 tablet (4 mg total) by mouth every 8 (eight) hours as needed for nausea or vomiting.  . traMADol (ULTRAM) 50 MG tablet Take 1 tablet (50 mg total) by mouth every 6 (six) hours as needed.  . [DISCONTINUED] ondansetron (ZOFRAN) 4 MG tablet Take 1 tablet (4 mg total) by mouth every 8 (eight) hours as needed for nausea or vomiting.   No Known  Allergies No results found for this or any previous visit (from the past 2160 hour(s)). Objective  Body mass index is 54.02 kg/m. Wt Readings from Last 3 Encounters:  03/11/17 (!) 365 lb 12.8 oz (165.9 kg)  11/30/16 (!) 350 lb (158.8 kg)  11/28/16 (!) 349 lb (158.3 kg)   Temp Readings from Last 3 Encounters:  03/11/17 98 F (36.7 C) (Oral)  11/30/16 98.4 F (36.9 C) (Oral)  11/28/16 98.2 F (36.8 C) (Oral)   BP Readings from Last 3 Encounters:  03/11/17 136/84  11/30/16 138/80  11/28/16 138/88   Pulse Readings from Last 3 Encounters:  03/11/17 74  11/30/16 79  11/28/16 64   O2 sat room air 99%  Physical Exam  Constitutional: He is oriented to person, place, and time and well-developed, well-nourished, and in no distress. Vital signs are normal.  HENT:  Head: Normocephalic and atraumatic.  Mouth/Throat: Oropharynx is clear and moist and mucous membranes are normal.  Eyes: Conjunctivae are normal. Pupils are equal, round, and reactive to light.  Cardiovascular: Normal rate, regular rhythm and normal heart sounds.  Pulmonary/Chest: Effort normal and breath sounds normal.  Abdominal: Soft. Bowel sounds are normal. There is no tenderness.  Musculoskeletal:       Feet:  Neurological: He is alert and oriented to person, place, and time. Gait normal. Gait normal.  Skin: Skin is warm, dry and intact.  Psychiatric: Mood, memory, affect and judgment normal.  Nursing note and vitals reviewed.   Assessment   1. Fatigue and dizziness (orthostatics neg today 144/86 HR 68 lying, sitting 140/86 HR 73, standing 126/90 HR 74) with n/v/d could be gastroenteritis, flu negative, r/o hep A  2. Left heel pain ddx spur, plantar fascitis  3. Prediabetes  4. HM Plan  1. BRAT diet supportive care  peptobismol rec, prn Zofran, low sugar gatorade, ginger ale  Check CEMT, CBC, stool culture and C diff, and hep A today  F/u in 1-2 weeks if not better  Note for work go back [employer].   2. Refer  to Dr. Ether Griffins  Can try ice for now  3. rec healthy diet choices and exercise A1C 6.0 10/2016  4.  No vaccines current in chart consider flu, Tdap, ask about hep B vaccine  Can f/u with PCP to see if wants STD check in future   Colonoscopy 11/2015 diverticulosis, [facility] Dr. Servando Snare    Provider: Dr. French Ana McLean-Scocuzza-Internal Medicine

## 2017-03-11 NOTE — Progress Notes (Signed)
Pre visit review using our clinic review tool, if applicable. No additional management support is needed unless otherwise documented below in the visit note. 

## 2017-03-11 NOTE — Patient Instructions (Addendum)
Try Peptobismol for nausea and diarrhea over the counter  Try bland diet below  Try Zofran for nausea  F/u in 1-2 weeks if needed otherwise 04/2017 with PCP  You will need to see the foot doctor about your heal   Bland Diet (Broth, Rice, applesauce, toast, crackers, gingerale, gatorade)   A bland diet consists of foods that do not have a lot of fat or fiber. Foods without fat or fiber are easier for the body to digest. They are also less likely to irritate your mouth, throat, stomach, and other parts of your gastrointestinal tract. A bland diet is sometimes called a BRAT diet. What is my plan? Your health care provider or dietitian may recommend specific changes to your diet to prevent and treat your symptoms, such as:  Eating small meals often.  Cooking food until it is soft enough to chew easily.  Chewing your food well.  Drinking fluids slowly.  Not eating foods that are very spicy, sour, or fatty.  Not eating citrus fruits, such as oranges and grapefruit.  What do I need to know about this diet?  Eat a variety of foods from the bland diet food list.  Do not follow a bland diet longer than you have to.  Ask your health care provider whether you should take vitamins. What foods can I eat? Grains  Hot cereals, such as cream of wheat. Bread, crackers, or tortillas made from refined white flour. Rice. Vegetables Canned or cooked vegetables. Mashed or boiled potatoes. Fruits Bananas. Applesauce. Other types of cooked or canned fruit with the skin and seeds removed, such as canned peaches or pears. Meats and Other Protein Sources Scrambled eggs. Creamy peanut butter or other nut butters. Lean, well-cooked meats, such as chicken or fish. Tofu. Soups or broths. Dairy Low-fat dairy products, such as milk, cottage cheese, or yogurt. Beverages Water. Herbal tea. Apple juice. Sweets and Desserts Pudding. Custard. Fruit gelatin. Ice cream. Fats and Oils Mild salad dressings.  Canola or olive oil. The items listed above may not be a complete list of allowed foods or beverages. Contact your dietitian for more options. What foods are not recommended? Foods and ingredients that are often not recommended include:  Spicy foods, such as hot sauce or salsa.  Fried foods.  Sour foods, such as pickled or fermented foods.  Raw vegetables or fruits, especially citrus or berries.  Caffeinated drinks.  Alcohol.  Strongly flavored seasonings or condiments.  The items listed above may not be a complete list of foods and beverages that are not allowed. Contact your dietitian for more information. This information is not intended to replace advice given to you by your health care provider. Make sure you discuss any questions you have with your health care provider. Document Released: 05/09/2015 Document Revised: 06/23/2015 Document Reviewed: 01/27/2014 Elsevier Interactive Patient Education  2018 Elsevier Inc.   Plantar Fasciitis Plantar fasciitis is a painful foot condition that affects the heel. It occurs when the band of tissue that connects the toes to the heel bone (plantar fascia) becomes irritated. This can happen after exercising too much or doing other repetitive activities (overuse injury). The pain from plantar fasciitis can range from mild irritation to severe pain that makes it difficult for you to walk or move. The pain is usually worse in the morning or after you have been sitting or lying down for a while. What are the causes? This condition may be caused by:  Standing for long periods of time.  Wearing shoes that do not fit.  Doing high-impact activities, including running, aerobics, and ballet.  Being overweight.  Having an abnormal way of walking (gait).  Having tight calf muscles.  Having high arches in your feet.  Starting a new athletic activity.  What are the signs or symptoms? The main symptom of this condition is heel pain. Other  symptoms include:  Pain that gets worse after activity or exercise.  Pain that is worse in the morning or after resting.  Pain that goes away after you walk for a few minutes.  How is this diagnosed? This condition may be diagnosed based on your signs and symptoms. Your health care provider will also do a physical exam to check for:  A tender area on the bottom of your foot.  A high arch in your foot.  Pain when you move your foot.  Difficulty moving your foot.  You may also need to have imaging studies to confirm the diagnosis. These can include:  X-rays.  Ultrasound.  MRI.  How is this treated? Treatment for plantar fasciitis depends on the severity of the condition. Your treatment may include:  Rest, ice, and over-the-counter pain medicines to manage your pain.  Exercises to stretch your calves and your plantar fascia.  A splint that holds your foot in a stretched, upward position while you sleep (night splint).  Physical therapy to relieve symptoms and prevent problems in the future.  Cortisone injections to relieve severe pain.  Extracorporeal shock wave therapy (ESWT) to stimulate damaged plantar fascia with electrical impulses. It is often used as a last resort before surgery.  Surgery, if other treatments have not worked after 12 months.  Follow these instructions at home:  Take medicines only as directed by your health care provider.  Avoid activities that cause pain.  Roll the bottom of your foot over a bag of ice or a bottle of cold water. Do this for 20 minutes, 3-4 times a day.  Perform simple stretches as directed by your health care provider.  Try wearing athletic shoes with air-sole or gel-sole cushions or soft shoe inserts.  Wear a night splint while sleeping, if directed by your health care provider.  Keep all follow-up appointments with your health care provider. How is this prevented?  Do not perform exercises or activities that cause  heel pain.  Consider finding low-impact activities if you continue to have problems.  Lose weight if you need to. The best way to prevent plantar fasciitis is to avoid the activities that aggravate your plantar fascia. Contact a health care provider if:  Your symptoms do not go away after treatment with home care measures.  Your pain gets worse.  Your pain affects your ability to move or do your daily activities. This information is not intended to replace advice given to you by your health care provider. Make sure you discuss any questions you have with your health care provider. Document Released: 10/10/2000 Document Revised: 06/20/2015 Document Reviewed: 11/25/2013 Elsevier Interactive Patient Education  [employer].

## 2017-03-13 ENCOUNTER — Telehealth: Payer: Self-pay | Admitting: Family Medicine

## 2017-03-13 ENCOUNTER — Other Ambulatory Visit: Payer: BLUE CROSS/BLUE SHIELD

## 2017-03-13 DIAGNOSIS — R112 Nausea with vomiting, unspecified: Secondary | ICD-10-CM

## 2017-03-13 DIAGNOSIS — R197 Diarrhea, unspecified: Secondary | ICD-10-CM

## 2017-03-13 NOTE — Telephone Encounter (Signed)
PT saw Dr. Shirlee LatchMcLean on Monday, 03/11/2017. He is still having a problem with keeping food. Pt states it goes right through him. Please advise, pts phone  Number is [phone]

## 2017-03-13 NOTE — Telephone Encounter (Signed)
Did he try below  1. BRAT diet supportive care  peptobismol rec, prn Zofran, low sugar gatorade, ginger ale   Waiting on stool samples  If neg will tell him what else to do

## 2017-03-13 NOTE — Addendum Note (Signed)
Addended by: Penne LashWIGGINS, Ra Pfiester N on: 03/13/2017 09:00 AM   Modules accepted: Orders

## 2017-03-13 NOTE — Telephone Encounter (Signed)
Please advise 

## 2017-03-13 NOTE — Addendum Note (Signed)
Addended by: Warden FillersWRIGHT, Rondarius Kadrmas S on: 03/13/2017 04:55 PM   Modules accepted: Orders

## 2017-03-13 NOTE — Telephone Encounter (Signed)
Please advise, patient saw Dr.Mclean

## 2017-03-14 LAB — CBC WITH DIFFERENTIAL/PLATELET
BASOS ABS: 29 {cells}/uL (ref 0–200)
BASOS PCT: 0.5 %
Eosinophils Absolute: 58 cells/uL (ref 15–500)
Eosinophils Relative: 1 %
HCT: 41.9 % (ref 38.5–50.0)
Hemoglobin: 13.8 g/dL (ref 13.2–17.1)
LYMPHS ABS: 1891 {cells}/uL (ref 850–3900)
MCH: 27 pg (ref 27.0–33.0)
MCHC: 32.9 g/dL (ref 32.0–36.0)
MCV: 82 fL (ref 80.0–100.0)
MPV: 11.7 fL (ref 7.5–12.5)
Monocytes Relative: 9.4 %
NEUTROS ABS: 3277 {cells}/uL (ref 1500–7800)
Neutrophils Relative %: 56.5 %
PLATELETS: 247 10*3/uL (ref 140–400)
RBC: 5.11 10*6/uL (ref 4.20–5.80)
RDW: 13.3 % (ref 11.0–15.0)
TOTAL LYMPHOCYTE: 32.6 %
WBC mixed population: 545 cells/uL (ref 200–950)
WBC: 5.8 10*3/uL (ref 3.8–10.8)

## 2017-03-14 LAB — COMPLETE METABOLIC PANEL WITH GFR
AG Ratio: 1.3 (calc) (ref 1.0–2.5)
ALT: 23 U/L (ref 9–46)
AST: 21 U/L (ref 10–40)
Albumin: 3.9 g/dL (ref 3.6–5.1)
Alkaline phosphatase (APISO): 66 U/L (ref 40–115)
BUN: 12 mg/dL (ref 7–25)
CALCIUM: 9.3 mg/dL (ref 8.6–10.3)
CHLORIDE: 104 mmol/L (ref 98–110)
CO2: 25 mmol/L (ref 20–32)
Creat: 1.14 mg/dL (ref 0.60–1.35)
GFR, EST AFRICAN AMERICAN: 94 mL/min/{1.73_m2} (ref >=60)
GFR, EST NON AFRICAN AMERICAN: 81 mL/min/{1.73_m2} (ref >=60)
GLUCOSE: 150 mg/dL — AB (ref 65–99)
Globulin: 3.1 g/dL (calc) (ref 1.9–3.7)
POTASSIUM: 3.9 mmol/L (ref 3.5–5.3)
Sodium: 137 mmol/L (ref 135–146)
TOTAL PROTEIN: 7 g/dL (ref 6.1–8.1)
Total Bilirubin: 0.4 mg/dL (ref 0.2–1.2)

## 2017-03-14 LAB — TEST AUTHORIZATION

## 2017-03-14 LAB — HEPATITIS A ANTIBODY, TOTAL: Hepatitis A AB,Total: NONREACTIVE

## 2017-03-14 LAB — CLOSTRIDIUM DIFFICILE BY PCR: CDIFFPCR: NEGATIVE

## 2017-03-14 LAB — HEPATITIS A ANTIBODY, IGM: Hep A IgM: NONREACTIVE

## 2017-03-15 NOTE — Telephone Encounter (Signed)
Yes and he has been informed of results.

## 2017-03-18 LAB — STOOL CULTURE: E coli, Shiga toxin Assay: NEGATIVE

## 2017-03-27 ENCOUNTER — Ambulatory Visit: Payer: BLUE CROSS/BLUE SHIELD | Admitting: Family Medicine

## 2017-03-27 ENCOUNTER — Encounter: Payer: Self-pay | Admitting: *Deleted

## 2017-03-27 ENCOUNTER — Encounter: Payer: Self-pay | Admitting: Family Medicine

## 2017-03-27 VITALS — BP 122/72 | HR 69 | Temp 98.3°F | Wt 369.2 lb

## 2017-03-27 DIAGNOSIS — K529 Noninfective gastroenteritis and colitis, unspecified: Secondary | ICD-10-CM | POA: Diagnosis not present

## 2017-03-27 NOTE — Progress Notes (Signed)
Subjective:    Patient ID: Leonard Pierce, male    DOB: [birth year], 37 y.o.   MRN: [id number]  HPI This is a 37 yo male who presents today with nausea and vomiting. Yesterday began to feel "woozie" and had vomiting x 4. No vomiting today. No fever. Stomach hurting in middle today. Loose bowel movement today, no blood or mucus. No dysuria, no frequency. Took two doses of ondansetron with some relief. Stomach virus going around work.  Nearly 20 pound weight gain since 11/18. Stopped working out. Drinks soda, juice daily, eats out a lot.   Past Medical History:  Diagnosis Date  . Acute MI (HCC)    Reports MI at 19, seen at [facility]   . Coronary artery disease   . Diverticulitis 2017  . Gout    Past Surgical History:  Procedure Laterality Date  . COLONOSCOPY WITH PROPOFOL N/A 12/06/2015   Procedure: COLONOSCOPY WITH PROPOFOL;  Surgeon: Midge Minium, MD;  Location: [facility] ENDOSCOPY;  Service: Endoscopy;  Laterality: N/A;  . NO PAST SURGERIES     per patient 10/13/15  . TONSILLECTOMY     Family History  Problem Relation Age of Onset  . Non-Hodgkin's lymphoma Mother   . Hypertension Mother   . Diabetes Father   . Heart disease Maternal Grandfather   . Diabetes Maternal Grandfather   . Renal Disease Maternal Grandfather   . Diabetes Maternal Grandmother   . Heart disease Maternal Grandmother   . Diabetes Paternal Grandmother   . Heart disease Paternal Grandmother   . Renal Disease Paternal Grandmother   . Diabetes Paternal Grandfather   . Heart disease Paternal Grandfather 61  . Renal Disease Paternal Grandfather    Social History   Tobacco Use  . Smoking status: Never Smoker  . Smokeless tobacco: Never Used  Substance Use Topics  . Alcohol use: Yes    Alcohol/week: 1.2 oz    Types: 2 Cans of beer per week  . Drug use: No      Review of Systems Per HPI    Objective:   Physical Exam  Constitutional: He is oriented to person, place, and time. He appears well-developed and  well-nourished. No distress.  Morbidly obese.   HENT:  Head: Normocephalic and atraumatic.  Eyes: Conjunctivae are normal.  Cardiovascular: Normal rate, regular rhythm and normal heart sounds.  Pulmonary/Chest: Effort normal and breath sounds normal.  Abdominal: Soft. Bowel sounds are normal. He exhibits no distension. There is tenderness (mild periumbilical). There is no rebound and no guarding.  Neurological: He is alert and oriented to person, place, and time.  Skin: Skin is warm and dry. He is not diaphoretic.  Psychiatric: He has a normal mood and affect. His behavior is normal. Judgment and thought content normal.  Vitals reviewed.     BP 122/72   Pulse 69   Temp 98.3 F (36.8 C) (Oral)   Wt (!) 369 lb 4 oz (167.5 kg)   SpO2 97%   BMI 54.53 kg/m  Wt Readings from Last 3 Encounters:  03/27/17 (!) 369 lb 4 oz (167.5 kg)  03/11/17 (!) 365 lb 12.8 oz (165.9 kg)  11/30/16 (!) 350 lb (158.8 kg)       Assessment & Plan:  1. Gastroenteritis - presumed infectious with sick contacts at work - Provided written and verbal information regarding diagnosis and treatment. - encouraged him to advance diet slowly, discussed potential for constipation with ondansetron - RTC precautions reviewed  2. Morbid obesity (HCC) -  discussed recent weight gain, reviewed recent labs (had TSH, HgbA1C in fall) - encouraged him to eliminate soda, juice, sweet tea, reduce fast foods, fried foods and follow up with PCP   Olean Ree, FNP-BC  [facility], [facility]  03/27/2017 8:32 AM

## 2017-03-27 NOTE — Patient Instructions (Signed)
Be very careful about adding food  Stop drinking soda, juice and sweet tea.  Avoid fast food, fried foods. I have given you some information about Mediterranean Diet.    Heart-Healthy Eating Plan Heart-healthy meal planning includes:  Limiting unhealthy fats.  Increasing healthy fats.  Making other small dietary changes.  You may need to talk with your doctor or a diet specialist (dietitian) to create an eating plan that is right for you. What types of fat should I choose?  Choose healthy fats. These include olive oil and canola oil, flaxseeds, walnuts, almonds, and seeds.  Eat more omega-3 fats. These include salmon, mackerel, sardines, tuna, flaxseed oil, and ground flaxseeds. Try to eat fish at least twice each week.  Limit saturated fats. ? Saturated fats are often found in animal products, such as meats, butter, and cream. ? Plant sources of saturated fats include palm oil, palm kernel oil, and coconut oil.  Avoid foods with partially hydrogenated oils in them. These include stick margarine, some tub margarines, cookies, crackers, and other baked goods. These contain trans fats. What general guidelines do I need to follow?  Check food labels carefully. Identify foods with trans fats or high amounts of saturated fat.  Fill one half of your plate with vegetables and green salads. Eat 4-5 servings of vegetables per day. A serving of vegetables is: ? 1 cup of raw leafy vegetables. ?  cup of raw or cooked cut-up vegetables. ?  cup of vegetable juice.  Fill one fourth of your plate with whole grains. Look for the word "whole" as the first word in the ingredient list.  Fill one fourth of your plate with lean protein foods.  Eat 4-5 servings of fruit per day. A serving of fruit is: ? One medium whole fruit. ?  cup of dried fruit. ?  cup of fresh, frozen, or canned fruit. ?  cup of 100% fruit juice.  Eat more foods that contain soluble fiber. These include apples,  broccoli, carrots, beans, peas, and barley. Try to get 20-30 g of fiber per day.  Eat more home-cooked food. Eat less restaurant, buffet, and fast food.  Limit or avoid alcohol.  Limit foods high in starch and sugar.  Avoid fried foods.  Avoid frying your food. Try baking, boiling, grilling, or broiling it instead. You can also reduce fat by: ? Removing the skin from poultry. ? Removing all visible fats from meats. ? Skimming the fat off of stews, soups, and gravies before serving them. ? Steaming vegetables in water or broth.  Lose weight if you are overweight.  Eat 4-5 servings of nuts, legumes, and seeds per week: ? One serving of dried beans or legumes equals  cup after being cooked. ? One serving of nuts equals 1 ounces. ? One serving of seeds equals  ounce or one tablespoon.  You may need to keep track of how much salt or sodium you eat. This is especially true if you have high blood pressure. Talk with your doctor or dietitian to get more information. What foods can I eat? Grains Breads, including [place], white, pita, wheat, raisin, rye, oatmeal, and [place]. Tortillas that are neither fried nor made with lard or trans fat. Low-fat rolls, including hotdog and hamburger buns and English muffins. Biscuits. Muffins. Waffles. Pancakes. Light popcorn. Whole-grain cereals. Flatbread. Melba toast. Pretzels. Breadsticks. Rusks. Low-fat snacks. Low-fat crackers, including oyster, saltine, matzo, graham, animal, and rye. Rice and pasta, including brown rice and pastas that are made with  whole wheat. Vegetables All vegetables. Fruits All fruits, but limit coconut. Meats and Other Protein Sources Lean, well-trimmed beef, veal, pork, and lamb. Chicken and [place] without skin. All fish and shellfish. Wild duck, rabbit, pheasant, and venison. Egg whites or low-cholesterol egg substitutes. Dried beans, peas, lentils, and tofu. Seeds and most nuts. Dairy Low-fat or nonfat cheeses,  including ricotta, string, and mozzarella. Skim or 1% milk that is liquid, powdered, or evaporated. Buttermilk that is made with low-fat milk. Nonfat or low-fat yogurt. Beverages Mineral water. Diet carbonated beverages. Sweets and Desserts Sherbets and fruit ices. Honey, jam, marmalade, jelly, and syrups. Meringues and gelatins. Pure sugar candy, such as hard candy, jelly beans, gumdrops, mints, marshmallows, and small amounts of dark chocolate. [employer]. Eat all sweets and desserts in moderation. Fats and Oils Nonhydrogenated (trans-free) margarines. Vegetable oils, including soybean, sesame, sunflower, olive, peanut, safflower, corn, canola, and cottonseed. Salad dressings or mayonnaise made with a vegetable oil. Limit added fats and oils that you use for cooking, baking, salads, and as spreads. Other Cocoa powder. Coffee and tea. All seasonings and condiments. The items listed above may not be a complete list of recommended foods or beverages. Contact your dietitian for more options. What foods are not recommended? Grains Breads that are made with saturated or trans fats, oils, or whole milk. Croissants. Butter rolls. Cheese breads. Sweet rolls. Donuts. Buttered popcorn. Chow mein noodles. High-fat crackers, such as cheese or butter crackers. Meats and Other Protein Sources Fatty meats, such as hotdogs, short ribs, sausage, spareribs, bacon, rib eye roast or steak, and mutton. High-fat deli meats, such as salami and bologna. Caviar. Domestic duck and goose. Organ meats, such as kidney, liver, sweetbreads, and heart. Dairy Cream, sour cream, cream cheese, and creamed cottage cheese. Whole-milk cheeses, including blue (bleu), [address], [place], [place], [address], [place], [address], cheddar, [place], and [place]. Whole or 2% milk that is liquid, evaporated, or condensed. Whole buttermilk. Cream sauce or high-fat cheese sauce. Yogurt that is made from whole milk. Beverages Regular sodas and  juice drinks with added sugar. Sweets and Desserts Frosting. Pudding. Cookies. Cakes other than angel food cake. Candy that has milk chocolate or white chocolate, hydrogenated fat, butter, coconut, or unknown ingredients. Buttered syrups. Full-fat ice cream or ice cream drinks. Fats and Oils Gravy that has suet, meat fat, or shortening. Cocoa butter, hydrogenated oils, palm oil, coconut oil, palm kernel oil. These can often be found in baked products, candy, fried foods, nondairy creamers, and whipped toppings. Solid fats and shortenings, including bacon fat, salt pork, lard, and butter. Nondairy cream substitutes, such as coffee creamers and sour cream substitutes. Salad dressings that are made of unknown oils, cheese, or sour cream. The items listed above may not be a complete list of foods and beverages to avoid. Contact your dietitian for more information. This information is not intended to replace advice given to you by your health care provider. Make sure you discuss any questions you have with your health care provider. Document Released: 07/17/2011 Document Revised: 06/23/2015 Document Reviewed: 07/09/2013 Elsevier Interactive Patient Education  2018 [employer].     [place] Diet A bland diet consists of foods that do not have a lot of fat or fiber. Foods without fat or fiber are easier for the body to digest. They are also less likely to irritate your mouth, throat, stomach, and other parts of your gastrointestinal tract. A bland diet is sometimes called a BRAT diet. What is my plan? Your health care  provider or dietitian may recommend specific changes to your diet to prevent and treat your symptoms, such as:  Eating small meals often.  Cooking food until it is soft enough to chew easily.  Chewing your food well.  Drinking fluids slowly.  Not eating foods that are very spicy, sour, or fatty.  Not eating citrus fruits, such as oranges and grapefruit.  What do I need to know  about this diet?  Eat a variety of foods from the bland diet food list.  Do not follow a bland diet longer than you have to.  Ask your health care provider whether you should take vitamins. What foods can I eat? Grains  Hot cereals, such as cream of wheat. Bread, crackers, or tortillas made from refined white flour. Rice. Vegetables Canned or cooked vegetables. Mashed or boiled potatoes. Fruits Bananas. Applesauce. Other types of cooked or canned fruit with the skin and seeds removed, such as canned peaches or pears. Meats and Other Protein Sources Scrambled eggs. Creamy peanut butter or other nut butters. Lean, well-cooked meats, such as chicken or fish. Tofu. Soups or broths. Dairy Low-fat dairy products, such as milk, cottage cheese, or yogurt. Beverages Water. Herbal tea. Apple juice. Sweets and Desserts Pudding. Custard. Fruit gelatin. Ice cream. Fats and Oils Mild salad dressings. Canola or olive oil. The items listed above may not be a complete list of allowed foods or beverages. Contact your dietitian for more options. What foods are not recommended? Foods and ingredients that are often not recommended include:  Spicy foods, such as hot sauce or salsa.  Fried foods.  Sour foods, such as pickled or fermented foods.  Raw vegetables or fruits, especially citrus or berries.  Caffeinated drinks.  Alcohol.  Strongly flavored seasonings or condiments.  The items listed above may not be a complete list of foods and beverages that are not allowed. Contact your dietitian for more information. This information is not intended to replace advice given to you by your health care provider. Make sure you discuss any questions you have with your health care provider. Document Released: 05/09/2015 Document Revised: 06/23/2015 Document Reviewed: 01/27/2014 Elsevier Interactive Patient Education  2018 [employer]

## 2017-05-30 ENCOUNTER — Ambulatory Visit: Payer: 59 | Admitting: Podiatry

## 2017-06-27 ENCOUNTER — Other Ambulatory Visit: Payer: Self-pay

## 2017-06-27 ENCOUNTER — Emergency Department
Admission: EM | Admit: 2017-06-27 | Discharge: 2017-06-27 | Disposition: A | Discharge status: Home / Self Care | Payer: 59 | Attending: Emergency Medicine | Admitting: Emergency Medicine

## 2017-06-27 ENCOUNTER — Emergency Department: Payer: 59

## 2017-06-27 DIAGNOSIS — R0789 Other chest pain: Secondary | ICD-10-CM

## 2017-06-27 DIAGNOSIS — Z79899 Other long term (current) drug therapy: Secondary | ICD-10-CM | POA: Diagnosis not present

## 2017-06-27 DIAGNOSIS — I2510 Atherosclerotic heart disease of native coronary artery without angina pectoris: Secondary | ICD-10-CM | POA: Insufficient documentation

## 2017-06-27 LAB — TROPONIN I: Troponin I: <0.03 ng/mL (ref <0.03)

## 2017-06-27 LAB — BASIC METABOLIC PANEL
Anion gap: 8 (ref 5–15)
BUN: 14 mg/dL (ref 6–20)
CALCIUM: 8.9 mg/dL (ref 8.9–10.3)
CO2: 24 mmol/L (ref 22–32)
CREATININE: 1.05 mg/dL (ref 0.61–1.24)
Chloride: 105 mmol/L (ref 101–111)
GFR calc Af Amer: >60 mL/min (ref >60)
GLUCOSE: 115 mg/dL — AB (ref 65–99)
Potassium: 3.7 mmol/L (ref 3.5–5.1)
Sodium: 137 mmol/L (ref 135–145)

## 2017-06-27 LAB — CBC WITH DIFFERENTIAL/PLATELET
BASOS ABS: 0 10*3/uL (ref 0–0.1)
BASOS PCT: 1 %
EOS PCT: 1 %
Eosinophils Absolute: 0.1 10*3/uL (ref 0–0.7)
HCT: 40 % (ref 40.0–52.0)
Hemoglobin: 13.3 g/dL (ref 13.0–18.0)
Lymphocytes Relative: 30 %
Lymphs Abs: 1.5 10*3/uL (ref 1.0–3.6)
MCH: 28.1 pg (ref 26.0–34.0)
MCHC: 33.3 g/dL (ref 32.0–36.0)
MCV: 84.3 fL (ref 80.0–100.0)
MONO ABS: 0.7 10*3/uL (ref 0.2–1.0)
Monocytes Relative: 14 %
Neutro Abs: 2.7 10*3/uL (ref 1.4–6.5)
Neutrophils Relative %: 54 %
PLATELETS: 198 10*3/uL (ref 150–440)
RBC: 4.74 MIL/uL (ref 4.40–5.90)
RDW: 14.4 % (ref 11.5–14.5)
WBC: 5 10*3/uL (ref 3.8–10.6)

## 2017-06-27 MED ORDER — GI COCKTAIL ~~LOC~~
30.0000 mL | ORAL | Status: AC
  Administered 2017-06-27: 30 mL via ORAL

## 2017-06-27 MED ORDER — FAMOTIDINE 20 MG PO TABS
40.0000 mg | ORAL_TABLET | Freq: Once | ORAL | Status: AC
  Administered 2017-06-27: 40 mg via ORAL
  Filled 2017-06-27: qty 2

## 2017-06-27 NOTE — Discharge Instructions (Addendum)
your EKG, chest x-ray, and blood tests today were all unremarkable. Follow-up with your cardiologist for further evaluation of today's symptoms.

## 2017-06-27 NOTE — ED Provider Notes (Signed)
[facility] Emergency Department Provider Note  ____________________________________________  Time seen: Approximately 12:01 PM  I have reviewed the triage vital signs and the nursing notes.   HISTORY  Chief Complaint Chest Pain    HPI Leonard Pierce is a 37 y.o. 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 Moderate intensity, described as sharp, radiating to left shoulder, no shortness of breath diaphoresis or vomiting. Not specifically exertional. Not pleuritic. No aggravating or alleviating factors. Constant and waxing and waning since 7:00.      Past Medical History:  Diagnosis Date  . Acute MI (HCC)    Reports MI at 19, seen at [facility]   . Coronary artery disease   . Diverticulitis 2017  . Gout      Patient Active Problem List   Diagnosis Date Noted  . Prediabetes 03/11/2017  . Gastroenteritis 12/02/2016  . Calcific Achilles tendinitis 11/27/2016  . Morbid obesity (HCC) 11/19/2016  . Elevated BP without diagnosis of hypertension 11/19/2016  . Peroneal tendinitis 11/19/2016  . Diverticulitis of colon      Past Surgical History:  Procedure Laterality Date  . COLONOSCOPY WITH PROPOFOL N/A 12/06/2015   Procedure: COLONOSCOPY WITH PROPOFOL;  Surgeon: Midge Minium, MD;  Location: [facility] ENDOSCOPY;  Service: Endoscopy;  Laterality: N/A;  . NO PAST SURGERIES     per patient 10/13/15  . TONSILLECTOMY       Prior to Admission medications   Medication Sig Start Date End Date Taking? Authorizing Provider  meloxicam (MOBIC) 15 MG tablet Take 1 tablet (15 mg total) by mouth daily. 11/27/16   Glori Luis, MD  ondansetron (ZOFRAN) 4 MG tablet Take 1 tablet (4 mg total) by mouth every 8 (eight) hours as needed for nausea or vomiting. 03/11/17   McLean-Scocuzza, Pasty Spillers, MD  traMADol  (ULTRAM) 50 MG tablet Take 1 tablet (50 mg total) by mouth every 6 (six) hours as needed. 05/29/16   Rebecka Apley, MD     Allergies Patient has no known allergies.   Family History  Problem Relation Age of Onset  . Non-Hodgkin's lymphoma Mother   . Hypertension Mother   . Diabetes Father   . Heart disease Maternal Grandfather   . Diabetes Maternal Grandfather   . Renal Disease Maternal Grandfather   . Diabetes Maternal Grandmother   . Heart disease Maternal Grandmother   . Diabetes Paternal Grandmother   . Heart disease Paternal Grandmother   . Renal Disease Paternal Grandmother   . Diabetes Paternal Grandfather   . Heart disease Paternal Grandfather 39  . Renal Disease Paternal Grandfather     Social History Social History   Tobacco Use  . Smoking status: Never Smoker  . Smokeless tobacco: Never Used  Substance Use Topics  . Alcohol use: Yes    Alcohol/week: 1.2 oz    Types: 2 Cans of beer per week  . Drug use: No    Review of Systems  Constitutional:   No fever or chills.  ENT:   No sore throat. No rhinorrhea. Cardiovascular:   positive as above chest pain without syncope. Respiratory:   No dyspnea or cough. Gastrointestinal:   Negative for abdominal pain, vomiting and diarrhea.  Musculoskeletal:   Negative for focal pain or swelling All other systems reviewed and are negative except as documented  above in ROS and HPI.  ____________________________________________   PHYSICAL EXAM:  VITAL SIGNS: ED Triage Vitals  Enc Vitals Group     BP 06/27/17 0754 115/89     Pulse Rate 06/27/17 0754 63     Resp 06/27/17 0754 17     Temp 06/27/17 0754 97.6 F (36.4 C)     Temp Source 06/27/17 0754 Oral     SpO2 06/27/17 0749 99 %     Weight 06/27/17 0754 (!) 335 lb (152 kg)     Height 06/27/17 0754  (1.727 m)     Head Circumference --      Peak Flow --      Pain Score 06/27/17 0754 6     Pain Loc --      Pain Edu? --      Excl. in GC? --     Vital  signs reviewed, nursing assessments reviewed.   Constitutional:   Alert and oriented. Well appearing and in no distress. Eyes:   Conjunctivae are normal. EOMI. PERRL. ENT      Head:   Normocephalic and atraumatic.      Nose:   No congestion/rhinnorhea.       Mouth/Throat:   MMM, no pharyngeal erythema. No peritonsillar mass.       Neck:   No meningismus. Full ROM. Hematological/Lymphatic/Immunilogical:   No cervical lymphadenopathy. Cardiovascular:   RRR. Symmetric bilateral radial and DP pulses.  No murmurs.  Respiratory:   Normal respiratory effort without tachypnea/retractions. Breath sounds are clear and equal bilaterally. No wheezes/rales/rhonchi. Gastrointestinal:   Soft and nontender. Non distended. There is no CVA tenderness.  No rebound, rigidity, or guarding.  Musculoskeletal:   Normal range of motion in all extremities. No joint effusions.  No lower extremity tenderness.  No edema. Neurologic:   Normal speech and language.  Motor grossly intact. No acute focal neurologic deficits are appreciated.  Skin:    Skin is warm, dry and intact. No rash noted.  No petechiae, purpura, or bullae.  ____________________________________________    LABS (pertinent positives/negatives) (all labs ordered are listed, but only abnormal results are displayed) Labs Reviewed  BASIC METABOLIC PANEL - Abnormal; Notable for the following components:      Result Value   Glucose, Bld 115 (*)    All other components within normal limits  CBC WITH DIFFERENTIAL/PLATELET  TROPONIN I  TROPONIN I   ____________________________________________   EKG  interpreted by me Normal sinus rhythm rate of 58, normal axis intervals QRS ST segments and T waves.slight J-point elevation in V2 and V3, unchanged from 12/14/2014.  ____________________________________________    RADIOLOGY  Dg Chest 2 View  Result Date: 06/27/2017 CLINICAL DATA:  Chest pain EXAM: CHEST - 2 VIEW COMPARISON:  October 04, 2015  FINDINGS: Lungs are clear. Heart size and pulmonary vascularity are normal. No adenopathy. No pneumothorax. No evident bone lesions. IMPRESSION: No edema or consolidation. Electronically Signed   By: Bretta Bang III M.D.   On: 06/27/2017 08:33    ____________________________________________   PROCEDURES Procedures  ____________________________________________    CLINICAL IMPRESSION / ASSESSMENT AND PLAN / ED COURSE  Pertinent labs & imaging results that were available during my care of the patient were reviewed by me and considered in my medical decision making (see chart for details).    patient well-appearing no acute distress, unremarkable vital signs, presents with atypical chest pain. Due to reported history of heart attack and obesity, for any cardiac workup. Low suspicion for unstable  angina, PE dissection AAA pneumothorax pericarditis or pneumonia. Patient is not septic and not in distress.  Clinical Course as of Jun 28 1199  Thu Jun 27, 2017  1610 Care everywhere reviewed for prior deep results. Patient has had several cardiac workups in the [facility] system, with normal treadmill stress long ago at the time of reported first heart attack, and most recently in 2013 a normal cardiac MRI without evidence of inducible ischemia or scarring.  without any actual documentation of prior MI or known CAD, patient's risk profile is dramatically reduced as he does not have hypertension diabetes or cholesterol. Nonsmoker. Only risk factor is obesity. his pain is nonexertional and atypical, and I think 2 troponins to rule out MI is sufficient. I highly doubt unstable angina.   [PS]    Clinical Course User Index [PS] Sharman Cheek, MD     ----------------------------------------- 12:04 PM on 06/27/2017 -----------------------------------------  Second troponin negative. Patient reports pain is resolved and is eager to be discharged. I'll recommend follow-up with his primary care  and cardiology.  ____________________________________________   FINAL CLINICAL IMPRESSION(S) / ED DIAGNOSES    Final diagnoses:  Atypical chest pain     ED Discharge Orders    None      Portions of this note were generated with dragon dictation software. Dictation errors may occur despite best attempts at proofreading.    Sharman Cheek, MD 06/27/17 1204

## 2017-06-27 NOTE — ED Notes (Signed)
Pt discharged home after verbalizing understanding of discharge instructions; nad noted. 

## 2017-06-27 NOTE — ED Triage Notes (Signed)
He arrives today via [facility] from work  - pt reporting mid sternal CP that radiates to the left since 0700  6/10 pain reported upon arrival   Hx MI at age 37, diverticulitis

## 2017-06-28 ENCOUNTER — Telehealth: Payer: Self-pay

## 2017-06-28 NOTE — Telephone Encounter (Signed)
Copied from CRM [id number]. Topic: General - Other >> Jun 28, 2017 11:10 AM Elliot Gault wrote:  Relation to pt: self  Call back number: [phone] Pharmacy:  Reason for call:  Patient was seen at [facility] 06/27/17 and would like a ED follow up on Monday 07/01/17 in order to return back to work, please advise >> Jun 28, 2017 11:13 AM Elliot Gault wrote:  Relation to pt: self  Call back number: [phone] Pharmacy:  Reason for call:  Patient was seen at [facility] 06/27/17 and would like a ED follow up on Monday 07/01/17 in order to return back to work, please advise  Patient scheduled to see Dr.Sonnenberg on  07-01-17

## 2017-07-01 ENCOUNTER — Encounter: Payer: Self-pay | Admitting: Family Medicine

## 2017-07-01 ENCOUNTER — Ambulatory Visit (INDEPENDENT_AMBULATORY_CARE_PROVIDER_SITE_OTHER): Payer: 59 | Admitting: Family Medicine

## 2017-07-01 VITALS — BP 140/82 | HR 81 | Temp 97.9°F | Wt 364.6 lb

## 2017-07-01 DIAGNOSIS — R7303 Prediabetes: Secondary | ICD-10-CM

## 2017-07-01 DIAGNOSIS — R079 Chest pain, unspecified: Secondary | ICD-10-CM

## 2017-07-01 DIAGNOSIS — R7309 Other abnormal glucose: Secondary | ICD-10-CM

## 2017-07-01 LAB — TROPONIN I: TNIDX: 0 ug/L (ref 0.00–0.06)

## 2017-07-01 LAB — BASIC METABOLIC PANEL
BUN: 11 mg/dL (ref 6–23)
CHLORIDE: 104 meq/L (ref 96–112)
CO2: 28 meq/L (ref 19–32)
Calcium: 9.4 mg/dL (ref 8.4–10.5)
Creatinine, Ser: 1 mg/dL (ref 0.40–1.50)
GFR: 106.93 mL/min (ref >60.00)
GLUCOSE: 131 mg/dL — AB (ref 70–99)
POTASSIUM: 4 meq/L (ref 3.5–5.1)
Sodium: 139 mEq/L (ref 135–145)

## 2017-07-01 LAB — HEMOGLOBIN A1C: Hgb A1c MFr Bld: 6.2 % (ref 4.6–6.5)

## 2017-07-01 NOTE — Patient Instructions (Signed)
Nice to see you. We will get lab work today and contact you with the results. If you develop worsening pain or you develop shortness of breath or any new or changing symptoms please seek medical attention immediately.

## 2017-07-01 NOTE — Progress Notes (Signed)
Leonard AlarEric Anaira Seay, MD Phone: [phone]  Leonard Pierce is a 37 y.o. male who presents today for f/u.  CC: ED f/u for chest pain  Patient notes he was at work when he developed some dizziness and lightheadedness and subsequently developed left-sided chest pain.  He describes it as somebody stepping on his chest.  He had some shortness of breath and diaphoresis with this.  He was evaluated by EMS and had an EKG which he states reported some elevation to it though on review of EKG from the ED it appears there is some J-point elevation that is similar to prior.  He notes he was evaluated in the emergency room.  Lab work with 2 negative troponins completed.  Chest x-ray unremarkable.  He was then discharged.  He notes off and on over the weekend he continued to have some issues with this. Last chest pain was the night prior to this visit. The only thing that brings it on is if he stretches his chest.  Nothing relieves it.  It goes away on its own with no intervention.  It can during exertion or at rest and is not relieved by rest.  He has had no unilateral leg swelling.  He had a slight imprints from his socks at one-point though no significant swelling.  He reports a history of an MI at age [age].  It appears based on review of his [facility] records from his hospitalization in 2002 that he had a syncopal episode likely related to dehydration.  He had a stress test with Cardiolite images that showed no evidence of ischemia and was deemed normal.  He did have ST segment depression during the treadmill test.  It was felt that he had noncardiac chest pain.  They felt that his EKG abnormalities at that time were consistent with repolarization abnormalities and there is no sign of abnormal conduction. He notes no reflux symptoms.  ED note reviewed.  Social History   Tobacco Use  Smoking Status Never Smoker  Smokeless Tobacco Never Used     ROS see history of present illness  Objective  Physical Exam Vitals:    07/01/17 1039  BP: 140/82  Pulse: 81  Temp: 97.9 F (36.6 C)  SpO2: 98%   Laying blood pressure 150/82 pulse 82 Sitting blood pressure 144/82 pulse 74 Standing blood pressure 142/88 pulse 84  BP Readings from Last 3 Encounters:  07/01/17 140/82  06/27/17 (!) 154/94  03/27/17 122/72   Wt Readings from Last 3 Encounters:  07/01/17 (!) 364 lb 9.6 oz (165.4 kg)  06/27/17 (!) 335 lb (152 kg)  03/27/17 (!) 369 lb 4 oz (167.5 kg)    Physical Exam  Constitutional: No distress.  Cardiovascular: Normal rate, regular rhythm and normal heart sounds.  Pulmonary/Chest: Effort normal and breath sounds normal. He exhibits tenderness (over his left chest, particularly left costocondral area and left medial pectoralis muscle).  Musculoskeletal: He exhibits no edema.  Neurological: He is alert.  Skin: Skin is warm and dry. He is not diaphoretic.   EKG: Normal sinus rhythm, rate 75, J-point elevation in V1 through V3, nonspecific T wave abnormalities  Assessment/Plan: Please see individual problem list.  Chest pain She with chest pain with atypical and typical features.  Typical and that it occurs in the left chest and is associated with shortness of breath.  Atypical in that he has muscular tenderness and it can occur at rest and with exertion though does not improve with rest.  I suspect this  is musculoskeletal given his history and that he likely strained a muscle while working.  His EKG is overall reassuring and fairly similar to prior EKGs.  We will check a troponin to rule out ischemic injury.  Will refer to cardiology for evaluation and he will see Dr. Juliann Pares on 07/02/2017.  He is given return precautions.  Prediabetes Glucose elevated on labs from ED.  We will check an A1c and repeat BMP.   Orders Placed This Encounter  Procedures  . Basic Metabolic Panel (BMET)  . HgB A1c  . Troponin I  . Ambulatory referral to Cardiology    Referral Priority:   Routine    Referral Type:    Consultation    Referral Reason:   Specialty Services Required    Requested Specialty:   Cardiology    Number of Visits Requested:   1  . EKG 12-Lead    No orders of the defined types were placed in this encounter.    Leonard Alar, MD [facility]

## 2017-07-02 DIAGNOSIS — R079 Chest pain, unspecified: Secondary | ICD-10-CM | POA: Insufficient documentation

## 2017-07-02 DIAGNOSIS — R7309 Other abnormal glucose: Secondary | ICD-10-CM | POA: Insufficient documentation

## 2017-07-02 NOTE — Assessment & Plan Note (Signed)
Glucose elevated on labs from ED.  We will check an A1c and repeat BMP.

## 2017-07-02 NOTE — Assessment & Plan Note (Signed)
She with chest pain with atypical and typical features.  Typical and that it occurs in the left chest and is associated with shortness of breath.  Atypical in that he has muscular tenderness and it can occur at rest and with exertion though does not improve with rest.  I suspect this is musculoskeletal given his history and that he likely strained a muscle while working.  His EKG is overall reassuring and fairly similar to prior EKGs.  We will check a troponin to rule out ischemic injury.  Will refer to cardiology for evaluation and he will see Dr. Juliann Paresallwood on 07/02/2017.  He is given return precautions.

## 2017-07-02 NOTE — Assessment & Plan Note (Signed)
>>  ASSESSMENT AND PLAN FOR PREDIABETES WRITTEN ON 07/02/2017 11:14 AM BY SONNENBERG, ERIC G, MD  Glucose elevated on labs from ED.  We will check an A1c and repeat BMP.

## 2017-07-03 ENCOUNTER — Other Ambulatory Visit: Payer: Self-pay | Admitting: Internal Medicine

## 2017-07-03 DIAGNOSIS — R0602 Shortness of breath: Secondary | ICD-10-CM

## 2017-07-03 DIAGNOSIS — I208 Other forms of angina pectoris: Secondary | ICD-10-CM

## 2017-07-03 DIAGNOSIS — I2089 Other forms of angina pectoris: Secondary | ICD-10-CM

## 2017-07-10 ENCOUNTER — Ambulatory Visit
Admission: RE | Admit: 2017-07-10 | Discharge: 2017-07-10 | Disposition: A | Discharge status: Home / Self Care | Payer: 59 | Source: Ambulatory Visit | Attending: Internal Medicine | Admitting: Internal Medicine

## 2017-07-10 DIAGNOSIS — I208 Other forms of angina pectoris: Secondary | ICD-10-CM | POA: Diagnosis present

## 2017-07-10 DIAGNOSIS — R0602 Shortness of breath: Secondary | ICD-10-CM

## 2017-07-10 MED ORDER — TECHNETIUM TC 99M TETROFOSMIN IV KIT: 30.0000 | PACK | Freq: Once | INTRAVENOUS | Status: DC | PRN

## 2017-07-11 ENCOUNTER — Ambulatory Visit
Admission: RE | Admit: 2017-07-11 | Discharge: 2017-07-11 | Disposition: A | Discharge status: Home / Self Care | Payer: 59 | Source: Ambulatory Visit | Attending: Internal Medicine | Admitting: Internal Medicine

## 2017-07-11 MED ORDER — TECHNETIUM TC 99M TETROFOSMIN IV KIT
32.9660 | PACK | Freq: Once | INTRAVENOUS | Status: AC | PRN
  Administered 2017-07-10: 32.966 via INTRAVENOUS

## 2017-07-11 MED ORDER — REGADENOSON 0.4 MG/5ML IV SOLN
0.4000 mg | Freq: Once | INTRAVENOUS | Status: AC
  Administered 2017-07-10: 0.4 mg via INTRAVENOUS
  Filled 2017-07-11: qty 5

## 2017-07-11 MED ORDER — TECHNETIUM TC 99M TETROFOSMIN IV KIT
32.0070 | PACK | Freq: Once | INTRAVENOUS | Status: AC | PRN
  Administered 2017-07-11: 32.007 via INTRAVENOUS

## 2017-07-15 LAB — NM MYOCAR MULTI W/SPECT W/WALL MOTION / EF
CHL CUP MPHR: 181 {beats}/min
CHL CUP NUCLEAR SSS: 1
CHL CUP RESTING HR STRESS: 60 {beats}/min
CSEPED: 1 min
CSEPEDS: 0 s
Estimated workload: 1 METS
LV sys vol: 32 mL
LVDIAVOL: 87 mL (ref 62–150)
NUC STRESS TID: 0.73
Peak HR: 105 {beats}/min
Percent HR: 58 %
SDS: 0
SRS: 0

## 2017-07-18 ENCOUNTER — Ambulatory Visit: Payer: 59 | Admitting: Cardiovascular Disease

## 2017-07-18 ENCOUNTER — Telehealth: Payer: Self-pay

## 2017-07-18 NOTE — Telephone Encounter (Signed)
Copied from CRM [id number]. Topic: Inquiry >> Jul 18, 2017  3:40 PM Terisa Starraylor, Brittany L wrote: Reason for CRM: Patient states he was in the office on 6/3, he saw Dr Birdie SonsSonnenberg. He would like to get a work note for that day. Please call patient when ready for pick up. [phone]

## 2017-07-18 NOTE — Telephone Encounter (Signed)
Please create a work note and I can sign it with the patient.

## 2017-07-18 NOTE — Telephone Encounter (Signed)
Please advise 

## 2017-07-19 NOTE — Telephone Encounter (Signed)
Placed at front desk patient notified

## 2017-07-19 NOTE — Telephone Encounter (Signed)
Placed on your desk. 

## 2017-07-19 NOTE — Telephone Encounter (Signed)
Signed.

## 2017-08-27 ENCOUNTER — Ambulatory Visit: Payer: 59 | Admitting: Cardiovascular Disease

## 2017-10-02 ENCOUNTER — Ambulatory Visit: Payer: 59 | Admitting: Family Medicine

## 2017-10-02 DIAGNOSIS — Z0289 Encounter for other administrative examinations: Secondary | ICD-10-CM

## 2017-12-19 ENCOUNTER — Telehealth: Payer: Self-pay

## 2017-12-19 NOTE — Telephone Encounter (Signed)
Copied from CRM [id number]. Topic: General - Other >> Dec 19, 2017  2:08 PM Gaynelle AduPoole, Shalonda wrote: Reason for CRM: patient is calling to request a call back in regards to now having insurances and needing a medicine to help with sleep. He stated a had a sleep study completed the first of year, the patient stated if he doesn't answer a detail message can be left

## 2017-12-19 NOTE — Telephone Encounter (Signed)
Please call the patient back and get more details. I can not see where he had a sleep study previously. He should also be seen in the office for his sleep given that he has not been seen in several months.

## 2017-12-20 NOTE — Telephone Encounter (Signed)
Spoke with patient he states that sleep study was ordered by another MD  in [place]. I don't see any available appointments to schedule patient in except acute appointments . Please advise. thanks

## 2017-12-20 NOTE — Telephone Encounter (Signed)
Patient scheduled 01/01/18 @4 :30 on Dr Purvis SheffieldSonnenberg's schedule . Can you place on schedule . Thanks

## 2017-12-20 NOTE — Telephone Encounter (Signed)
He can be scheduled in a 430 slot on a Monday or Wednesday.  Or on a Tuesday when I am in the office.

## 2017-12-20 NOTE — Telephone Encounter (Signed)
A patient has been scheduled in that slot.

## 2017-12-25 NOTE — Telephone Encounter (Signed)
Patient calling back about a work in for c-pap evaluation.

## 2018-04-08 ENCOUNTER — Ambulatory Visit (INDEPENDENT_AMBULATORY_CARE_PROVIDER_SITE_OTHER): Payer: 59 | Admitting: Family Medicine

## 2018-04-08 ENCOUNTER — Encounter: Payer: Self-pay | Admitting: Family Medicine

## 2018-04-08 VITALS — BP 122/64 | HR 88 | Temp 99.6°F | Resp 18 | Wt 376.5 lb

## 2018-04-08 DIAGNOSIS — J069 Acute upper respiratory infection, unspecified: Secondary | ICD-10-CM

## 2018-04-08 DIAGNOSIS — B9789 Other viral agents as the cause of diseases classified elsewhere: Secondary | ICD-10-CM | POA: Diagnosis not present

## 2018-04-08 DIAGNOSIS — J101 Influenza due to other identified influenza virus with other respiratory manifestations: Secondary | ICD-10-CM | POA: Diagnosis not present

## 2018-04-08 DIAGNOSIS — R52 Pain, unspecified: Secondary | ICD-10-CM

## 2018-04-08 LAB — POCT INFLUENZA A/B
INFLUENZA A, POC: POSITIVE — AB
Influenza B, POC: NEGATIVE

## 2018-04-08 MED ORDER — OSELTAMIVIR PHOSPHATE 75 MG PO CAPS: 75.0000 mg | ORAL_CAPSULE | Freq: Two times a day (BID) | ORAL | 0 refills | Status: AC

## 2018-04-08 NOTE — Progress Notes (Signed)
Subjective:     Leonard Pierce is a 38 y.o. male presenting for Generalized Body Aches (cough started on 04/06/2018 and felt worse yesterday. Chills, body aches, runny nose, sore throat, headache. No fever. Hastaking Theraflu.)     URI   This is a new problem. The current episode started in the past 7 days. The problem has been gradually worsening. There has been no fever. Associated symptoms include chest pain, congestion, coughing, headaches, rhinorrhea, sneezing and a sore throat. Pertinent negatives include no abdominal pain, ear pain, nausea, plugged ear sensation, sinus pain or vomiting. He has tried decongestant for the symptoms. The treatment provided mild relief.     Review of Systems  Constitutional: Positive for chills. Negative for fever.  HENT: Positive for congestion, rhinorrhea, sneezing and sore throat. Negative for ear pain and sinus pain.   Respiratory: Positive for cough.   Cardiovascular: Positive for chest pain.  Gastrointestinal: Negative for abdominal pain, nausea and vomiting.  Musculoskeletal: Positive for arthralgias and myalgias.  Neurological: Positive for headaches.     Social History   Tobacco Use  Smoking Status Never Smoker  Smokeless Tobacco Never Used        Objective:    BP Readings from Last 3 Encounters:  04/08/18 122/64  07/01/17 140/82  06/27/17 (!) 154/94   Wt Readings from Last 3 Encounters:  04/08/18 (!) 376 lb 8 oz (170.8 kg)  07/01/17 (!) 364 lb 9.6 oz (165.4 kg)  06/27/17 (!) 335 lb (152 kg)    BP 122/64   Pulse 88   Temp 99.6 F (37.6 C)   Resp 18   Wt (!) 376 lb 8 oz (170.8 kg)   SpO2 97%   BMI 57.25 kg/m    Physical Exam Constitutional:      General: He is not in acute distress.    Appearance: He is well-developed. He is obese. He is not ill-appearing.  HENT:     Head: Normocephalic and atraumatic.     Right Ear: Tympanic membrane and ear canal normal.     Left Ear: Tympanic membrane and ear canal normal.       Nose: Mucosal edema, congestion and rhinorrhea present.     Right Sinus: No maxillary sinus tenderness or frontal sinus tenderness.     Left Sinus: No maxillary sinus tenderness or frontal sinus tenderness.     Mouth/Throat:     Pharynx: Uvula midline. Posterior oropharyngeal erythema present. No oropharyngeal exudate.     Tonsils: Swelling: 0 on the right. 0 on the left.  Neck:     Musculoskeletal: Neck supple.  Cardiovascular:     Rate and Rhythm: Normal rate and regular rhythm.     Heart sounds: No murmur.  Pulmonary:     Effort: Pulmonary effort is normal. No respiratory distress.     Breath sounds: Normal breath sounds.  Lymphadenopathy:     Cervical: No cervical adenopathy.  Skin:    General: Skin is warm and dry.     Capillary Refill: Capillary refill takes less than 2 seconds.  Neurological:     Mental Status: He is alert.      Rapid flu: positive for A     Assessment & Plan:   Problem List Items Addressed This Visit    None    Visit Diagnoses    Influenza A    -  Primary   Relevant Medications   oseltamivir (TAMIFLU) 75 MG capsule   Viral URI with cough  Relevant Medications   oseltamivir (TAMIFLU) 75 MG capsule   Generalized body aches       Relevant Orders   POCT Influenza A/B (Completed)     Symptomatic care  Return if symptoms worsen or fail to improve.  Lynnda Child, MD

## 2018-04-08 NOTE — Patient Instructions (Addendum)
You have the flu.   This is highly contagious -- you are contagious 1-2 days before you develop symptoms and for up to 7 days after becoming sick.   Oseltamivir (Tamiflu) -- is helpful if started within 48 hours of symptoms. Ideally within 24 hours of symptoms. This may decrease the length of illness by 1 day and decrease the severity of your illness.    Based on your symptoms, it looks like you have a virus.   Antibiotics are not need for a viral infection but the following will help:   1. Drink plenty of fluids 2. Get lots of rest  Sinus Congestion 1) Saline spray - 3 times a day 2) Flonase (Store Brand ok) - once daily 3) Over the counter congestion medications  Cough 1) Cough drops can be helpful 2) Nyquil (or nighttime cough medication) 3) Honey is proven to be one of the best cough medications   Sore Throat 1) Honey as above, cough drops 2) Ibuprofen or Aleve can be helpful 3) Salt water Gargles  If you develop fevers (Temperature >100.4), chills, worsening symptoms or symptoms lasting longer than 10 days return to clinic.

## 2018-05-05 ENCOUNTER — Encounter: Payer: Self-pay | Admitting: Emergency Medicine

## 2018-05-05 ENCOUNTER — Other Ambulatory Visit: Payer: Self-pay

## 2018-05-05 ENCOUNTER — Ambulatory Visit
Admission: EM | Admit: 2018-05-05 | Discharge: 2018-05-05 | Disposition: A | Discharge status: Home / Self Care | Payer: 59 | Attending: Emergency Medicine | Admitting: Emergency Medicine

## 2018-05-05 DIAGNOSIS — M109 Gout, unspecified: Secondary | ICD-10-CM

## 2018-05-05 MED ORDER — OXYCODONE-ACETAMINOPHEN 5-325 MG PO TABS: 1.0000 | ORAL_TABLET | Freq: Four times a day (QID) | ORAL | 0 refills | Status: DC | PRN

## 2018-05-05 MED ORDER — PREDNISONE 10 MG PO TABS: ORAL_TABLET | ORAL | 0 refills | Status: DC

## 2018-05-05 NOTE — ED Triage Notes (Signed)
Patient c/o gout flare on right great toe that started on Saturday.

## 2018-05-05 NOTE — ED Provider Notes (Signed)
[facility] ____________________________________________  Time seen: Approximately 8:39 AM  I have reviewed the triage vital signs and the nursing notes.   HISTORY  Chief Complaint Gout   HPI Leonard Pierce is a 38 y.o. male presenting for evaluation of right great toe pain since Saturday.  Patient reports woke up with pain.  States pain is constant, aching, throbbing and tender to light touch, currently moderate.  Denies fall, injury, trauma or break in skin.  Has continued remain ambulatory.  States that he did try a leftover prednisone without resolution.  States pain is consistent with previous gout.  No recent gout flareup.  Denies any recent cardiac changes.  History of CAD and MI.  Not diabetic.  Denies pain radiation, paresthesias or decreased range of motion.  Reports otherwise doing well.  Glori Luis, MD: PCP    Past Medical History:  Diagnosis Date  . Acute MI (HCC)    Reports MI at 19, seen at [facility]   . Coronary artery disease   . Diverticulitis 2017  . Gout     Patient Active Problem List   Diagnosis Date Noted  . Chest pain 07/02/2017  . Prediabetes 03/11/2017  . Gastroenteritis 12/02/2016  . Calcific Achilles tendinitis 11/27/2016  . Morbid obesity (HCC) 11/19/2016  . Elevated BP without diagnosis of hypertension 11/19/2016  . Peroneal tendinitis 11/19/2016  . Diverticulitis of colon     Past Surgical History:  Procedure Laterality Date  . COLONOSCOPY WITH PROPOFOL N/A 12/06/2015   Procedure: COLONOSCOPY WITH PROPOFOL;  Surgeon: Midge Minium, MD;  Location: [facility] ENDOSCOPY;  Service: Endoscopy;  Laterality: N/A;  . NO PAST SURGERIES     per patient 10/13/15  . TONSILLECTOMY       No current facility-administered medications for this encounter.   Current Outpatient Medications:  .  oxyCODONE-acetaminophen (PERCOCET/ROXICET) 5-325 MG tablet, Take 1 tablet by mouth every 6 (six) hours as needed for severe pain. Do not drive while  taking as can cause drowsiness., Disp: 8 tablet, Rfl: 0 .  predniSONE (DELTASONE) 10 MG tablet, Start 60 mg po day one, then 50 mg po day two, taper by 10 mg daily until complete., Disp: 21 tablet, Rfl: 0  Allergies Patient has no known allergies.  Family History  Problem Relation Age of Onset  . Non-Hodgkin's lymphoma Mother   . Hypertension Mother   . Diabetes Father   . Heart disease Maternal Grandfather   . Diabetes Maternal Grandfather   . Renal Disease Maternal Grandfather   . Diabetes Maternal Grandmother   . Heart disease Maternal Grandmother   . Diabetes Paternal Grandmother   . Heart disease Paternal Grandmother   . Renal Disease Paternal Grandmother   . Diabetes Paternal Grandfather   . Heart disease Paternal Grandfather 7  . Renal Disease Paternal Grandfather     Social History Social History   Tobacco Use  . Smoking status: Never Smoker  . Smokeless tobacco: Never Used  Substance Use Topics  . Alcohol use: Yes    Alcohol/week: 2.0 standard drinks    Types: 2 Cans of beer per week  . Drug use: No    Review of Systems Constitutional: No fever Cardiovascular: Denies chest pain. Respiratory: Denies shortness of breath. Gastrointestinal: No abdominal pain.   Musculoskeletal: As above.  Skin: Negative for rash.   ____________________________________________   PHYSICAL EXAM:  VITAL SIGNS: ED Triage Vitals  Enc Vitals Group     BP 05/05/18 0820 (!) 147/95  Pulse Rate 05/05/18 0820 64     Resp 05/05/18 0820 18     Temp 05/05/18 0820 98.3 F (36.8 C)     Temp Source 05/05/18 0820 Oral     SpO2 05/05/18 0820 99 %     Weight 05/05/18 0820 (!) 350 lb (158.8 kg)     Height 05/05/18 0820 5\' 8"  (1.727 m)     Head Circumference --      Peak Flow --      Pain Score 05/05/18 0818 8     Pain Loc --      Pain Edu? --      Excl. in GC? --     Constitutional: Alert and oriented. Well appearing and in no acute distress.Marland Kitchen ENT      Head: Normocephalic  and atraumatic. Cardiovascular: Normal rate, regular rhythm. Grossly normal heart sounds.  Good peripheral circulation. Respiratory: Normal respiratory effort without tachypnea nor retractions. Breath sounds are clear and equal bilaterally. No wheezes, rales, rhonchi. Musculoskeletal: Steady gait. Bilateral pedal pulses equal and easily palpated. Except: Right great toe at first MTP joint and mildly to proximal first phalanx tenderness to palpation with mild localized edema, skin intact, full range of motion present, normal distal sensation and capillary refill, right foot otherwise nontender.  No right lower extremity edema noted. Neurologic:  Normal speech and language. Speech is normal. No gait instability.  Skin:  Skin is warm, dry and intact. No rash noted. Psychiatric: Mood and affect are normal. Speech and behavior are normal. Patient exhibits appropriate insight and judgment   ___________________________________________   LABS (all labs ordered are listed, but only abnormal results are displayed)  Labs Reviewed - No data to display  PROCEDURES Procedures    INITIAL IMPRESSION / ASSESSMENT AND PLAN / ED COURSE  Pertinent labs & imaging results that were available during my care of the patient were reviewed by me and considered in my medical decision making (see chart for details).  Well-appearing patient.  No acute distress.  Clinical presentation consistent with gout.  Will treat with prednisone and PRN breakthrough Percocet. [place] controlled substance database reviewed, no recent controlled substances documented.  Encourage elevation, supportive care and monitor for dietary trigger.Discussed indication, risks and benefits of medications with patient.  Discussed follow up and return parameters including no resolution or any worsening concerns. Patient verbalized understanding and agreed to plan.   ____________________________________________   FINAL CLINICAL  IMPRESSION(S) / ED DIAGNOSES  Final diagnoses:  Acute gout involving toe of right foot, unspecified cause     ED Discharge Orders         Ordered    predniSONE (DELTASONE) 10 MG tablet     05/05/18 0836    oxyCODONE-acetaminophen (PERCOCET/ROXICET) 5-325 MG tablet  Every 6 hours PRN     05/05/18 0839           Note: This dictation was prepared with Dragon dictation along with smaller phrase technology. Any transcriptional errors that result from this process are unintentional.         Renford Dills, NP 05/05/18 [phone]

## 2018-05-05 NOTE — Discharge Instructions (Addendum)
Take medication as prescribed. Rest. Drink plenty of fluids.  ° °Follow up with your primary care physician this week as needed. Return to Urgent care for new or worsening concerns.  ° °

## 2018-05-07 ENCOUNTER — Encounter: Payer: Self-pay | Admitting: Emergency Medicine

## 2018-05-07 ENCOUNTER — Ambulatory Visit
Admission: EM | Admit: 2018-05-07 | Discharge: 2018-05-07 | Disposition: A | Discharge status: Home / Self Care | Payer: Self-pay

## 2018-05-07 ENCOUNTER — Other Ambulatory Visit: Payer: Self-pay

## 2018-05-07 DIAGNOSIS — M109 Gout, unspecified: Secondary | ICD-10-CM

## 2018-05-07 NOTE — ED Triage Notes (Signed)
Pt c/o pain in his right great toe. He was seen on 05/05/18 for gout and treated with prednisone. He states that it is better but still sore.

## 2018-05-07 NOTE — Discharge Instructions (Signed)
Take medication as prescribed. Rest. Drink plenty of fluids.  ° °Follow up with your primary care physician this week as needed. Return to Urgent care for new or worsening concerns.  ° °

## 2018-05-07 NOTE — ED Provider Notes (Signed)
[facility] ____________________________________________  Time seen: Approximately 8:44 AM  I have reviewed the triage vital signs and the nursing notes.   HISTORY  Chief Complaint Foot Pain (right)   HPI Leonard Pierce is a 38 y.o. male with history of gout who was seen on Monday for the same complaints presenting for reevaluation.  Patient has been taking the prednisone and Percocet as needed since Monday.  States he is feeling much better, and further states he has no pain at this time in his right foot.  States his right foot is still somewhat sore causing him to limp while at work.  States due to this he was sent home and needs a work note.  States again no pain.  Reports otherwise feeling better.  No swelling.  Denies other accompanying complaints.    Past Medical History:  Diagnosis Date   Acute MI (HCC)    Reports MI at 32, seen at [facility]    Coronary artery disease    Diverticulitis 2017   Gout     Patient Active Problem List   Diagnosis Date Noted   Chest pain 07/02/2017   Prediabetes 03/11/2017   Gastroenteritis 12/02/2016   Calcific Achilles tendinitis 11/27/2016   Morbid obesity (HCC) 11/19/2016   Elevated BP without diagnosis of hypertension 11/19/2016   Peroneal tendinitis 11/19/2016   Diverticulitis of colon     Past Surgical History:  Procedure Laterality Date   COLONOSCOPY WITH PROPOFOL N/A 12/06/2015   Procedure: COLONOSCOPY WITH PROPOFOL;  Surgeon: Midge Minium, MD;  Location: [facility] ENDOSCOPY;  Service: Endoscopy;  Laterality: N/A;   NO PAST SURGERIES     per patient 10/13/15   TONSILLECTOMY       No current facility-administered medications for this encounter.   Current Outpatient Medications:    oxyCODONE-acetaminophen (PERCOCET/ROXICET) 5-325 MG tablet, Take 1 tablet by mouth every 6 (six) hours as needed for severe pain. Do not drive while taking as can cause drowsiness., Disp: 8 tablet, Rfl: 0   predniSONE  (DELTASONE) 10 MG tablet, Start 60 mg po day one, then 50 mg po day two, taper by 10 mg daily until complete., Disp: 21 tablet, Rfl: 0  Allergies Patient has no known allergies.  Family History  Problem Relation Age of Onset   Non-Hodgkin's lymphoma Mother    Hypertension Mother    Diabetes Father    Heart disease Maternal Grandfather    Diabetes Maternal Grandfather    Renal Disease Maternal Grandfather    Diabetes Maternal Grandmother    Heart disease Maternal Grandmother    Diabetes Paternal Grandmother    Heart disease Paternal Grandmother    Renal Disease Paternal Grandmother    Diabetes Paternal Grandfather    Heart disease Paternal Grandfather 55   Renal Disease Paternal Grandfather     Social History Social History   Tobacco Use   Smoking status: Never Smoker   Smokeless tobacco: Never Used  Substance Use Topics   Alcohol use: Yes    Alcohol/week: 2.0 standard drinks    Types: 2 Cans of beer per week   Drug use: No    Review of Systems Constitutional: No fever Cardiovascular: Denies chest pain. Respiratory: Denies shortness of breath. Gastrointestinal: No abdominal pain.  Musculoskeletal as above Skin: Negative for rash.   ____________________________________________   PHYSICAL EXAM:  VITAL SIGNS: ED Triage Vitals  Enc Vitals Group     BP 05/07/18 0816 (!) 148/99     Pulse Rate 05/07/18 0816 62  Resp 05/07/18 0816 18     Temp 05/07/18 0816 98.2 F (36.8 C)     Temp Source 05/07/18 0816 Oral     SpO2 05/07/18 0816 99 %     Weight 05/07/18 0813 (!) 350 lb 1.5 oz (158.8 kg)     Height 05/07/18 0813 5\' 8"  (1.727 m)     Head Circumference --      Peak Flow --      Pain Score 05/07/18 0813 8     Pain Loc --      Pain Edu? --      Excl. in GC? --     Constitutional: Alert and oriented. Well appearing and in no acute distress. ENT      Head: Normocephalic and atraumatic. Cardiovascular: Normal rate, regular rhythm. Grossly  normal heart sounds.  Good peripheral circulation. Respiratory: Normal respiratory effort without tachypnea nor retractions. Breath sounds are clear and equal bilaterally. No wheezes, rales, rhonchi. Gastrointestinal: Soft and nontender. No distention. Normal Bowel sounds. No CVA tenderness. Musculoskeletal: Ambulatory with minimal antalgic gait.  Bilateral pedal pulses equal and easily palpated.  Right first MTP joint minimal tenderness to palpation, no edema, skin intact, no erythema.  Right foot otherwise nontender. Neurologic:  Normal speech and language.Speech is normal. No gait instability.  Skin:  Skin is warm, dry and intact. No rash noted. Psychiatric: Mood and affect are normal. Speech and behavior are normal. Patient exhibits appropriate insight and judgment   ___________________________________________   LABS (all labs ordered are listed, but only abnormal results are displayed)  Labs Reviewed - No data to display   PROCEDURES Procedures     INITIAL IMPRESSION / ASSESSMENT AND PLAN / ED COURSE  Pertinent labs & imaging results that were available during my care of the patient were reviewed by me and considered in my medical decision making (see chart for details).  Well-appearing patient.  No acute distress.  Continue medication as directed and as needed.  Supportive care.  Work note given.  Discussed follow up and return parameters including no resolution or any worsening concerns. Patient verbalized understanding and agreed to plan.   ____________________________________________   FINAL CLINICAL IMPRESSION(S) / ED DIAGNOSES  Final diagnoses:  Acute gout of right foot, unspecified cause     ED Discharge Orders    None       Note: This dictation was prepared with Dragon dictation along with smaller phrase technology. Any transcriptional errors that result from this process are unintentional.         Renford DillsMiller, Idrees Quam, NP 05/07/18 [phone]

## 2018-06-08 IMAGING — CT CT ABD-PELV W/ CM
2 of 4 series · 16 of 46 positions shown, 18 images · IV contrast (APPLIED)
Comparison: CT 05/09/2012

CLINICAL DATA: Acute left lower quadrant pain with profuse
diarrhea. Also right lower quadrant pain radiating down right leg.

EXAM:
CT ABDOMEN AND PELVIS WITH CONTRAST
TECHNIQUE: Multidetector CT imaging of the abdomen and pelvis was performed
using the standard protocol following bolus administration of
intravenous contrast.
CONTRAST:  100 cc Isovue 370 IV

[Series 2: axial st · axial · 0.98mm/px · z∈[-996,-546]mm · 13 of 100 slices shown, 15 images]
[im 5/100  soft-tissue]
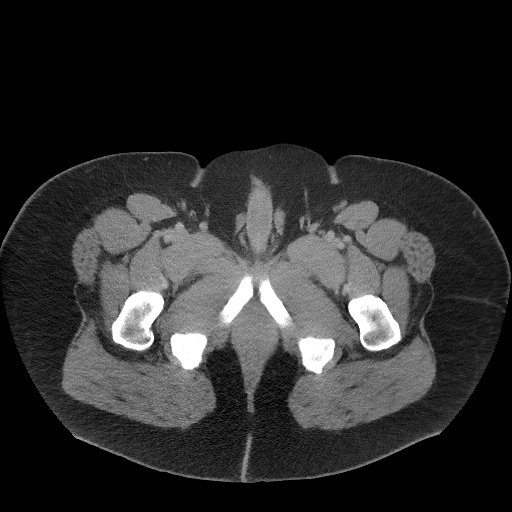
[im 5/100  bone]
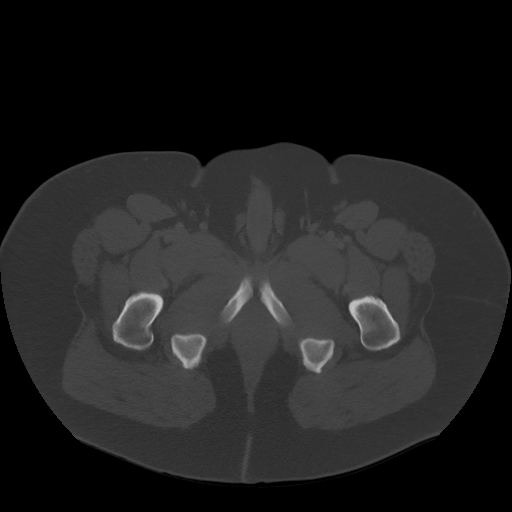
[im 13/100  soft-tissue]
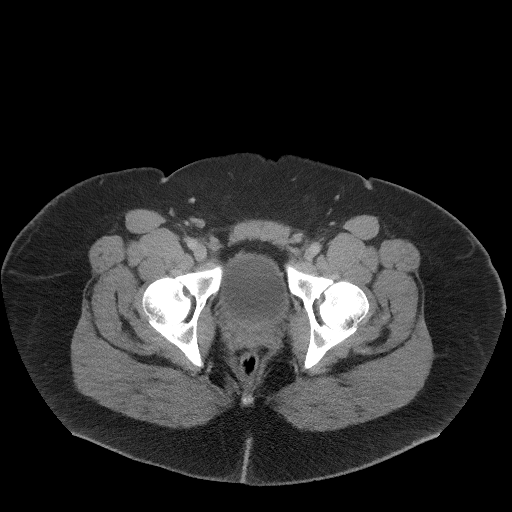
[im 21/100  soft-tissue]
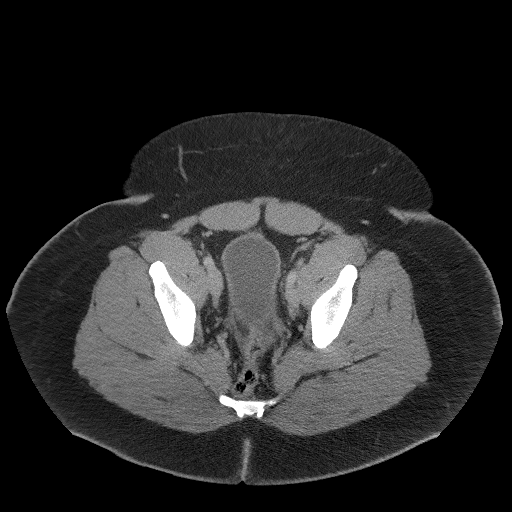
[im 29/100  soft-tissue]
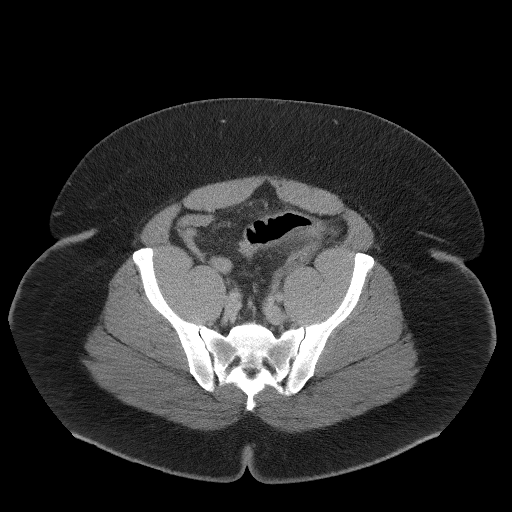
[im 34/100  soft-tissue]
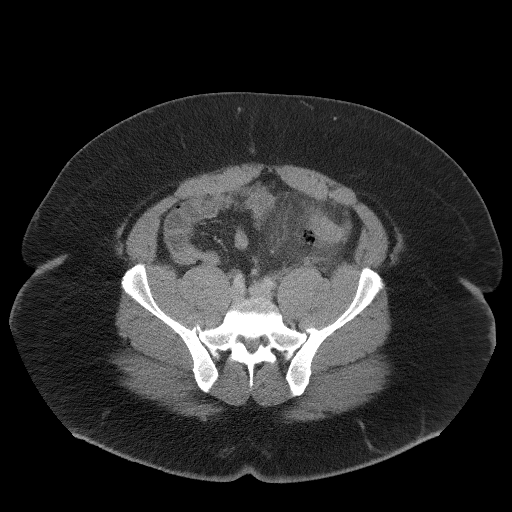
[im 42/100  soft-tissue]
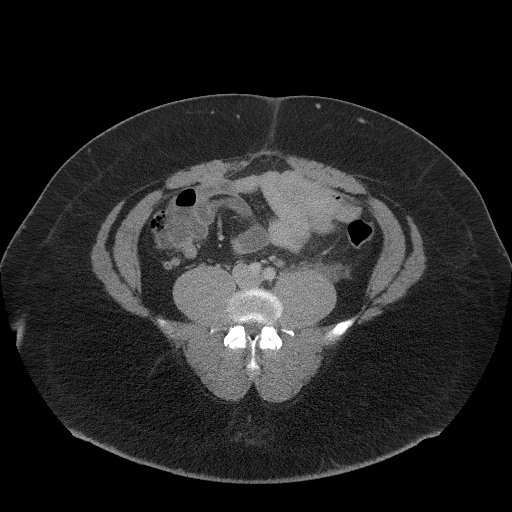
[im 50/100  soft-tissue]
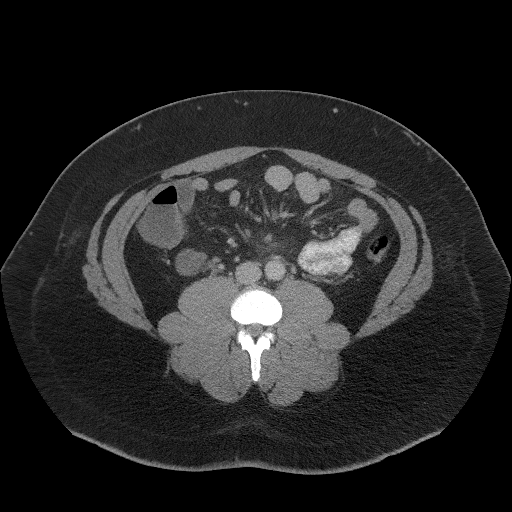
[im 58/100  soft-tissue]
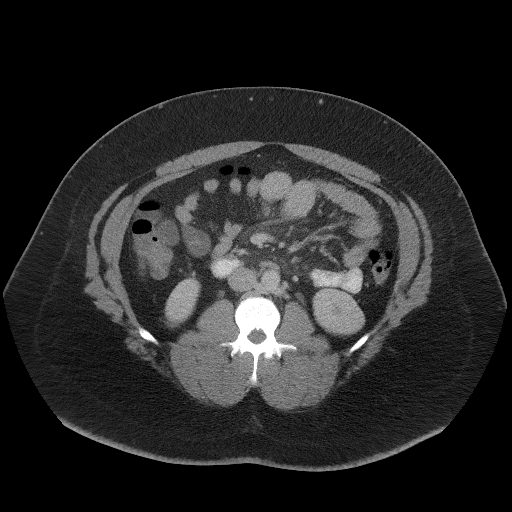
[im 67/100  soft-tissue]
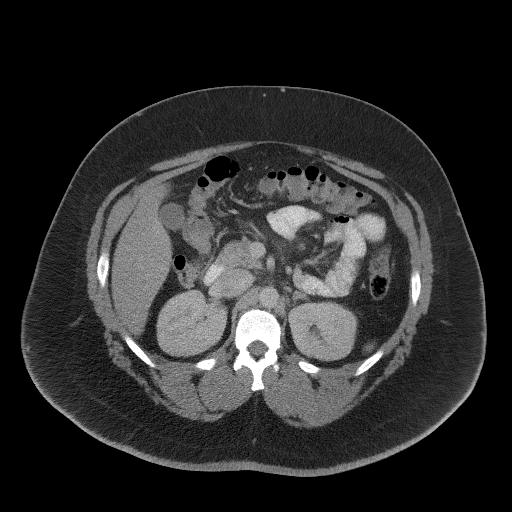
[im 67/100  bone]
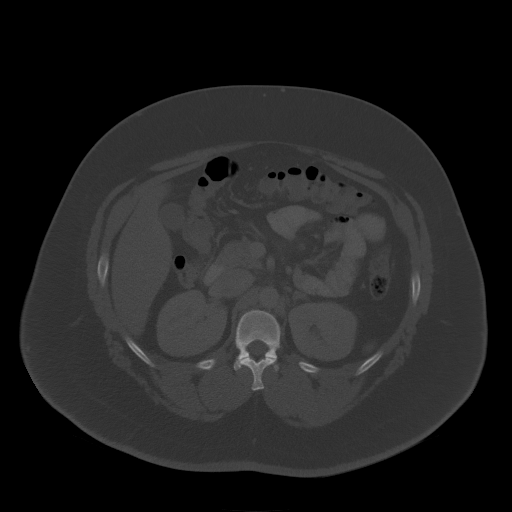
[im 71/100  soft-tissue]
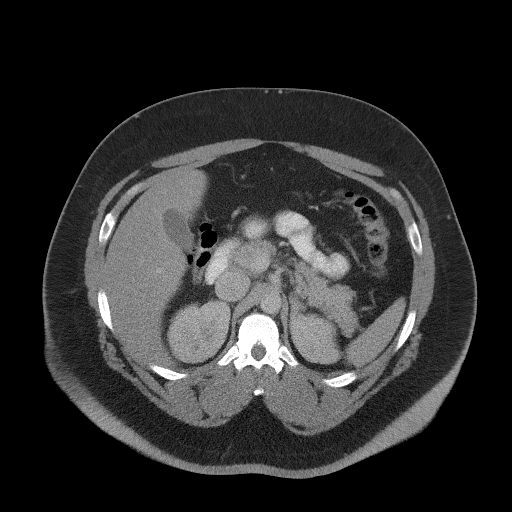
[im 79/100  soft-tissue]
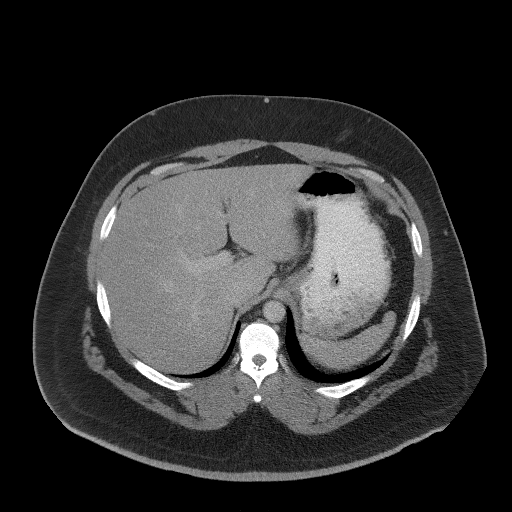
[im 87/100  soft-tissue]
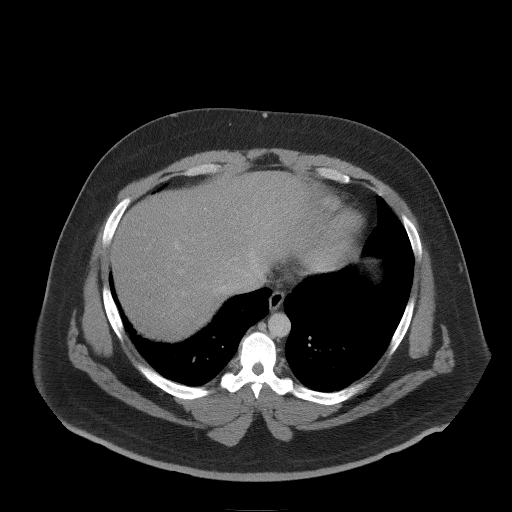
[im 95/100  soft-tissue]
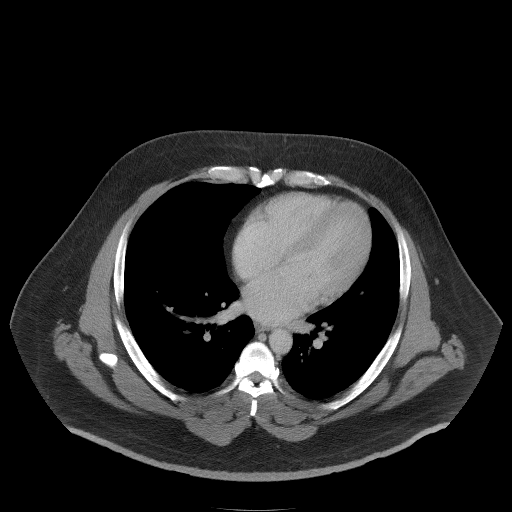

[Series 5: coronal st · coronal · 0.87mm/px · 3 of 121 slices shown]
[im 41/121  soft-tissue]
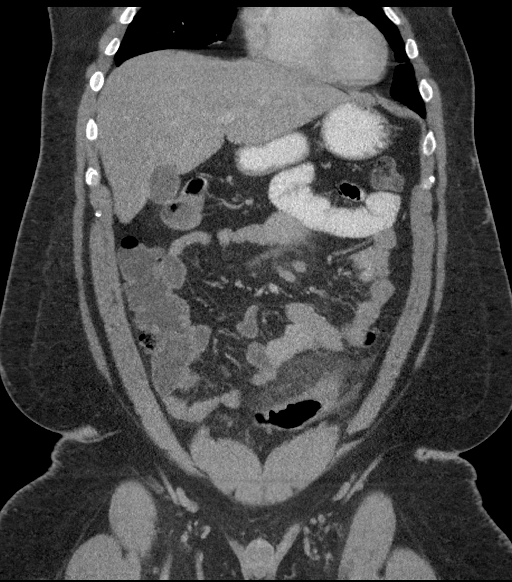
[im 54/121  soft-tissue]
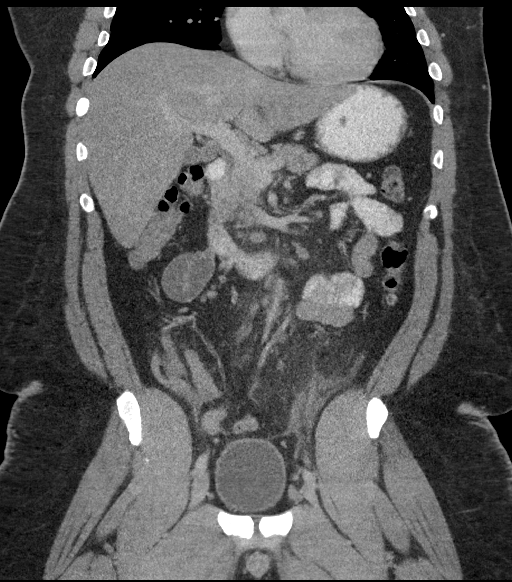
[im 67/121  soft-tissue]
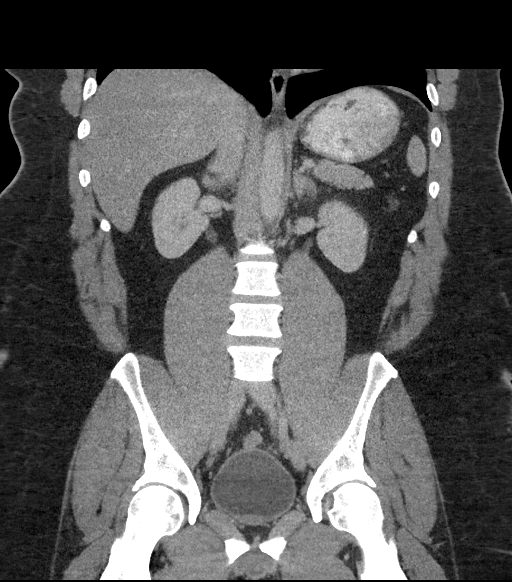

[16 of 46 positions shown; findings below may reference images not displayed]

FINDINGS: Lower chest: Linear atelectasis in both lower lobes. No pleural
fluid.

Liver: Decreased density consistent with steatosis. No focal lesion.

Hepatobiliary: Gallbladder physiologically distended, no calcified
stone. No biliary dilatation.

Pancreas: No ductal dilatation or inflammation.

Spleen: Normal.

Adrenal glands: No nodule.

Kidneys: Symmetric renal enhancement. No hydronephrosis. No
perinephric edema.

Stomach/Bowel: Stomach physiologically distended. There are no
dilated or thickened small bowel loops. Acute diverticulitis
involving the proximal sigmoid colon with adjacent colonic wall
thickening. There are tiny foci of adjacent extraluminal air
consistent with micro perforation. No abscess or focal fluid
collection. Small amount of free fluid tracks and pericolic gutter
and in the pelvis. The appendix is normal.

Vascular/Lymphatic: Mesenteric haziness with prominent mesenteric
lymph nodes is unchanged from prior CT. Abdominal aorta is normal in
caliber. Circumaortic left renal vein. Mesenteric vessels are
patent.

Reproductive: No acute abnormality.

Bladder: Physiologically distended without wall thickening.

Other: No tracking free air outside of the sigmoid colonic micro
perforation. No intra-abdominal abscess. No upper abdominal ascites.

Musculoskeletal: There are no acute or suspicious osseous
abnormalities.
IMPRESSION: 1. Acute diverticulitis involving the proximal sigmoid colon with
small foci of extraluminal air consist with micro perforation. No
abscess.
2. Mesenteric haziness and prominent mesenteric lymph nodes are
unchanged from exam 3 years prior. This favors a reactive or
inflammatory etiology.
3. Hepatic steatosis.
These results were called by telephone at the time of interpretation
on 09/02/2015 at [DATE] to Dr. URLUIALA MORETHEYKNOW , who verbally
acknowledged these results.

## 2018-07-30 ENCOUNTER — Ambulatory Visit
Admission: EM | Admit: 2018-07-30 | Discharge: 2018-07-30 | Disposition: A | Discharge status: Home / Self Care | Payer: Managed Care, Other (non HMO) | Attending: Emergency Medicine | Admitting: Emergency Medicine

## 2018-07-30 ENCOUNTER — Encounter: Payer: Self-pay | Admitting: Emergency Medicine

## 2018-07-30 ENCOUNTER — Other Ambulatory Visit: Payer: Self-pay

## 2018-07-30 DIAGNOSIS — M109 Gout, unspecified: Secondary | ICD-10-CM

## 2018-07-30 MED ORDER — PREDNISONE 20 MG PO TABS: 40.0000 mg | ORAL_TABLET | Freq: Every day | ORAL | 0 refills | Status: AC

## 2018-07-30 MED ORDER — HYDROCODONE-ACETAMINOPHEN 5-325 MG PO TABS: 1.0000 | ORAL_TABLET | Freq: Four times a day (QID) | ORAL | 0 refills | Status: DC | PRN

## 2018-07-30 MED ORDER — INDOMETHACIN ER 75 MG PO CPCR: 75.0000 mg | ORAL_CAPSULE | Freq: Two times a day (BID) | ORAL | 0 refills | Status: DC

## 2018-07-30 MED ORDER — COLCHICINE 0.6 MG PO TABS: ORAL_TABLET | ORAL | 0 refills | Status: DC

## 2018-07-30 NOTE — ED Triage Notes (Signed)
Patient c/o pain in his left ankle that started on Sunday.  Patient states he has had gout before.  Patient denies injury or falls.

## 2018-07-30 NOTE — Discharge Instructions (Addendum)
Take the indomethacin, prednisone and the colchicine as directed.  Hydrocodone for severe pain only.  Rest your ankle for several days.

## 2018-07-30 NOTE — ED Provider Notes (Signed)
HPI  SUBJECTIVE:  Leonard Pierce is a 38 y.o. male who presents with posterior left ankle pain starting yesterday.  Describes it as achy, constant.  States that he is compliant with a low purine diet.  He reports increased physical activity, is walking 2-1/2 miles a day in addition to doing other activities which is new for him.  No trauma.  No ankle redness, body aches, fevers, ankle swelling, limitation of motion, distal numbness or tingling.  He reports hypersensitivity.  He states that this is identical to previous gout flares.  No knee or foot pain.  He has tried elevation with some improvement in symptoms.  Symptoms are worse with palpation.  Patient states he is unable to wear a shoe due to the hypersensitivity.  He has a past medical history of gout, MI.  No history of kidney disease, diabetes.  ZOX:[id number]:Sonnenberg, Yehuda MaoEric G, MD  Patient was seen here twice in April for great toe pain thought to be gout.  He was prescribed prednisone and Percocet with improvement in his symptoms.  Seen in the ED in 2018 for left posterior lateral ankle pain that was thought to be peroneal tendinitis.  No history of kidney disease.  Creatinine has been normal since 2018.  Past Medical History:  Diagnosis Date  . Acute MI (HCC)    [facility] at 19, [facility]   . Coronary artery disease   . Diverticulitis 2017  . Gout     Past Surgical History:  Procedure Laterality Date  . COLONOSCOPY WITH PROPOFOL N/A 12/06/2015   Procedure: COLONOSCOPY WITH PROPOFOL;  Surgeon: Midge Miniumarren Wohl, MD;  Location: [facility] ENDOSCOPY;  Service: Endoscopy;  Laterality: N/A;  . NO PAST SURGERIES     per patient 10/13/15  . TONSILLECTOMY      Family History  Problem Relation Age of Onset  . Non-Hodgkin's lymphoma Mother   . Hypertension Mother   . Diabetes Father   . Heart disease Maternal Grandfather   . Diabetes Maternal Grandfather   . Renal Disease Maternal Grandfather   . Diabetes Maternal Grandmother   . Heart disease  Maternal Grandmother   . Diabetes Paternal Grandmother   . Heart disease Paternal Grandmother   . Renal Disease Paternal Grandmother   . Diabetes Paternal Grandfather   . Heart disease Paternal Grandfather [id number]  . Renal Disease Paternal Grandfather     Social History   Tobacco Use  . Smoking status: Never Smoker  . Smokeless tobacco: Never Used  Substance Use Topics  . Alcohol use: Yes    Alcohol/week: 2.0 standard drinks    Types: 2 Cans of beer per week  . Drug use: No    No current facility-administered medications for this encounter.   Current Outpatient Medications:  .  colchicine 0.6 MG tablet, 2 tabs po x 1, then one tab po 1 hour later, Disp: 6 tablet, Rfl: 0 .  HYDROcodone-acetaminophen (NORCO/VICODIN) 5-325 MG tablet, Take 1-2 tablets by mouth every 6 (six) hours as needed for moderate pain., Disp: 12 tablet, Rfl: 0 .  indomethacin (INDOCIN SR) 75 MG CR capsule, Take 1 capsule (75 mg total) by mouth 2 (two) times daily with a meal., Disp: 21 capsule, Rfl: 0 .  predniSONE (DELTASONE) 20 MG tablet, Take 2 tablets (40 mg total) by mouth daily with breakfast for 5 days., Disp: 10 tablet, Rfl: 0  No Known Allergies   ROS  As noted in HPI.   Physical Exam  BP (!) 139/94 (BP  Location: Right Arm)   Pulse 70   Temp 98.2 F (36.8 C) (Oral)   Resp 16   Ht 5\' 8"  (1.727 m)   Wt (!) 158.8 kg   SpO2 99%   BMI 53.22 kg/m   Constitutional: Well developed, well nourished, no acute distress Eyes:  EOMI, conjunctiva normal bilaterally HENT: Normocephalic, atraumatic,mucus membranes moist Respiratory: Normal inspiratory effort Cardiovascular: Normal rate GI: nondistended skin: No rash, skin intact Musculoskeletal: Left ankle: Positive tenderness along the Achilles tendon, particularly at the insertion.  Abnormal soft tissue defect.  Positive tenderness over the medial and lateral ligaments.  No erythema, swelling.  No malleolar or fibular tenderness.  Mild tenderness over  the plantar fascia.  No tenderness over the calcaneus.  No tenderness over the tarsals, metatarsals.  No increased temperature.  Pain with dorsiflexion.  No pain with inversion/eversion.  DP 2+.  Sensation distally grossly intact.  Patient able to move all toes.  Knee, foot nontender. Neurologic: Alert & oriented x 3, no focal neuro deficits Psychiatric: Speech and behavior appropriate   ED Course   Medications - No data to display  No orders of the defined types were placed in this encounter.   No results found for this or any previous visit (from the past 24 hour(s)). No results found.  ED Clinical Impression  1. Acute gout of left ankle, unspecified cause      ED Assessment/Plan  Previous Records, labs reviewed.  As noted in HPI.  [place] Narcotic database reviewed for this patient, and feel that the risk/benefit ratio today is favorable for proceeding with a prescription for controlled substance.  Last opiate prescription was 05/23/2018  Presentation consistent with gout, however do suspect a component of Achilles tendinitis with his increased physical activity.  Will send home with colchicine, prednisone, indomethacin as patient states that this works well for him.  Short course of Norco for severe pain.  Will write a note to return to work on Friday.  Doubt infection/septic joint.  No indications for imaging.  Follow-up with PMD as needed, to the ER if he gets worse.  Discussed MDM, treatment plan, and plan for follow-up with patient. Discussed sn/sx that should prompt return to the ED. patient agrees with plan.   Meds ordered this encounter  Medications  . colchicine 0.6 MG tablet    Sig: 2 tabs po x 1, then one tab po 1 hour later    Dispense:  6 tablet    Refill:  0  . indomethacin (INDOCIN SR) 75 MG CR capsule    Sig: Take 1 capsule (75 mg total) by mouth 2 (two) times daily with a meal.    Dispense:  21 capsule    Refill:  0  . HYDROcodone-acetaminophen (NORCO/VICODIN)  5-325 MG tablet    Sig: Take 1-2 tablets by mouth every 6 (six) hours as needed for moderate pain.    Dispense:  12 tablet    Refill:  0  . predniSONE (DELTASONE) 20 MG tablet    Sig: Take 2 tablets (40 mg total) by mouth daily with breakfast for 5 days.    Dispense:  10 tablet    Refill:  0    *This clinic note was created using [employer]. Therefore, there may be occasional mistakes despite careful proofreading.   ?    Melynda Ripple, MD 07/30/18 [phone]

## 2020-06-02 ENCOUNTER — Emergency Department (HOSPITAL_COMMUNITY)
Admission: EM | Admit: 2020-06-02 | Discharge: 2020-06-02 | Disposition: A | Discharge status: Home / Self Care | Payer: Managed Care, Other (non HMO) | Attending: Emergency Medicine | Admitting: Emergency Medicine

## 2020-06-02 ENCOUNTER — Other Ambulatory Visit: Payer: Self-pay

## 2020-06-02 ENCOUNTER — Encounter (HOSPITAL_COMMUNITY): Payer: Self-pay | Admitting: Emergency Medicine

## 2020-06-02 DIAGNOSIS — B349 Viral infection, unspecified: Secondary | ICD-10-CM | POA: Insufficient documentation

## 2020-06-02 DIAGNOSIS — I2510 Atherosclerotic heart disease of native coronary artery without angina pectoris: Secondary | ICD-10-CM | POA: Insufficient documentation

## 2020-06-02 LAB — CBC WITH DIFFERENTIAL/PLATELET
Abs Immature Granulocytes: 0.04 10*3/uL (ref 0.00–0.07)
Basophils Absolute: 0.1 10*3/uL (ref 0.0–0.1)
Basophils Relative: 1 %
Eosinophils Absolute: 0.1 10*3/uL (ref 0.0–0.5)
Eosinophils Relative: 1 %
HCT: 40.8 % (ref 39.0–52.0)
Hemoglobin: 13.2 g/dL (ref 13.0–17.0)
Immature Granulocytes: 1 %
Lymphocytes Relative: 31 %
Lymphs Abs: 2.1 10*3/uL (ref 0.7–4.0)
MCH: 28.5 pg (ref 26.0–34.0)
MCHC: 32.4 g/dL (ref 30.0–36.0)
MCV: 88.1 fL (ref 80.0–100.0)
Monocytes Absolute: 0.8 10*3/uL (ref 0.1–1.0)
Monocytes Relative: 11 %
Neutro Abs: 3.9 10*3/uL (ref 1.7–7.7)
Neutrophils Relative %: 55 %
Platelets: 226 10*3/uL (ref 150–400)
RBC: 4.63 MIL/uL (ref 4.22–5.81)
RDW: 13.8 % (ref 11.5–15.5)
WBC: 6.9 10*3/uL (ref 4.0–10.5)
nRBC: 0 % (ref 0.0–0.2)

## 2020-06-02 LAB — COMPREHENSIVE METABOLIC PANEL
ALT: 29 U/L (ref 0–44)
AST: 23 U/L (ref 15–41)
Albumin: 3.6 g/dL (ref 3.5–5.0)
Alkaline Phosphatase: 57 U/L (ref 38–126)
Anion gap: 8 (ref 5–15)
BUN: 17 mg/dL (ref 6–20)
CO2: 28 mmol/L (ref 22–32)
Calcium: 9.1 mg/dL (ref 8.9–10.3)
Chloride: 102 mmol/L (ref 98–111)
Creatinine, Ser: 1.11 mg/dL (ref 0.61–1.24)
GFR, Estimated: >60 mL/min (ref >60)
Glucose, Bld: 143 mg/dL — ABNORMAL HIGH (ref 70–99)
Potassium: 3.7 mmol/L (ref 3.5–5.1)
Sodium: 138 mmol/L (ref 135–145)
Total Bilirubin: 0.7 mg/dL (ref 0.3–1.2)
Total Protein: 7.1 g/dL (ref 6.5–8.1)

## 2020-06-02 MED ORDER — SODIUM CHLORIDE 0.9 % IV BOLUS
1000.0000 mL | Freq: Once | INTRAVENOUS | Status: AC
  Administered 2020-06-02: 1000 mL via INTRAVENOUS

## 2020-06-02 NOTE — ED Triage Notes (Addendum)
Pt states he has had abdominal pain for the past two days with Diarrhea.

## 2020-06-02 NOTE — ED Provider Notes (Signed)
[facility] EMERGENCY DEPARTMENT Provider Note   CSN: [id number] Arrival date & time: 06/02/20  3903     History Chief Complaint  Patient presents with  . Abdominal Pain    Leonard Pierce is a 40 y.o. male.  Patient complains of mild abdominal discomfort with diarrhea for couple days no fevers no chills no vomiting  The history is provided by the patient and medical records. No language interpreter was used.  Abdominal Pain Pain location:  Generalized Pain quality: aching   Pain radiates to:  Does not radiate Pain severity:  Moderate Onset quality:  Sudden Timing:  Constant Progression:  Waxing and waning Chronicity:  New Context: not alcohol use   Associated symptoms: no chest pain, no cough, no diarrhea, no fatigue and no hematuria        Past Medical History:  Diagnosis Date  . Acute MI (HCC)    Reports MI at 19, seen at [facility]   . Coronary artery disease   . Diverticulitis 2017  . Gout     Patient Active Problem List   Diagnosis Date Noted  . Chest pain 07/02/2017  . Prediabetes 03/11/2017  . Gastroenteritis 12/02/2016  . Calcific Achilles tendinitis 11/27/2016  . Morbid obesity (HCC) 11/19/2016  . Elevated BP without diagnosis of hypertension 11/19/2016  . Peroneal tendinitis 11/19/2016  . Diverticulitis of colon     Past Surgical History:  Procedure Laterality Date  . COLONOSCOPY WITH PROPOFOL N/A 12/06/2015   Procedure: COLONOSCOPY WITH PROPOFOL;  Surgeon: Midge Minium, MD;  Location: [facility] ENDOSCOPY;  Service: Endoscopy;  Laterality: N/A;  . NO PAST SURGERIES     per patient 10/13/15  . TONSILLECTOMY         Family History  Problem Relation Age of Onset  . Non-Hodgkin's lymphoma Mother   . Hypertension Mother   . Diabetes Father   . Heart disease Maternal Grandfather   . Diabetes Maternal Grandfather   . Renal Disease Maternal Grandfather   . Diabetes Maternal Grandmother   . Heart disease Maternal Grandmother   . Diabetes Paternal  Grandmother   . Heart disease Paternal Grandmother   . Renal Disease Paternal Grandmother   . Diabetes Paternal Grandfather   . Heart disease Paternal Grandfather 16  . Renal Disease Paternal Grandfather     Social History   Tobacco Use  . Smoking status: Never Smoker  . Smokeless tobacco: Never Used  Vaping Use  . Vaping Use: Never used  Substance Use Topics  . Alcohol use: Yes    Alcohol/week: 2.0 standard drinks    Types: 2 Cans of beer per week  . Drug use: No    Home Medications Prior to Admission medications   Medication Sig Start Date End Date Taking? Authorizing Provider  colchicine 0.6 MG tablet 2 tabs po x 1, then one tab po 1 hour later 07/30/18   Domenick Gong, MD  HYDROcodone-acetaminophen (NORCO/VICODIN) 5-325 MG tablet Take 1-2 tablets by mouth every 6 (six) hours as needed for moderate pain. 07/30/18   Domenick Gong, MD  indomethacin (INDOCIN SR) 75 MG CR capsule Take 1 capsule (75 mg total) by mouth 2 (two) times daily with a meal. 07/30/18   Domenick Gong, MD    Allergies    Patient has no known allergies.  Review of Systems   Review of Systems  Constitutional: Negative for appetite change and fatigue.  HENT: Negative for congestion, ear discharge and sinus pressure.   Eyes: Negative for discharge.  Respiratory:  Negative for cough.   Cardiovascular: Negative for chest pain.  Gastrointestinal: Positive for abdominal pain. Negative for diarrhea.  Genitourinary: Negative for frequency and hematuria.  Musculoskeletal: Negative for back pain.  Skin: Negative for rash.  Neurological: Negative for seizures and headaches.  Psychiatric/Behavioral: Negative for hallucinations.    Physical Exam Updated Vital Signs BP (!) 143/85   Pulse 79   Temp 97.7 F (36.5 C) (Oral)   Resp 16   Ht 5\' 10"  (1.778 m)   Wt (!) 145.2 kg   SpO2 100%   BMI 45.92 kg/m   Physical Exam Vitals and nursing note reviewed.  Constitutional:      Appearance: He is  well-developed.  HENT:     Head: Normocephalic.     Nose: Nose normal.  Eyes:     General: No scleral icterus.    Conjunctiva/sclera: Conjunctivae normal.  Neck:     Thyroid: No thyromegaly.  Cardiovascular:     Rate and Rhythm: Normal rate and regular rhythm.     Heart sounds: No murmur heard. No friction rub. No gallop.   Pulmonary:     Breath sounds: No stridor. No wheezing or rales.  Chest:     Chest wall: No tenderness.  Abdominal:     General: There is no distension.     Tenderness: There is no abdominal tenderness. There is no rebound.  Musculoskeletal:        General: Normal range of motion.     Cervical back: Neck supple.  Lymphadenopathy:     Cervical: No cervical adenopathy.  Skin:    Findings: No erythema or rash.  Neurological:     Mental Status: He is alert and oriented to person, place, and time.     Motor: No abnormal muscle tone.     Coordination: Coordination normal.  Psychiatric:        Behavior: Behavior normal.     ED Results / Procedures / Treatments   Labs (all labs ordered are listed, but only abnormal results are displayed) Labs Reviewed  COMPREHENSIVE METABOLIC PANEL - Abnormal; Notable for the following components:      Result Value   Glucose, Bld 143 (*)    All other components within normal limits  CBC WITH DIFFERENTIAL/PLATELET    EKG None  Radiology No results found.  Procedures Procedures   Medications Ordered in ED Medications  sodium chloride 0.9 % bolus 1,000 mL (1,000 mLs Intravenous New Bag/Given 06/02/20 08/02/20)    ED Course  I have reviewed the triage vital signs and the nursing notes.  Pertinent labs & imaging results that were available during my care of the patient were reviewed by me and considered in my medical decision making (see chart for details).    MDM Rules/Calculators/A&P                          Labs unremarkable.  Patient with viral syndrome causing abdominal cramps and some diarrhea.  He will  follow-up with PCP or GI if not improving Final Clinical Impression(s) / ED Diagnoses Final diagnoses:  Viral syndrome    Rx / DC Orders ED Discharge Orders    None       [id number], MD 06/02/20 [phone]

## 2020-06-02 NOTE — Discharge Instructions (Signed)
Drink plenty of fluids.  Take Imodium for occasional diarrhea.  Tylenol or Motrin for pain.  Follow-up with a family doctor or Dr. Jena Gauss if any problems

## 2020-07-21 ENCOUNTER — Encounter (HOSPITAL_COMMUNITY): Payer: Self-pay | Admitting: Emergency Medicine

## 2020-07-21 ENCOUNTER — Other Ambulatory Visit: Payer: Self-pay

## 2020-07-21 ENCOUNTER — Emergency Department (HOSPITAL_COMMUNITY)
Admission: EM | Admit: 2020-07-21 | Discharge: 2020-07-21 | Disposition: A | Discharge status: Home / Self Care | Payer: Self-pay | Attending: Emergency Medicine | Admitting: Emergency Medicine

## 2020-07-21 DIAGNOSIS — R600 Localized edema: Secondary | ICD-10-CM | POA: Insufficient documentation

## 2020-07-21 DIAGNOSIS — I2510 Atherosclerotic heart disease of native coronary artery without angina pectoris: Secondary | ICD-10-CM | POA: Insufficient documentation

## 2020-07-21 DIAGNOSIS — M79674 Pain in right toe(s): Secondary | ICD-10-CM | POA: Insufficient documentation

## 2020-07-21 MED ORDER — COLCHICINE 0.6 MG PO TABS: 0.6000 mg | ORAL_TABLET | Freq: Two times a day (BID) | ORAL | 0 refills | Status: DC

## 2020-07-21 MED ORDER — OXYCODONE-ACETAMINOPHEN 5-325 MG PO TABS: 1.0000 | ORAL_TABLET | ORAL | 0 refills | Status: DC | PRN

## 2020-07-21 MED ORDER — COLCHICINE 0.6 MG PO TABS
0.6000 mg | ORAL_TABLET | Freq: Once | ORAL | Status: AC
  Administered 2020-07-21: 0.6 mg via ORAL
  Filled 2020-07-21: qty 1

## 2020-07-21 NOTE — ED Notes (Signed)
Pt put on card monitor

## 2020-07-21 NOTE — ED Provider Notes (Signed)
[facility] EMERGENCY DEPARTMENT Provider Note   CSN: [id number] Arrival date & time: 07/21/20  0913     History Chief Complaint  Patient presents with   Foot Pain    Leonard Pierce is a 40 y.o. male.   Foot Pain       Leonard Pierce is a 40 y.o. male who presents to the Emergency Department complaining of aching, throbbing pain to his right great toe.  Symptoms have been present for 1 day.  He reports history of gout to the same toe and current symptoms feel similar.  Pain is focal to the base of the toe.  Pain is worse with wearing boots, standing or walking.  He has tried several over-the-counter therapies and treatments at home without relief.  He denies injury, numbness of his leg or foot, fever or chills redness or skin changes.   Past Medical History:  Diagnosis Date   Acute MI (HCC)    Reports MI at 74, seen at [facility]    Coronary artery disease    Diverticulitis 2017   Gout     Patient Active Problem List   Diagnosis Date Noted   Chest pain 07/02/2017   Prediabetes 03/11/2017   Gastroenteritis 12/02/2016   Calcific Achilles tendinitis 11/27/2016   Morbid obesity (HCC) 11/19/2016   Elevated BP without diagnosis of hypertension 11/19/2016   Peroneal tendinitis 11/19/2016   Diverticulitis of colon     Past Surgical History:  Procedure Laterality Date   COLONOSCOPY WITH PROPOFOL N/A 12/06/2015   Procedure: COLONOSCOPY WITH PROPOFOL;  Surgeon: Midge Minium, MD;  Location: [facility] ENDOSCOPY;  Service: Endoscopy;  Laterality: N/A;   NO PAST SURGERIES     per patient 10/13/15   TONSILLECTOMY         Family History  Problem Relation Age of Onset   Non-Hodgkin's lymphoma Mother    Hypertension Mother    Diabetes Father    Heart disease Maternal Grandfather    Diabetes Maternal Grandfather    Renal Disease Maternal Grandfather    Diabetes Maternal Grandmother    Heart disease Maternal Grandmother    Diabetes Paternal Grandmother    Heart disease Paternal  Grandmother    Renal Disease Paternal Grandmother    Diabetes Paternal Grandfather    Heart disease Paternal Grandfather 24   Renal Disease Paternal Grandfather     Social History   Tobacco Use   Smoking status: Never   Smokeless tobacco: Never  Vaping Use   Vaping Use: Never used  Substance Use Topics   Alcohol use: Yes    Alcohol/week: 2.0 standard drinks    Types: 2 Cans of beer per week   Drug use: No    Home Medications Prior to Admission medications   Medication Sig Start Date End Date Taking? Authorizing Provider  colchicine 0.6 MG tablet 2 tabs po x 1, then one tab po 1 hour later 07/30/18   Domenick Gong, MD  HYDROcodone-acetaminophen (NORCO/VICODIN) 5-325 MG tablet Take 1-2 tablets by mouth every 6 (six) hours as needed for moderate pain. 07/30/18   Domenick Gong, MD  indomethacin (INDOCIN SR) 75 MG CR capsule Take 1 capsule (75 mg total) by mouth 2 (two) times daily with a meal. 07/30/18   Domenick Gong, MD    Allergies    Patient has no known allergies.  Review of Systems   Review of Systems  Constitutional:  Negative for chills and fever.  Genitourinary:  Negative for difficulty urinating and dysuria.  Musculoskeletal:  Positive for arthralgias (Right great toe pain). Negative for back pain and gait problem.  Skin:  Negative for color change and wound.  Neurological:  Negative for weakness and numbness.   Physical Exam Updated Vital Signs BP (!) 151/84   Pulse 93   Temp 97.7 F (36.5 C) (Oral)   Resp 18   Ht 5\' 10"  (1.778 m)   Wt (!) 158.8 kg   SpO2 98%   BMI 50.22 kg/m   Physical Exam Vitals and nursing note reviewed.  Constitutional:      Appearance: Normal appearance. He is not ill-appearing.  Cardiovascular:     Rate and Rhythm: Normal rate and regular rhythm.     Pulses: Normal pulses.  Pulmonary:     Effort: Pulmonary effort is normal. No respiratory distress.  Chest:     Chest wall: No tenderness.  Abdominal:     Palpations:  Abdomen is soft.     Tenderness: There is no abdominal tenderness.  Musculoskeletal:        General: Tenderness present. No swelling or signs of injury.     Comments: Ttp of at the base of right first MTP joint.  Mild edema, no skin changes.  Skin:    General: Skin is warm.     Capillary Refill: Capillary refill takes less than 2 seconds.     Findings: No erythema or rash.  Neurological:     General: No focal deficit present.     Mental Status: He is alert.     Sensory: No sensory deficit.     Motor: No weakness.    ED Results / Procedures / Treatments   Labs (all labs ordered are listed, but only abnormal results are displayed) Labs Reviewed - No data to display  EKG None  Radiology No results found.  Procedures Procedures   Medications Ordered in ED Medications  colchicine tablet 0.6 mg (has no administration in time range)    ED Course  I have reviewed the triage vital signs and the nursing notes.  Pertinent labs & imaging results that were available during my care of the patient were reviewed by me and considered in my medical decision making (see chart for details).    MDM Rules/Calculators/A&P                          Patient with well-known history of gout, here for pain of the right great toe.  No known injury.  Symptoms began 1 day ago.  Similar to previous gout flares.  Foot is neurovascularly intact.  No appreciable erythema or skin changes to suggest infection.  Pain to the MTP joint of the right great toe. No history of renal insufficiency.  No diabetes.  Patient agreeable to short course of pain medication and colchicine.  He appears appropriate for discharge home and agrees to close follow-up with PCP.  Final Clinical Impression(s) / ED Diagnoses Final diagnoses:  Pain of right great toe    Rx / DC Orders ED Discharge Orders     None        07/21/20 1020    07/23/20, MD 07/25/20 [phone]

## 2020-07-21 NOTE — ED Triage Notes (Signed)
Pt states he is having a gout flare up in his right big toe.

## 2020-07-21 NOTE — Discharge Instructions (Addendum)
Take the medication as directed until your symptoms improve.  Follow-up with your primary doctor for recheck.

## 2020-09-13 ENCOUNTER — Other Ambulatory Visit: Payer: Self-pay

## 2020-09-13 ENCOUNTER — Emergency Department
Admission: EM | Admit: 2020-09-13 | Discharge: 2020-09-13 | Disposition: A | Discharge status: Home / Self Care | Payer: Self-pay | Attending: Emergency Medicine | Admitting: Emergency Medicine

## 2020-09-13 ENCOUNTER — Encounter: Payer: Self-pay | Admitting: Emergency Medicine

## 2020-09-13 DIAGNOSIS — M79675 Pain in left toe(s): Secondary | ICD-10-CM | POA: Diagnosis present

## 2020-09-13 DIAGNOSIS — I2510 Atherosclerotic heart disease of native coronary artery without angina pectoris: Secondary | ICD-10-CM | POA: Insufficient documentation

## 2020-09-13 DIAGNOSIS — M10072 Idiopathic gout, left ankle and foot: Secondary | ICD-10-CM | POA: Insufficient documentation

## 2020-09-13 DIAGNOSIS — M1000 Idiopathic gout, unspecified site: Secondary | ICD-10-CM

## 2020-09-13 MED ORDER — COLCHICINE 0.6 MG PO TABS: 0.6000 mg | ORAL_TABLET | Freq: Two times a day (BID) | ORAL | 2 refills | Status: AC

## 2020-09-13 MED ORDER — INDOMETHACIN 25 MG PO CAPS: 25.0000 mg | ORAL_CAPSULE | Freq: Three times a day (TID) | ORAL | 0 refills | Status: AC

## 2020-09-13 MED ORDER — HYDROCODONE-ACETAMINOPHEN 5-325 MG PO TABS: 1.0000 | ORAL_TABLET | Freq: Four times a day (QID) | ORAL | 0 refills | Status: DC | PRN

## 2020-09-13 MED ORDER — KETOROLAC TROMETHAMINE 60 MG/2ML IM SOLN
60.0000 mg | Freq: Once | INTRAMUSCULAR | Status: AC
  Administered 2020-09-13: 60 mg via INTRAMUSCULAR
  Filled 2020-09-13: qty 2

## 2020-09-13 NOTE — Discharge Instructions (Addendum)
Follow a low purine diet.  Instructions are attached.  Use your medication as prescribed.  Return if worsening

## 2020-09-13 NOTE — ED Notes (Signed)
See triage note presents with pain to left great toe  Hx of gout  Denies nay injury

## 2020-09-13 NOTE — ED Provider Notes (Signed)
[facility] Emergency Department Provider Note  ____________________________________________   Event Date/Time   First MD Initiated Contact with Patient 09/13/20 0740     (approximate)  I have reviewed the triage vital signs and the nursing notes.   HISTORY  Chief Complaint Toe Pain    HPI Leonard Pierce is a 40 y.o. male presents emergency department with complaints of a gout attack.  Patient states that he is having swelling along the left great toe.  States area is very painful.  Past Medical History:  Diagnosis Date   Acute MI (HCC)    Reports MI at 29, seen at [facility]    Coronary artery disease    Diverticulitis 2017   Gout     Patient Active Problem List   Diagnosis Date Noted   Chest pain 07/02/2017   Prediabetes 03/11/2017   Gastroenteritis 12/02/2016   Calcific Achilles tendinitis 11/27/2016   Morbid obesity (HCC) 11/19/2016   Elevated BP without diagnosis of hypertension 11/19/2016   Peroneal tendinitis 11/19/2016   Diverticulitis of colon     Past Surgical History:  Procedure Laterality Date   COLONOSCOPY WITH PROPOFOL N/A 12/06/2015   Procedure: COLONOSCOPY WITH PROPOFOL;  Surgeon: Midge Minium, MD;  Location: [facility] ENDOSCOPY;  Service: Endoscopy;  Laterality: N/A;   NO PAST SURGERIES     per patient 10/13/15   TONSILLECTOMY      Prior to Admission medications   Medication Sig Start Date End Date Taking? Authorizing Provider  colchicine 0.6 MG tablet Take 1 tablet (0.6 mg total) by mouth 2 (two) times daily. 09/13/20 09/13/21 Yes Maxima Skelton, Roselyn Bering, PA-C  HYDROcodone-acetaminophen (NORCO/VICODIN) 5-325 MG tablet Take 1 tablet by mouth every 6 (six) hours as needed for moderate pain. 09/13/20  Yes Kimeka Badour, Roselyn Bering, PA-C  indomethacin (INDOCIN) 25 MG capsule Take 1 capsule (25 mg total) by mouth 3 (three) times daily with meals. 09/13/20  Yes Faythe Ghee, PA-C    Allergies Patient has no known allergies.  Family History  Problem  Relation Age of Onset   Non-Hodgkin's lymphoma Mother    Hypertension Mother    Diabetes Father    Heart disease Maternal Grandfather    Diabetes Maternal Grandfather    Renal Disease Maternal Grandfather    Diabetes Maternal Grandmother    Heart disease Maternal Grandmother    Diabetes Paternal Grandmother    Heart disease Paternal Grandmother    Renal Disease Paternal Grandmother    Diabetes Paternal Grandfather    Heart disease Paternal Grandfather 59   Renal Disease Paternal Grandfather     Social History Social History   Tobacco Use   Smoking status: Never   Smokeless tobacco: Never  Vaping Use   Vaping Use: Never used  Substance Use Topics   Alcohol use: Yes    Alcohol/week: 2.0 standard drinks    Types: 2 Cans of beer per week   Drug use: No    Review of Systems  Constitutional: No fever/chills Eyes: No visual changes. ENT: No sore throat. Respiratory: Denies cough Cardiovascular: Denies chest pain Gastrointestinal: Denies abdominal pain Genitourinary: Negative for dysuria. Musculoskeletal: Negative for back pain.  Positive for left great toe pain Skin: Negative for rash. Psychiatric: no mood changes,     ____________________________________________   PHYSICAL EXAM:  VITAL SIGNS: ED Triage Vitals  Enc Vitals Group     BP 09/13/20 0738 (!) 160/91     Pulse Rate 09/13/20 0738 62     Resp 09/13/20  0738 16     Temp 09/13/20 0738 97.7 F (36.5 C)     Temp Source 09/13/20 0738 Oral     SpO2 09/13/20 0738 99 %     Weight 09/13/20 0730 (!) 320 lb (145.2 kg)     Height 09/13/20 0730 5\' 10"  (1.778 m)     Head Circumference --      Peak Flow --      Pain Score 09/13/20 0729 8     Pain Loc --      Pain Edu? --      Excl. in GC? --     Constitutional: Alert and oriented. Well appearing and in no acute distress. Eyes: Conjunctivae are normal.  Head: Atraumatic. Nose: No congestion/rhinnorhea. Mouth/Throat: Mucous membranes are moist.   Neck:   supple no lymphadenopathy noted Cardiovascular: Normal rate, regular rhythm.  Respiratory: Normal respiratory effort.  No retractions, GU: deferred Musculoskeletal: FROM all extremities, warm and well perfused, redness and swelling noted along the left great toe, neurovascular is intact Neurologic:  Normal speech and language.  Skin:  Skin is warm, dry and intact. No rash noted. Psychiatric: Mood and affect are normal. Speech and behavior are normal.  ____________________________________________   LABS (all labs ordered are listed, but only abnormal results are displayed)  Labs Reviewed - No data to display ____________________________________________   ____________________________________________  RADIOLOGY    ____________________________________________   PROCEDURES  Procedure(s) performed: No  Procedures    ____________________________________________   INITIAL IMPRESSION / ASSESSMENT AND PLAN / ED COURSE  Pertinent labs & imaging results that were available during my care of the patient were reviewed by me and considered in my medical decision making (see chart for details).   Patient presents emergency department with gout.  See HPI.  Physical exam is consistent with same.  Patient is given lack Toradol injection 60 mg IM.  He was given a prescription for colchicine, Indocin, and Vicodin.  He is to follow-up with his regular doctor if not improved in 2 to 3 days.  Return if worsening.  Is discharged stable condition.     Leonard Pierce was evaluated in Emergency Department on 09/13/2020 for the symptoms described in the history of present illness. He was evaluated in the context of the global COVID-19 pandemic, which necessitated consideration that the patient might be at risk for infection with the SARS-CoV-2 virus that causes COVID-19. Institutional protocols and algorithms that pertain to the evaluation of patients at risk for COVID-19 are in a state of rapid  change based on information released by regulatory bodies including the CDC and federal and state organizations. These policies and algorithms were followed during the patient's care in the ED.    As part of my medical decision making, I reviewed the following data within the electronic MEDICAL RECORD NUMBER Nursing notes reviewed and incorporated, Old chart reviewed, Notes from prior ED visits, and [place] Controlled Substance Database  ____________________________________________   FINAL CLINICAL IMPRESSION(S) / ED DIAGNOSES  Final diagnoses:  Idiopathic gout, unspecified chronicity, unspecified site      NEW MEDICATIONS STARTED DURING THIS VISIT:  New Prescriptions   COLCHICINE 0.6 MG TABLET    Take 1 tablet (0.6 mg total) by mouth 2 (two) times daily.   HYDROCODONE-ACETAMINOPHEN (NORCO/VICODIN) 5-325 MG TABLET    Take 1 tablet by mouth every 6 (six) hours as needed for moderate pain.   INDOMETHACIN (INDOCIN) 25 MG CAPSULE    Take 1 capsule (25 mg total) by mouth  3 (three) times daily with meals.     Note:  This document was prepared using Dragon voice recognition software and may include unintentional dictation errors.    Faythe Ghee, PA-C 09/13/20 1124    Minna Antis, MD 09/13/20 1319

## 2020-09-13 NOTE — ED Triage Notes (Signed)
Pt reports a gout flare up in his left big toe. Pt states hx of the same and knows what it is. Pain started last pm

## 2020-12-20 ENCOUNTER — Encounter: Payer: Self-pay | Admitting: Emergency Medicine

## 2020-12-20 ENCOUNTER — Emergency Department
Admission: EM | Admit: 2020-12-20 | Discharge: 2020-12-20 | Disposition: A | Discharge status: Home / Self Care | Payer: 59 | Attending: Student in an Organized Health Care Education/Training Program | Admitting: Student in an Organized Health Care Education/Training Program

## 2020-12-20 ENCOUNTER — Other Ambulatory Visit: Payer: Self-pay

## 2020-12-20 DIAGNOSIS — I2510 Atherosclerotic heart disease of native coronary artery without angina pectoris: Secondary | ICD-10-CM | POA: Insufficient documentation

## 2020-12-20 DIAGNOSIS — H6501 Acute serous otitis media, right ear: Secondary | ICD-10-CM | POA: Insufficient documentation

## 2020-12-20 MED ORDER — ACETAMINOPHEN 325 MG PO TABS
650.0000 mg | ORAL_TABLET | Freq: Once | ORAL | Status: AC
  Administered 2020-12-20: 650 mg via ORAL
  Filled 2020-12-20: qty 2

## 2020-12-20 MED ORDER — AMOXICILLIN 500 MG PO CAPS: 500.0000 mg | ORAL_CAPSULE | Freq: Three times a day (TID) | ORAL | 0 refills | Status: AC

## 2020-12-20 NOTE — ED Notes (Signed)
E-signature pad unavailable - Pt verbalized understanding of D/C information - no additional concerns at this time.  

## 2020-12-20 NOTE — ED Triage Notes (Signed)
Patient ambulatory to triage with steady gait, without difficulty or distress noted; pt reports rt earache since last night

## 2020-12-20 NOTE — ED Provider Notes (Signed)
[facility] Emergency Department Provider Note    Event Date/Time   First MD Initiated Contact with Patient 12/20/20 0631     (approximate)  I have reviewed the triage vital signs and the nursing notes.   HISTORY  Chief Complaint Otalgia    HPI Leonard Pierce is a 40 y.o. male with no concerning past medical history presents to the ER for evaluation of 24 hours of right ear pain congestion.  States is throbbing in nature.  Feels like it is consistent with ear infection.  Feels like he is starting to have trouble hearing out of that ear and felt like it was popping as if he was changing altitude earlier today.  No trouble swallowing.  Denies any chest pain no nausea or vomiting.  States he tested negative for COVID.  Past Medical History:  Diagnosis Date   Acute MI (HCC)    Reports MI at 35, seen at [facility]    Coronary artery disease    Diverticulitis 2017   Gout    Family History  Problem Relation Age of Onset   Non-Hodgkin's lymphoma Mother    Hypertension Mother    Diabetes Father    Heart disease Maternal Grandfather    Diabetes Maternal Grandfather    Renal Disease Maternal Grandfather    Diabetes Maternal Grandmother    Heart disease Maternal Grandmother    Diabetes Paternal Grandmother    Heart disease Paternal Grandmother    Renal Disease Paternal Grandmother    Diabetes Paternal Grandfather    Heart disease Paternal Grandfather 49   Renal Disease Paternal Grandfather    Past Surgical History:  Procedure Laterality Date   COLONOSCOPY WITH PROPOFOL N/A 12/06/2015   Procedure: COLONOSCOPY WITH PROPOFOL;  Surgeon: Midge Minium, MD;  Location: [facility] ENDOSCOPY;  Service: Endoscopy;  Laterality: N/A;   NO PAST SURGERIES     per patient 10/13/15   TONSILLECTOMY     Patient Active Problem List   Diagnosis Date Noted   Chest pain 07/02/2017   Prediabetes 03/11/2017   Gastroenteritis 12/02/2016   Calcific Achilles tendinitis 11/27/2016    Morbid obesity (HCC) 11/19/2016   Elevated BP without diagnosis of hypertension 11/19/2016   Peroneal tendinitis 11/19/2016   Diverticulitis of colon       Prior to Admission medications   Medication Sig Start Date End Date Taking? Authorizing Provider  amoxicillin (AMOXIL) 500 MG capsule Take 1 capsule (500 mg total) by mouth 3 (three) times daily for 7 days. 12/20/20 12/27/20 Yes Willy Eddy, MD  colchicine 0.6 MG tablet Take 1 tablet (0.6 mg total) by mouth 2 (two) times daily. 09/13/20 09/13/21  Fisher, Roselyn Bering, PA-C  HYDROcodone-acetaminophen (NORCO/VICODIN) 5-325 MG tablet Take 1 tablet by mouth every 6 (six) hours as needed for moderate pain. 09/13/20   Fisher, Roselyn Bering, PA-C  indomethacin (INDOCIN) 25 MG capsule Take 1 capsule (25 mg total) by mouth 3 (three) times daily with meals. 09/13/20   Faythe Ghee, PA-C    Allergies Patient has no known allergies.    Social History Social History   Tobacco Use   Smoking status: Never   Smokeless tobacco: Never  Vaping Use   Vaping Use: Never used  Substance Use Topics   Alcohol use: Yes    Alcohol/week: 2.0 standard drinks    Types: 2 Cans of beer per week   Drug use: No    Review of Systems Patient denies headaches, rhinorrhea, blurry vision, numbness, shortness of  breath, chest pain, edema, cough, abdominal pain, nausea, vomiting, diarrhea, dysuria, fevers, rashes or hallucinations unless otherwise stated above in HPI. ____________________________________________   PHYSICAL EXAM:  VITAL SIGNS: Vitals:   12/20/20 0622  BP: (!) 169/95  Pulse: 78  Resp: 18  Temp: 97.7 F (36.5 C)  SpO2: 98%    Constitutional: Alert and oriented. Well appearing and in no acute distress. Eyes: Conjunctivae are normal.  Head: Atraumatic. Nose: + congestion/rhinnorhea. Ear: Right tympanic membrane is erythematous with dullness.  Does have clear effusion.  No mastoid tenderness.  Left TM normal. Mouth/Throat: Mucous membranes  are moist.   Neck: Painless ROM.  Cardiovascular:   Good peripheral circulation. Respiratory: Normal respiratory effort.  No retractions.  Gastrointestinal: Soft and nontender.  Musculoskeletal: No lower extremity tenderness .  No joint effusions. Neurologic:  Normal speech and language. No gross focal neurologic deficits are appreciated.  Skin:  Skin is warm, dry and intact. No rash noted. Psychiatric: Mood and affect are normal. Speech and behavior are normal.  ____________________________________________   LABS (all labs ordered are listed, but only abnormal results are displayed)  No results found for this or any previous visit (from the past 24 hour(s)). ____________________________________________ ____________________________________________  RADIOLOGY   ____________________________________________   PROCEDURES  Procedure(s) performed:  Procedures    Critical Care performed: no ____________________________________________   INITIAL IMPRESSION / ASSESSMENT AND PLAN / ED COURSE  Pertinent labs & imaging results that were available during my care of the patient were reviewed by me and considered in my medical decision making (see chart for details).   DDX: uri, flu, covid, aom, mastoiditis  Leonard Pierce is a 40 y.o. who presents to the ED with presentation as described above.  Patient clinically well-appearing in no acute distress.  Does have clinical findings consistent with acute otitis media of the right.  I did recommend testing for the flu given oral prevalence currently patient is declined this.  Preferring instead to trial wait-and-see method with prescription for amoxicillin sent to his pharmacy.  Does appear stable and appropriate for outpatient follow-up and treatment.  The patient was evaluated in Emergency Department today for the symptoms described in the history of present illness. He/she was evaluated in the context of the global COVID-19 pandemic, which  necessitated consideration that the patient might be at risk for infection with the SARS-CoV-2 virus that causes COVID-19. Institutional protocols and algorithms that pertain to the evaluation of patients at risk for COVID-19 are in a state of rapid change based on information released by regulatory bodies including the CDC and federal and state organizations. These policies and algorithms were followed during the patient's care in the ED.       ____________________________________________   FINAL CLINICAL IMPRESSION(S) / ED DIAGNOSES  Final diagnoses:  Non-recurrent acute serous otitis media of right ear      NEW MEDICATIONS STARTED DURING THIS VISIT:  New Prescriptions   AMOXICILLIN (AMOXIL) 500 MG CAPSULE    Take 1 capsule (500 mg total) by mouth 3 (three) times daily for 7 days.     Note:  This document was prepared using Dragon voice recognition software and may include unintentional dictation errors.     Willy Eddy, MD 12/20/20 [phone]

## 2021-01-18 ENCOUNTER — Ambulatory Visit: Payer: Self-pay | Admitting: *Deleted

## 2021-01-18 NOTE — Telephone Encounter (Signed)
Second attempt to call patient- left message to call back 

## 2021-01-18 NOTE — Telephone Encounter (Signed)
Reason for Disposition  Foot pain  Answer Assessment - Initial Assessment Questions 1. ONSET: "When did the pain start?"      Several years now 2. LOCATION: "Where is the pain located?"      Right 3. PAIN: "How bad is the pain?"    (Scale 1-10; or mild, moderate, severe)  - MILD (1-3): doesn't interfere with normal activities.   - MODERATE (4-7): interferes with normal activities (e.g., work or school) or awakens from sleep, limping.   - SEVERE (8-10): excruciating pain, unable to do any normal activities, unable to walk.      8-9  5. CAUSE: "What do you think is causing the foot pain?"     Gout flare up  6. OTHER SYMPTOMS: "Do you have any other symptoms?" (e.g., leg pain, rash, fever, numbness)     Swollen, painful to touch  Protocols used: Foot Pain-A-AH

## 2021-01-18 NOTE — Telephone Encounter (Signed)
Summary: gout flare up / swollen foot / pain to touch   Patient currently does not have a PCP / Patient experiencing gout flare up from time to time right foot swelling and pain to touch.     Attempted to call patient- left message to call back.

## 2021-01-18 NOTE — Telephone Encounter (Signed)
Pt called, he was asking what he does when he has gout flare ups since he has scheduled NPA 03/03/21 at [facility]. Advised him if he has a flare up until he can be seen for his appt and establish care with a provider he would have to go to UC or ED or can do virtual visit but most providers want to see gout and foot to r/o other issues. Pt isnt having acute flare up currently. Pt verbalized understanding for care in the future.

## 2021-01-18 NOTE — Telephone Encounter (Signed)
Third attempt- no answer- left message to call back if needed- will close triage call.

## 2021-03-03 ENCOUNTER — Ambulatory Visit: Payer: Self-pay | Admitting: Family Medicine

## 2022-02-28 ENCOUNTER — Telehealth: Payer: Self-pay

## 2022-02-28 NOTE — Telephone Encounter (Signed)
Received sleep referral from [facility] - Dr Luciano Cutter for morbid obesity and at risk for OSA. Placed in sleep lab mailbox

## 2022-03-28 ENCOUNTER — Ambulatory Visit: Payer: BC Managed Care – PPO | Admitting: Neurology

## 2022-03-28 ENCOUNTER — Encounter: Payer: Self-pay | Admitting: Neurology

## 2022-03-28 VITALS — BP 159/87 | HR 73 | Ht 70.0 in | Wt 398.0 lb

## 2022-03-28 DIAGNOSIS — E662 Morbid (severe) obesity with alveolar hypoventilation: Secondary | ICD-10-CM | POA: Insufficient documentation

## 2022-03-28 DIAGNOSIS — J988 Other specified respiratory disorders: Secondary | ICD-10-CM

## 2022-03-28 DIAGNOSIS — E119 Type 2 diabetes mellitus without complications: Secondary | ICD-10-CM

## 2022-03-28 DIAGNOSIS — G4734 Idiopathic sleep related nonobstructive alveolar hypoventilation: Secondary | ICD-10-CM | POA: Diagnosis not present

## 2022-03-28 DIAGNOSIS — R519 Headache, unspecified: Secondary | ICD-10-CM

## 2022-03-28 DIAGNOSIS — G4733 Obstructive sleep apnea (adult) (pediatric): Secondary | ICD-10-CM | POA: Insufficient documentation

## 2022-03-28 NOTE — Patient Instructions (Signed)
Obesity-Hypoventilation Syndrome Obesity-hypoventilation syndrome (OHS) is a condition in which a person cannot efficiently move air in and out of the lungs (ventilate). This condition causes a buildup of carbon dioxidelevels in the blood and a drop in oxygen levels. OHS can increase the risk for: Cor pulmonale, or right-sided heart failure. Left-sided heart failure. Pulmonary hypertension, or high blood pressure in the arteries in the lungs. Too many red blood cells in the body. Disability or death. What are the causes? This condition may be caused by: Being obese with a BMI (body mass index) greater than or equal to 30 kg/m2. Having too much fat around the abdomen, chest, and neck. The brain being unable to properly manage the high carbon dioxide and low oxygen levels. Hormones made by fat cells. These hormones may interfere with breathing. Sleep apnea. This is when breathing stops, pauses, or is shallow during sleep. What are the signs or symptoms? Symptoms of this condition include: Feeling sleepy during the day. Headaches. These may be worse in the morning. Shortness of breath. Snoring, choking, gasping, or trouble breathing during sleep. Poor concentration or poor memory. Mood changes or feeling irritable. Depression. How is this diagnosed? This condition may be diagnosed by: BMI measurement. Blood tests to measure blood levels of serum bicarbonate, carbon dioxide, and oxygen. Pulse oximetry to measure the amount of oxygen in your blood. This uses a small device that is placed on your finger, earlobe, or toe. Polysomnogram, or sleep study, to check your breathing patterns and levels of oxygen and carbon dioxide while you sleep. You may also have other tests, including: A chest X-ray to rule out other breathing problems. Lung tests, or pulmonary function tests, to rule out other breathing problems. Electrocardiogram (ECG) or echocardiogram to check for signs of heart  failure. How is this treated? This condition may be treated with: A device such as a continuous positive airway pressure (CPAP) machine or a bi-level positive airway pressure (BIPAP) machine. These devices deliver pressure and sometimes oxygen to make breathing easier. A mask may be placed over your nose or mouth. Oxygen if your blood oxygen levels are low. A weight-loss program. Bariatric, or weight-loss, surgery. Tracheostomy. A tube is placed in the windpipe through the neck to help with breathing. Follow these instructions at home:  Medicines Take over-the-counter and prescription medicines only as told by your health care provider. Ask your health care provider what medicines are safe for you. You may be told to avoid medicines such as sedatives and narcotics. These can affect breathing and make OHS worse. Sleeping habits If you are prescribed a CPAP or a BIPAP machine, make sure you understand how to use it. Use your CPAP or BIPAP machine only as told by your health care provider. Try to get at least 8 hours of sleep every night. Eating and drinking  Eat foods that are high in fiber, such as beans, whole grains, and fresh fruits and vegetables. Limit foods that are high in fat and processed sugars, such as fried or sweet foods. Drink enough fluid to keep your urine pale yellow. Do not drink alcohol if: Your health care provider tells you not to drink. You are pregnant, may be pregnant, or are planning to become pregnant. General instructions Follow a diet and exercise plan that helps you reach and keep a healthy weight as told by your health care provider. Exercise regularly as told by your health care provider. Do not use any products that contain nicotine or tobacco. These products  include cigarettes, chewing tobacco, and vaping devices, such as e-cigarettes. If you need help quitting, ask your health care provider. Keep all follow-up visits. This is important. Contact a health  care provider if: You develop new or worsening shortness of breath. You are having trouble waking up or staying awake. You are confused. You develop a cough. You have a fever. Get help right away if: You have chest pain. You have fast or irregular heartbeats. You are dizzy or you faint. You have any symptoms of a stroke. "BE FAST" is an easy way to remember the main warning signs of a stroke: B - Balance. Signs are dizziness, sudden trouble walking, or loss of balance. E - Eyes. Signs are trouble seeing or a sudden change in vision. F - Face. Signs are sudden weakness or numbness of the face, or the face or eyelid drooping on one side. A - Arms. Signs are weakness or numbness in an arm. This happens suddenly and usually on one side of the body. S - Speech. Signs are sudden trouble speaking, slurred speech, or trouble understanding what people say. T - Time. Time to call emergency services. Write down what time symptoms started. You have other signs of a stroke, such as: A sudden, severe headache with no known cause. Nausea or vomiting. Seizure. These symptoms may be an emergency. Get help right away. Call 911. Do not wait to see if the symptoms will go away. Do not drive yourself to the hospital. Summary Obesity-hypoventilation syndrome (OHS) causes a buildup of carbon dioxidelevels in the blood and a drop in oxygen levels. OHS can increase the risk for heart failure, pulmonary hypertension, disability, and death. Follow your diet and exercise plan as told by your health care provider. Get help right away if you have any symptoms of a stroke. This information is not intended to replace advice given to you by your health care provider. Make sure you discuss any questions you have with your health care provider. Document Revised: 08/23/2020 Document Reviewed: 08/23/2020 Elsevier Patient Education  [place].

## 2022-03-28 NOTE — Progress Notes (Signed)
SLEEP MEDICINE CLINIC    Provider:  Larey Seat, MD  Primary Care Physician:  Luciano Cutter, [place] [address]     Referring Provider: [place].    Luciano Cutter, Do [address] [place],  [place] [address]          Chief Complaint according to patient   Patient presents with:     New Patient (Initial Visit)     Pt is well, reports he sleeps a lot and is fatigued throughout the day even with a full night of sleep. He has been having this issue for 2-4 years.       HISTORY OF PRESENT ILLNESS:  Leonard Pierce is a 42 y.o. male patient who is seen upon referral on 03/28/2022 from his new PCP for a sleep consultation.  His  family members have observed snoring and apnea.   Chief concern according to patient :  " about 2 years of increasing daytime sleepiness, fatigue, non restorative sleep, he also gained weight, reached a BMI of 57, waking up sometimes with headaches,  dry mouth. Taking power naps in daytime."    I have the pleasure of seeing Leonard Pierce on 03/28/22 a left-handed AA male patient with a possible OSA. OHV disorder. No previous sleep studies.        Sleep relevant medical history: Nocturia/ times 2,  adenoid and Tonsillectomy at age 40 , rhinitis,  dry eyes.    Family medical /sleep history: Mother with OSA, died of cancer , Non- HL.  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     He and lives in a household alone. No pets. The patient currently works/ shifts(  until 3. 30 AM night/ rotating,) Tobacco use; none .  ETOH use ;none , Caffeine intake in form of Coffee( /) Soda( /) Tea ( /) but energy drinks- one in AM .Exercise in form of  soft ball .   Sleep habits are as follows: The patient's dinner time is between 6-8 PM. The patient goes to bed at 9 PM ,  PM and continues to sleep for 4-5 hours a day,  and rises at 2.30. works at 3.30/AM  The preferred sleep position is prone , with  the support of 2 pillows. Dreams are reportedly rare/in frequent.  The patient wakes up with an alarm. 2.30  AM is the usual rise time. He reports not feeling refreshed or restored in AM, with symptoms such as dry mouth, morning headaches, and residual fatigue. Naps are taken infrequently, lasting from 60 to 90 minutes and are no more  refreshing than nocturnal sleep.    Review of Systems: Out of a complete 14 system review, the patient complains of only the following symptoms, and all other reviewed systems are negative.:  Fatigue, sleepiness , snoring, fragmented sleep, sleep deprived   Insomnia, RLS rarely   How likely are you to doze in the following situations: 0 = not likely, 1 = slight chance, 2 = moderate chance, 3 = high chance   Sitting and Reading? Watching Television? Sitting inactive in a public place (theater or meeting)? As a passenger in a car for an hour without a break? Lying down in the afternoon when circumstances permit? Sitting and talking to someone? Sitting quietly after lunch without alcohol? In a car, while stopped for a few minutes in traffic?   Total = 14/ 24  points   FSS endorsed at 40/ 63 points.    Works shifts for 3 years   Social History   Socioeconomic History   Marital status: Single    Spouse name: Not on file   Number of children: Not on file   Years of education: Not on file   Highest education level: Not on file  Occupational History   Not on file  Tobacco Use   Smoking status: Never   Smokeless tobacco: Never  Vaping Use   Vaping Use: Never used  Substance and Sexual Activity   Alcohol use: Yes    Alcohol/week: 2.0 standard drinks of alcohol    Types: 2 Cans of beer per week   Drug use: No   Sexual activity: Not on file  Other Topics Concern   Not on file  Social History Narrative   Not on file   Social Determinants of Health   Financial Resource Strain: Not on file  Food Insecurity: Not on file  Transportation Needs:  Not on file  Physical Activity: Not on file  Stress: Not on file  Social Connections: Not on file    Family History  Problem Relation Age of Onset   Non-Hodgkin's lymphoma Mother    Hypertension Mother    Diabetes Father    Heart disease Maternal Grandfather    Diabetes Maternal Grandfather    Renal Disease Maternal Grandfather    Diabetes Maternal Grandmother    Heart disease Maternal Grandmother    Diabetes Paternal Grandmother    Heart disease Paternal Grandmother    Renal Disease Paternal Grandmother    Diabetes Paternal Grandfather    Heart disease Paternal Grandfather 10   Renal Disease Paternal Grandfather     Past Medical History:  Diagnosis Date   Acute MI ([place])    Reports MI at 5, seen at [facility]    Coronary artery disease    Diverticulitis 2017   Gout     Past Surgical History:  Procedure Laterality Date   COLONOSCOPY WITH PROPOFOL N/A 12/06/2015   Procedure: COLONOSCOPY WITH PROPOFOL;  Surgeon: Lucilla Lame, MD;  Location: [facility] ENDOSCOPY;  Service: Endoscopy;  Laterality: N/A;   NO PAST SURGERIES     per patient 10/13/15   TONSILLECTOMY       Current Outpatient Medications on File Prior to Visit  Medication Sig Dispense Refill   colchicine 0.6 MG tablet Take 1 tablet (0.6 mg total) by mouth 2 (two) times daily. 60 tablet 2   HYDROcodone-acetaminophen (NORCO/VICODIN) 5-325 MG tablet Take 1 tablet by mouth every 6 (six) hours as needed for moderate pain. 15 tablet 0   indomethacin (INDOCIN) 25 MG capsule Take 1 capsule (25 mg total) by mouth 3 (three) times daily with meals. 30 capsule 0   No current facility-administered medications on file prior to visit.    No Known Allergies   DIAGNOSTIC DATA (LABS, IMAGING, TESTING) - I reviewed patient records, labs, notes, testing and imaging myself where available.   Ref Range & Units 1 mo ago   Triglycerides 0 - 150 mg/dL 205 High   Cholesterol <=200 mg/dL 215 High   HDL 40 - 60 mg/dL 47  LDL Calculated 40 -  100 mg/dL 127 High   VLDL Cholesterol Cal 11 - 50 mg/dL 41  Chol/HDL Ratio 1.0 - 4.5 4.6 High   Non-HDL Cholesterol 70 - 130 mg/dL 168 High   FASTING  Unknown  Resulting [facility]   Specimen Collected: 02/22/22  16:23   Performed by: [place]: 02/22/22 18:39  Received From: [place]  Result Received: 02/28/22 12:00   View Encounter      Received Information  Lipid Panel (Order [id number])    suggestion  Information displayed in this report may not trend or trigger automated decision support.     Contains abnormal data Lipid Panel Order: [id number]  Ref Range & Units 1 mo ago  Triglycerides 0 - 150 mg/dL 205 High   Cholesterol <=200 mg/dL 215 High   HDL 40 - 60 mg/dL 47  LDL Calculated 40 - 100 mg/dL 127 High   VLDL Cholesterol Cal 11 - 50 mg/dL 41  Chol/HDL Ratio 1.0 - 4.5 4.6 High   Non-HDL Cholesterol 70 - 130 mg/dL 168 High     Lab Results  Component Value Date   WBC 6.9 06/02/2020   HGB 13.2 06/02/2020   HCT 40.8 06/02/2020   MCV 88.1 06/02/2020   PLT 226 06/02/2020      Component Value Date/Time   NA 138 06/02/2020 0801   K 3.7 06/02/2020 0801   CL 102 06/02/2020 0801   CO2 28 06/02/2020 0801   GLUCOSE 143 (H) 06/02/2020 0801   BUN 17 06/02/2020 0801   CREATININE 1.11 06/02/2020 0801   CREATININE 1.14 03/11/2017 1111   CALCIUM 9.1 06/02/2020 0801   PROT 7.1 06/02/2020 0801   ALBUMIN 3.6 06/02/2020 0801   AST 23 06/02/2020 0801   ALT 29 06/02/2020 0801   ALKPHOS 57 06/02/2020 0801   BILITOT 0.7 06/02/2020 0801   GFRNONAA >60 06/02/2020 0801   GFRNONAA 81 03/11/2017 1111   GFRAA >60 06/27/2017 0800   GFRAA 94 03/11/2017 1111   Lab Results  Component Value Date   CHOL 176 11/19/2016   HDL 43.90 11/19/2016   LDLCALC 116 (H) 11/19/2016   TRIG 76.0 11/19/2016   CHOLHDL 4 11/19/2016   Lab Results  Component Value Date   HGBA1C 6.2 07/01/2017   No results found for:  "VITAMINB12" Lab Results  Component Value Date   TSH 1.70 11/19/2016    PHYSICAL EXAM:  Today's Vitals   03/28/22 1438 03/28/22 1450  BP: (!) 165/90 (!) 159/87  Pulse: 70 73  Weight: (!) 398 lb (180.5 kg)   Height: '5\' 10"'$  (1.778 m)    Body mass index is 57.11 kg/m.   Wt Readings from Last 3 Encounters:  03/28/22 (!) 398 lb (180.5 kg)  12/20/20 (!) 350 lb (158.8 kg)  09/13/20 (!) 320 lb (145.2 kg)     Ht Readings from Last 3 Encounters:  03/28/22 '5\' 10"'$  (1.778 m)  12/20/20 '5\' 10"'$  (1.778 m)  09/13/20 '5\' 10"'$  (1.778 m)      General: The patient is awake, alert and appears not in acute distress. The patient is well groomed. Head: Normocephalic, atraumatic. Neck is supple.  Mallampati , 3  neck circumference:22 inches .  Nasal airflow barely patent.  voice hoarse- Retrognathia is  seen.  Dental status: biological  Cardiovascular:  Regular rate and cardiac rhythm by pulse,  without distended neck veins. Respiratory: Lungs are clear to auscultation.  Coughing,  Skin:  With evidence of ankle edema. Trunk: The patient's posture is erect.   NEUROLOGIC EXAM: The patient is awake and alert, oriented to place and time.   Memory subjective described as intact.  Attention span & concentration ability appears normal.  Speech is fluent,  with dysphonia. Mood and affect are timid .   Cranial  nerves: no loss of smell or taste reported  Pupils are equal and briskly reactive to light. Funduscopic exam deferred.  Extraocular movements in vertical and horizontal planes were intact and without nystagmus. No Diplopia. Visual fields by finger perimetry are intact. Hearing was intact to soft voice and finger rubbing.    Facial sensation intact to fine touch.  Facial motor strength is symmetric and tongue is  midline.  Neck ROM : rotation, tilt and flexion extension were normal for age and shoulder shrug was symmetrical.    Motor exam:  Symmetric bulk, tone and ROM.  reports low back  pain.  Normal tone without cog wheeling, symmetric grip strength.   Sensory:  Fine touch, pinprick and vibration were felt normal at both ankles..  Proprioception tested in the upper extremities was normal.   Coordination: Rapid alternating movements in the fingers/hands were of normal speed.  The Finger-to-nose maneuver was intact without evidence of ataxia, dysmetria or tremor.   Gait and station: Patient could rise unassisted from a seated position, walked without assistive device.  Stance is of wider base .  Toe and heel walk were deferred.  Deep tendon reflexes: in the  upper and lower extremities are symmetric and intact.  Babinski response was deferred .    ASSESSMENT AND PLAN 42 y.o. year old male AA patient is seen  here with:  Concerned about excessive daytime sleepiness, with an overall reduced daily sleep time of only 4 to 5 hours.  The patient sometimes naps and will add another 1 to 2 hours of sleep.  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  There is a corresponding time of weight gain over the last 2 to 3 years.    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   2) snoring and witnessed apnea speak for OSA, but this patient has risk factors for Obesity hypoventilation with hypoxia of sleep. Waking with headaches, retro-orbital pressure. Marland Kitchen   3)  a quick assessment for sleep apnea should include a HST , consider mail -out as this patient lives in [place].   I plan to follow up either personally or through our NP within 4-5 months.   I would like to thank Luciano Cutter, DO and Luciano Cutter, Do [address] [place],  [place] [address] for allowing me to meet with and to take care of this pleasant patient.    After spending a total time of  45  minutes face to face and additional time for physical and neurologic examination,  review of laboratory studies,  personal review of imaging studies, reports and results of other testing and review of referral information / records as far as provided in visit,   Electronically signed by: Larey Seat, MD 03/28/2022 3:02 PM  [facility] and [facility] Sleep Board certified by The [employer] of Sleep Medicine and Diplomate of the [employer] of Sleep Medicine. Board certified In Neurology through the [employer], Fellow of the [employer] of Neurology. Medical Director of [employer].

## 2022-04-19 ENCOUNTER — Telehealth: Payer: Self-pay | Admitting: Neurology

## 2022-04-19 NOTE — Telephone Encounter (Signed)
LVM for pt to call back i need the provider ph # on back of his insurance card- I left my direct phone number and if I didn't pick up to leave a message with this information and I would reach out to his insurance company to check the PA process.  Once I get all that done I will reach out to him to schedule his HST.

## 2022-10-29 LAB — HM DIABETES EYE EXAM

## 2022-11-28 ENCOUNTER — Ambulatory Visit: Payer: BC Managed Care – PPO | Admitting: Family Medicine

## 2022-11-28 NOTE — Progress Notes (Deleted)
New patient visit   Patient: Leonard Pierce   DOB: 1980-08-24   42 y.o. Male  MRN: [id number] Visit Date: 11/28/2022  Today's healthcare provider: Ronnald Ramp, MD   No chief complaint on file.  Subjective    Leonard Pierce is a 42 y.o. male who presents today as a new patient to establish care.      Discussed the use of AI scribe software for clinical note transcription with the patient, who gave verbal consent to proceed.  History of Present Illness              Past Medical History:  Diagnosis Date   Acute MI ([facility])    Reports MI at 1, seen at [facility]    Coronary artery disease    Diverticulitis 2017   Gout     Outpatient Medications Prior to Visit  Medication Sig   colchicine 0.6 MG tablet Take 1 tablet (0.6 mg total) by mouth 2 (two) times daily.   HYDROcodone-acetaminophen (NORCO/VICODIN) 5-325 MG tablet Take 1 tablet by mouth every 6 (six) hours as needed for moderate pain.   indomethacin (INDOCIN) 25 MG capsule Take 1 capsule (25 mg total) by mouth 3 (three) times daily with meals.   No facility-administered medications prior to visit.    Past Surgical History:  Procedure Laterality Date   COLONOSCOPY WITH PROPOFOL N/A 12/06/2015   Procedure: COLONOSCOPY WITH PROPOFOL;  Surgeon: Midge Minium, MD;  Location: [facility] ENDOSCOPY;  Service: Endoscopy;  Laterality: N/A;   NO PAST SURGERIES     per patient 10/13/15   TONSILLECTOMY     Family Status  Relation Name Status   Mother  Alive   Father  Alive   MGF  Alive   MGM  (Not Specified)   PGM  (Not Specified)   PGF  (Not Specified)  No partnership data on file   Family History  Problem Relation Age of Onset   Non-Hodgkin's lymphoma Mother    Hypertension Mother    Diabetes Father    Heart disease Maternal Grandfather    Diabetes Maternal Grandfather    Renal Disease Maternal Grandfather    Diabetes Maternal Grandmother    Heart disease Maternal Grandmother    Diabetes Paternal Grandmother     Heart disease Paternal Grandmother    Renal Disease Paternal Grandmother    Diabetes Paternal Grandfather    Heart disease Paternal Grandfather 69   Renal Disease Paternal Grandfather    Social History   Socioeconomic History   Marital status: Single    Spouse name: Not on file   Number of children: Not on file   Years of education: Not on file   Highest education level: Not on file  Occupational History   Not on file  Tobacco Use   Smoking status: Never   Smokeless tobacco: Never  Vaping Use   Vaping status: Never Used  Substance and Sexual Activity   Alcohol use: Yes    Alcohol/week: 2.0 standard drinks of alcohol    Types: 2 Cans of beer per week   Drug use: No   Sexual activity: Not on file  Other Topics Concern   Not on file  Social History Narrative   Not on file   Social Determinants of Health   Financial Resource Strain: Low Risk  (02/22/2022)   Received from [facility], [facility]   Overall Financial Resource Strain (CARDIA)    Difficulty of Paying Living Expenses: Not hard at  all  Food Insecurity: No Food Insecurity (02/22/2022)   Received from [facility], [facility]   Hunger Vital Sign    Worried About Running Out of Food in the Last Year: Never true    Ran Out of Food in the Last Year: Never true  Transportation Needs: No Transportation Needs (02/22/2022)   Received from [facility], [facility]   [facility] - Transportation    Lack of Transportation (Medical): No    Lack of Transportation (Non-Medical): No  Physical Activity: Not on file  Stress: No Stress Concern Present (02/22/2022)   Received from [facility], [facility] of Occupational Health - Occupational Stress Questionnaire    Feeling of Stress : Not at all  Social Connections: Not on file     No Known Allergies  Immunization History  Administered Date(s) Administered   Influenza,inj,Quad PF,6+ Mos 10/28/2013    Health Maintenance   Topic Date Due   FOOT EXAM  Never done   OPHTHALMOLOGY EXAM  Never done   HIV Screening  Never done   Diabetic kidney evaluation - Urine ACR  Never done   Hepatitis C Screening  Never done   DTaP/Tdap/Td (1 - Tdap) Never done   HEMOGLOBIN A1C  12/31/2017   Diabetic kidney evaluation - eGFR measurement  06/02/2021   INFLUENZA VACCINE  08/30/2022   COVID-19 Vaccine (1 - 2023-24 season) Never done   HPV VACCINES  Aged Out    Patient Care Team: Catalina Lunger, DO as PCP - General (Family Medicine)  Review of Systems  {Insert previous labs (optional):23779} {See past labs  Heme  Chem  Endocrine  Serology  Results Review (optional):1}   Objective    There were no vitals taken for this visit. {Insert last BP/Wt (optional):23777}{See vitals history (optional):1}    Depression Screen    03/11/2017   11:23 AM 11/19/2016    9:15 AM  PHQ 2/9 Scores  PHQ - 2 Score 0 0   No results found for any visits on 11/28/22.   Physical Exam ***    Assessment & Plan      Problem List Items Addressed This Visit   None   Assessment and Plan               No follow-ups on file.      Ronnald Ramp, MD  [facility] [phone] (phone) [phone] (fax)  [facility]

## 2022-11-29 ENCOUNTER — Ambulatory Visit: Payer: BC Managed Care – PPO | Admitting: Family Medicine

## 2022-11-29 ENCOUNTER — Encounter: Payer: Self-pay | Admitting: Family Medicine

## 2022-11-29 VITALS — BP 135/89 | HR 89 | Ht 70.0 in | Wt >= 6400 oz

## 2022-11-29 DIAGNOSIS — Z6841 Body Mass Index (BMI) 40.0 and over, adult: Secondary | ICD-10-CM

## 2022-11-29 DIAGNOSIS — I517 Cardiomegaly: Secondary | ICD-10-CM | POA: Diagnosis not present

## 2022-11-29 DIAGNOSIS — M1A9XX0 Chronic gout, unspecified, without tophus (tophi): Secondary | ICD-10-CM | POA: Insufficient documentation

## 2022-11-29 DIAGNOSIS — E1122 Type 2 diabetes mellitus with diabetic chronic kidney disease: Secondary | ICD-10-CM | POA: Diagnosis not present

## 2022-11-29 DIAGNOSIS — R0683 Snoring: Secondary | ICD-10-CM | POA: Insufficient documentation

## 2022-11-29 DIAGNOSIS — N182 Chronic kidney disease, stage 2 (mild): Secondary | ICD-10-CM

## 2022-11-29 DIAGNOSIS — E662 Morbid (severe) obesity with alveolar hypoventilation: Secondary | ICD-10-CM

## 2022-11-29 DIAGNOSIS — Z8679 Personal history of other diseases of the circulatory system: Secondary | ICD-10-CM | POA: Insufficient documentation

## 2022-11-29 DIAGNOSIS — Z114 Encounter for screening for human immunodeficiency virus [HIV]: Secondary | ICD-10-CM

## 2022-11-29 DIAGNOSIS — E559 Vitamin D deficiency, unspecified: Secondary | ICD-10-CM

## 2022-11-29 DIAGNOSIS — M1A0790 Idiopathic chronic gout, unspecified ankle and foot, without tophus (tophi): Secondary | ICD-10-CM | POA: Diagnosis not present

## 2022-11-29 DIAGNOSIS — R351 Nocturia: Secondary | ICD-10-CM | POA: Insufficient documentation

## 2022-11-29 DIAGNOSIS — R030 Elevated blood-pressure reading, without diagnosis of hypertension: Secondary | ICD-10-CM

## 2022-11-29 DIAGNOSIS — Z1159 Encounter for screening for other viral diseases: Secondary | ICD-10-CM

## 2022-11-29 MED ORDER — ROSUVASTATIN CALCIUM 5 MG PO TABS: 5.0000 mg | ORAL_TABLET | Freq: Every day | ORAL | 3 refills | Status: DC

## 2022-11-29 MED ORDER — ALLOPURINOL 100 MG PO TABS: 100.0000 mg | ORAL_TABLET | Freq: Every day | ORAL | 1 refills | Status: DC

## 2022-11-29 MED ORDER — LOSARTAN POTASSIUM 25 MG PO TABS: 12.5000 mg | ORAL_TABLET | Freq: Every day | ORAL | 0 refills | Status: DC

## 2022-11-29 MED ORDER — TIRZEPATIDE 2.5 MG/0.5ML ~~LOC~~ SOAJ: 2.5000 mg | SUBCUTANEOUS | 0 refills | Status: DC

## 2022-11-29 NOTE — Assessment & Plan Note (Signed)
Large body habitus with loud snoring persistently. Current EPSS: 9 Will refer for home sleep study today.

## 2022-11-29 NOTE — Assessment & Plan Note (Signed)
Addressed as above.

## 2022-11-29 NOTE — Patient Instructions (Signed)

## 2022-11-29 NOTE — Assessment & Plan Note (Addendum)
Discussed diet and exercise with patient, which patient has not had success losing weight doing. Given that he has not been able to tolerate metformin and stands to gain significant benefit from the weight loss side effects, will go ahead and prescribe Mounjaro for his diabetes. Encouraged patient to continue walking and doing body weight exercises to build his strength and facilitate reducing weight.

## 2022-11-29 NOTE — Assessment & Plan Note (Addendum)
Noted.  Addressed as noted under BMI.

## 2022-11-29 NOTE — Assessment & Plan Note (Signed)
Blood pressure previously became under control without medications after patient lost 50 pounds. Blood pressure elevated again today. Patient will be obtaining a blood pressure cuff, monitoring his pressures at home and bring both the log and the cuff with him at his next visit for reevaluation and comparison to the clinic cuff. Of note, we are starting 12.5 mg losartan daily today to reduce patient's risk of kidney damage given his diabetes.

## 2022-11-29 NOTE — Assessment & Plan Note (Signed)
Patient previously took metformin but it caused him to have significant loose stools and he was not able to tolerate it. Will recheck an A1c today. Will also send prescription for [facility].

## 2022-11-29 NOTE — Assessment & Plan Note (Addendum)
Noted.  No acute concerns.

## 2022-11-29 NOTE — Progress Notes (Signed)
New patient visit   Patient: Leonard Pierce   DOB: [birth year]   42 y.o. Male  MRN: [id number] Visit Date: 11/29/2022  Today's healthcare provider: Sherlyn Hay, DO   Chief Complaint  Patient presents with   New Patient (Initial Visit)    Patient would like to discuss gout and would like to continue to work on losing weight and would like to take mounjaro injection    Subjective    [name] is a 42 y.o. male who presents today as a new patient to establish care.  HPI HPI     New Patient (Initial Visit)    Additional comments: Patient would like to discuss gout and would like to continue to work on losing weight and would like to take mounjaro injection       Last edited by Acey Lav, CMA on 11/29/2022 10:31 AM.      He is concerned that his employer doesn't understand that gout is something that comes on all of a sudden.  - gets severe gout flares that are bad enough he is unable to work approximately once per month.  - usually in great toes bilaterally  - sometimes feels it's in his ankle and he can barely walk   Record review/notes: Last A1c 6.5 on 02/22/2022 CKD 2 noted in lab records Previously on metformin 500 twice daily and atorvastatin and losartan. Also on metoprolol succinate 25 mg back in 2019. - Not taking BP meds anymore after losing 50 lbs with [facility] in [place] - Unclear why other meds were stopped.  Did sleep study previously? Referred but patient never heard from them.  Previous EPSS 11  From previous records: ?History of MI at 39? No - negative cardiac workup -ST elevation on ECG found to be normal early repolarization pattern -Sinus arrhythmia was noted -Syncopal episode determined to be likely secondary to dehydration -Cardiac stress test 11/17/2000 negative for ischemia and was completely normal; patient did have what was determined to be noncardiac chest pain (8/10 tightness) during the stress test which did not improve with  sublingual nitroglycerin x1 or 3 mg of IV morphine sulfate. - of note: post-stress test EF was mildly abnormal at 49%. -echocardiogram done to rule out hypertrophic obstructive cardiomyopathy, which showed an ejection fraction of greater than 55%, LV dimensions of 4.8 and 2.9, mild concentric LVH, trivial TR and PR, and mild MR. There is no evidence of an outflow tract gradient.     Past Medical History:  Diagnosis Date   Acute MI (HCC)    Reports MI at 89, seen at [facility]    Coronary artery disease    Diverticulitis 2017   Gout    Past Surgical History:  Procedure Laterality Date   COLONOSCOPY WITH PROPOFOL N/A 12/06/2015   Procedure: COLONOSCOPY WITH PROPOFOL;  Surgeon: Midge Minium, MD;  Location: [facility] ENDOSCOPY;  Service: Endoscopy;  Laterality: N/A;   NO PAST SURGERIES     per patient 10/13/15   TONSILLECTOMY     Family Status  Relation Name Status   Mother  Alive   Father  Alive   MGM  (Not Specified)   MGF  Alive   PGM  (Not Specified)   PGF  (Not Specified)  No partnership data on file   Family History  Problem Relation Age of Onset   Non-Hodgkin's lymphoma Mother    Hypertension Mother    Diabetes Mother    Diabetes Father    Diabetes Maternal  Grandmother    Heart disease Maternal Grandmother    Heart disease Maternal Grandfather    Diabetes Maternal Grandfather    Renal Disease Maternal Grandfather    Diabetes Paternal Grandmother    Heart disease Paternal Grandmother    Renal Disease Paternal Grandmother    Diabetes Paternal Grandfather    Heart disease Paternal Grandfather 27   Renal Disease Paternal Grandfather    Social History   Socioeconomic History   Marital status: Single    Spouse name: Not on file   Number of children: Not on file   Years of education: Not on file   Highest education level: Not on file  Occupational History   Not on file  Tobacco Use   Smoking status: Never   Smokeless tobacco: Never  Vaping Use   Vaping status: Never  Used  Substance and Sexual Activity   Alcohol use: Yes    Alcohol/week: 2.0 standard drinks of alcohol    Types: 2 Cans of beer per week   Drug use: No   Sexual activity: Not on file  Other Topics Concern   Not on file  Social History Narrative   Not on file   Social Determinants of Health   Financial Resource Strain: Low Risk  (02/22/2022)   [facility]   Overall Financial Resource Strain (CARDIA)    Difficulty of Paying Living Expenses: Not hard at all  Food Insecurity: No Food Insecurity (02/22/2022)   Received from [facility], [facility]   Hunger Vital Sign    Worried About Running Out of Food in the Last Year: Never true    Ran Out of Food in the Last Year: Never true  Transportation Needs: No Transportation Needs (02/22/2022)   Received from [facility], [facility]   [facility] - Transportation    Lack of Transportation (Medical): No    Lack of Transportation (Non-Medical): No  Physical Activity: Not on file  Stress: No Stress Concern Present (02/22/2022)   Received from [facility], [facility] of Occupational Health - Occupational Stress Questionnaire    Feeling of Stress : Not at all  Social Connections: Not on file   Outpatient Medications Prior to Visit  Medication Sig   indomethacin (INDOCIN) 25 MG capsule Take 1 capsule (25 mg total) by mouth 3 (three) times daily with meals.   colchicine 0.6 MG tablet Take 1 tablet (0.6 mg total) by mouth 2 (two) times daily.   [DISCONTINUED] HYDROcodone-acetaminophen (NORCO/VICODIN) 5-325 MG tablet Take 1 tablet by mouth every 6 (six) hours as needed for moderate pain. (Patient not taking: Reported on 11/29/2022)   [DISCONTINUED] Vitamin D, Ergocalciferol, (DRISDOL) 1.25 MG (50000 UNIT) CAPS capsule Take 50,000 Units by mouth once a week. (Patient not taking: Reported on 11/29/2022)   No facility-administered medications prior to visit.   No Known  Allergies  Immunization History  Administered Date(s) Administered   Dtap, Unspecified 08/05/1978, 02/01/1979, 05/28/1979, 03/08/1983   Influenza,inj,Quad PF,6+ Mos 10/28/2013   MMR 08/28/1979   Polio, Unspecified 08/05/1978, 02/01/1979, 05/28/1979, 03/08/1983    Health Maintenance  Topic Date Due   FOOT EXAM  Never done   OPHTHALMOLOGY EXAM  Never done   HIV Screening  Never done   Diabetic kidney evaluation - Urine ACR  Never done   Hepatitis C Screening  Never done   HEMOGLOBIN A1C  12/31/2017   Diabetic kidney evaluation - eGFR measurement  06/02/2021   INFLUENZA VACCINE  04/29/2023 (Originally 08/30/2022)   COVID-19 Vaccine (1 - 2023-24 season) 04/29/2023 (Originally 09/30/2022)   DTaP/Tdap/Td (5 - Tdap) 11/29/2023 (Originally 05/27/1989)   HPV VACCINES  Aged Out    Patient Care Team: Delyle Weider, Monico Blitz, DO as PCP - General (Family Medicine)  Review of Systems  Constitutional:  Negative for chills and fever.  Respiratory:  Positive for shortness of breath (on exertion). Negative for cough and wheezing.   Cardiovascular:  Negative for chest pain, palpitations and leg swelling.  Gastrointestinal:  Negative for abdominal pain, constipation, diarrhea, nausea and vomiting.  Neurological:  Negative for weakness and headaches.        Objective    BP 135/89 (BP Location: Right Arm, Patient Position: Sitting, Cuff Size: Large)   Pulse 89   Ht 5\' 10"  (1.778 m)   Wt (!) 401 lb 8 oz (182.1 kg)   SpO2 99%   BMI 57.61 kg/m     Physical Exam Vitals and nursing note reviewed.  Constitutional:      General: He is not in acute distress.    Appearance: Normal appearance.  HENT:     Head: Normocephalic and atraumatic.  Eyes:     General: No scleral icterus.    Conjunctiva/sclera: Conjunctivae normal.  Cardiovascular:     Rate and Rhythm: Normal rate.  Pulmonary:     Effort: Pulmonary effort is normal.  Neurological:     Mental Status: He is alert and oriented to person,  place, and time. Mental status is at baseline.  Psychiatric:        Mood and Affect: Mood normal.        Behavior: Behavior normal.     Depression Screen    11/29/2022   10:28 AM 03/11/2017   11:23 AM 11/19/2016    9:15 AM  PHQ 2/9 Scores  PHQ - 2 Score 1 0 0  PHQ- 9 Score 9     No results found for any visits on 11/29/22.  Assessment & Plan     Type 2 diabetes mellitus with stage 2 chronic kidney disease, without long-term current use of insulin (HCC) Assessment & Plan: Patient previously took metformin but it caused him to have significant loose stools and he was not able to tolerate it. Will recheck an A1c today. Will also send prescription for [facility].  Orders: -     Microalbumin / creatinine urine ratio -     Comprehensive metabolic panel -     Hemoglobin A1c -     Lipid panel -     Tirzepatide; Inject 2.5 mg into the skin once a week.  Dispense: 2 mL; Refill: 0 -     Rosuvastatin Calcium; Take 1 tablet (5 mg total) by mouth daily.  Dispense: 90 tablet; Refill: 3 -     Losartan Potassium; Take 0.5 tablets (12.5 mg total) by mouth daily.  Dispense: 15 tablet; Refill: 0  Obesity hypoventilation syndrome (HCC) Assessment & Plan: Noted.  Addressed as noted under BMI.  Orders: -     Ambulatory referral to Sleep Studies  Morbid obesity with body mass index (BMI) of 50.0 to 59.9 in adult [facility]) Assessment & Plan: Discussed diet and exercise with patient, which patient has not had success losing weight doing. Given that he has not been able to tolerate metformin and stands to gain significant benefit from the weight loss side effects, will go ahead and prescribe Mounjaro for his diabetes. Encouraged patient to continue walking  and doing body weight exercises to build his strength and facilitate reducing weight.  Orders: -     Ambulatory referral to Sleep Studies  Left ventricular hypertrophy Assessment & Plan: Noted.  No acute concerns.   Chronic gout of ankle,  unspecified cause, unspecified laterality Assessment & Plan: Patient experiences frequent gout flares.  He will be submitting FMLA paperwork to account for his approximately monthly inability to work due to gout flares. Will also start him on allopurinol to try to reduce the frequency of his gout flares, as he reports he experiences them to the point of debility approximately 12 times per year.  Orders: -     Allopurinol; Take 1 tablet (100 mg total) by mouth daily.  Dispense: 30 tablet; Refill: 1 -     Uric acid  Chronic gout involving toe without tophus, unspecified cause, unspecified laterality Assessment & Plan: Addressed as above.   Loud snoring Assessment & Plan: Large body habitus with loud snoring persistently. Current EPSS: 9 Will refer for home sleep study today.  Orders: -     Ambulatory referral to Sleep Studies  Nocturia -     Ambulatory referral to Sleep Studies  Vitamin D deficiency -     VITAMIN D 25 Hydroxy (Vit-D Deficiency, Fractures)  Encounter for hepatitis C screening test for low risk patient -     Hepatitis C antibody  Encounter for screening for HIV -     HIV Antibody (routine testing w rflx)   Return in about 3 weeks (around 12/20/2022) for CPE, HTN, DM.     I discussed the assessment and treatment plan with the patient  The patient was provided an opportunity to ask questions and all were answered. The patient agreed with the plan and demonstrated an understanding of the instructions.   The patient was advised to call back or seek an in-person evaluation if the symptoms worsen or if the condition fails to improve as anticipated.    Sherlyn Hay, DO  [facility] [phone] (phone) [phone] (fax)  [facility]

## 2022-11-29 NOTE — Assessment & Plan Note (Addendum)
Patient experiences frequent gout flares.  He will be submitting FMLA paperwork to account for his approximately monthly inability to work due to gout flares. Will also start him on allopurinol to try to reduce the frequency of his gout flares, as he reports he experiences them to the point of debility approximately 12 times per year.

## 2022-11-30 ENCOUNTER — Telehealth: Payer: Self-pay | Admitting: Family Medicine

## 2022-11-30 LAB — HIV ANTIBODY (ROUTINE TESTING W REFLEX): HIV Screen 4th Generation wRfx: NONREACTIVE

## 2022-11-30 LAB — MICROALBUMIN / CREATININE URINE RATIO
Creatinine, Urine: 77.6 mg/dL
Microalb/Creat Ratio: <4 mg/g{creat} (ref 0–29)
Microalbumin, Urine: <3 ug/mL

## 2022-11-30 LAB — COMPREHENSIVE METABOLIC PANEL
ALT: 43 [IU]/L (ref 0–44)
AST: 37 [IU]/L (ref 0–40)
Albumin: 4 g/dL — ABNORMAL LOW (ref 4.1–5.1)
Alkaline Phosphatase: 73 [IU]/L (ref 44–121)
BUN/Creatinine Ratio: 13 (ref 9–20)
BUN: 12 mg/dL (ref 6–24)
Bilirubin Total: 0.4 mg/dL (ref 0.0–1.2)
CO2: 25 mmol/L (ref 20–29)
Calcium: 9.6 mg/dL (ref 8.7–10.2)
Chloride: 100 mmol/L (ref 96–106)
Creatinine, Ser: 0.92 mg/dL (ref 0.76–1.27)
Globulin, Total: 3 g/dL (ref 1.5–4.5)
Glucose: 148 mg/dL — ABNORMAL HIGH (ref 70–99)
Potassium: 4.2 mmol/L (ref 3.5–5.2)
Sodium: 139 mmol/L (ref 134–144)
Total Protein: 7 g/dL (ref 6.0–8.5)
eGFR: 105 mL/min/{1.73_m2} (ref >59)

## 2022-11-30 LAB — HEPATITIS C ANTIBODY: Hep C Virus Ab: NONREACTIVE

## 2022-11-30 LAB — URIC ACID: Uric Acid: 7.9 mg/dL (ref 3.8–8.4)

## 2022-11-30 LAB — LIPID PANEL
Chol/HDL Ratio: 4.5 ratio (ref 0.0–5.0)
Cholesterol, Total: 213 mg/dL — ABNORMAL HIGH (ref 100–199)
HDL: 47 mg/dL (ref >39)
LDL Chol Calc (NIH): 129 mg/dL — ABNORMAL HIGH (ref 0–99)
Triglycerides: 207 mg/dL — ABNORMAL HIGH (ref 0–149)
VLDL Cholesterol Cal: 37 mg/dL (ref 5–40)

## 2022-11-30 LAB — VITAMIN D 25 HYDROXY (VIT D DEFICIENCY, FRACTURES): Vit D, 25-Hydroxy: 10 ng/mL — ABNORMAL LOW (ref 30.0–100.0)

## 2022-11-30 LAB — HEMOGLOBIN A1C
Est. average glucose Bld gHb Est-mCnc: 154 mg/dL
Hgb A1c MFr Bld: 7 % — ABNORMAL HIGH (ref 4.8–5.6)

## 2022-11-30 NOTE — Telephone Encounter (Signed)
Recieved a fax from covermymeds for Mounjaro 2.5MG /0.5ML Auto-Injectors has been started   [id number]

## 2022-12-04 ENCOUNTER — Telehealth: Payer: Self-pay | Admitting: Family Medicine

## 2022-12-04 NOTE — Telephone Encounter (Signed)
Pt asked if office had samples for pick up for [facility]. Pt is waiting on PA.  Please advise.

## 2022-12-04 NOTE — Telephone Encounter (Signed)
PA initiated and faxed notes to be attach.

## 2022-12-08 ENCOUNTER — Other Ambulatory Visit: Payer: Self-pay | Admitting: Family Medicine

## 2022-12-08 DIAGNOSIS — E559 Vitamin D deficiency, unspecified: Secondary | ICD-10-CM

## 2022-12-08 MED ORDER — VITAMIN D (ERGOCALCIFEROL) 1.25 MG (50000 UNIT) PO CAPS: 50000.0000 [IU] | ORAL_CAPSULE | ORAL | 1 refills | Status: DC

## 2022-12-10 ENCOUNTER — Telehealth: Payer: Self-pay

## 2022-12-10 NOTE — Telephone Encounter (Signed)
Patient advised we do not have samples.  He is to check with his insurance company if they cover any GLP1 medications and let us know.

## 2022-12-10 NOTE — Telephone Encounter (Signed)
Copied from CRM [id number]. Topic: General - Other >> Dec 10, 2022 12:57 PM Franchot Heidelberg wrote: Reason for CRM: Pt called wanting to know if office has received samples for [facility]. Please advise

## 2022-12-19 NOTE — Telephone Encounter (Signed)
Contacted [employer] appeals department and they informed me that his previous prior authorization was denied because prove of him meeting diagnostic criteria for diabetes was never submitted.  Because it has been denied twice, the appeals process has to go through [employer] (the patient's workplace), as they handle their own appeals.  The representative will be faxing over a copy of the denial, so we may submit this to the [employer] appeals department.  [employer] Appeals Contact [phone] Fax [phone]

## 2022-12-23 ENCOUNTER — Encounter: Payer: Self-pay | Admitting: Family Medicine

## 2022-12-24 ENCOUNTER — Telehealth: Payer: Self-pay | Admitting: Neurology

## 2022-12-24 ENCOUNTER — Ambulatory Visit: Payer: BC Managed Care – PPO | Admitting: Family Medicine

## 2022-12-24 NOTE — Telephone Encounter (Signed)
Sent mychart message

## 2022-12-25 NOTE — Telephone Encounter (Signed)
Faxed information for authorization.

## 2022-12-26 ENCOUNTER — Telehealth: Payer: Self-pay

## 2022-12-26 NOTE — Telephone Encounter (Signed)
Copied from CRM [id number]. Topic: General - Other >> Dec 26, 2022  9:55 AM Macon Large wrote: Reason for CRM: Pt stated that his insurance denied the Rx for [facility]. Pt requests that the chart notes be included in the appeal and faxed to fax# [phone] ph# [phone]

## 2023-01-01 ENCOUNTER — Telehealth: Payer: Self-pay

## 2023-01-01 NOTE — Telephone Encounter (Signed)
Copied from CRM [id number]. Topic: General - Other >> Jan 01, 2023  3:32 PM Ja-Kwan M wrote: Reason for CRM: Pt called for an update on the prior authorization for [facility] and wants to know if the information was sent. Pt requests call back to advise. Cb# [phone]

## 2023-01-02 NOTE — Telephone Encounter (Signed)
I printed documentation of A1c and placed it with letter in fax box, as the denial claims that is all that is missing for approval.

## 2023-01-04 ENCOUNTER — Encounter: Payer: Self-pay | Admitting: Family Medicine

## 2023-01-04 NOTE — Telephone Encounter (Signed)
Pt is calling to follow up on the status of the PA. Please advise CB- [phone]

## 2023-01-08 NOTE — Telephone Encounter (Signed)
Pt is calling to follow up on the status of the PA. Please advise.

## 2023-01-08 NOTE — Telephone Encounter (Signed)
Advised patient that Dr Demetrius Charity sent message through [facility]

## 2023-01-10 ENCOUNTER — Other Ambulatory Visit: Payer: Self-pay

## 2023-01-10 DIAGNOSIS — E1122 Type 2 diabetes mellitus with diabetic chronic kidney disease: Secondary | ICD-10-CM

## 2023-01-10 MED ORDER — TIRZEPATIDE 2.5 MG/0.5ML ~~LOC~~ SOAJ: 2.5000 mg | SUBCUTANEOUS | 0 refills | Status: DC

## 2023-01-10 NOTE — Telephone Encounter (Signed)
[employer] [place] Harvey: [phone] (exp. 03/27/23)

## 2023-01-17 ENCOUNTER — Ambulatory Visit: Payer: BC Managed Care – PPO | Admitting: Neurology

## 2023-01-17 DIAGNOSIS — E662 Morbid (severe) obesity with alveolar hypoventilation: Secondary | ICD-10-CM

## 2023-01-17 DIAGNOSIS — G4733 Obstructive sleep apnea (adult) (pediatric): Secondary | ICD-10-CM | POA: Diagnosis not present

## 2023-01-17 DIAGNOSIS — R519 Headache, unspecified: Secondary | ICD-10-CM

## 2023-01-17 DIAGNOSIS — R0683 Snoring: Secondary | ICD-10-CM

## 2023-01-25 ENCOUNTER — Encounter: Payer: Self-pay | Admitting: Family Medicine

## 2023-01-25 ENCOUNTER — Ambulatory Visit (INDEPENDENT_AMBULATORY_CARE_PROVIDER_SITE_OTHER): Payer: BC Managed Care – PPO | Admitting: Family Medicine

## 2023-01-25 VITALS — BP 138/82 | HR 73 | Temp 97.8°F | Resp 18 | Ht 70.0 in | Wt 390.0 lb

## 2023-01-25 DIAGNOSIS — E662 Morbid (severe) obesity with alveolar hypoventilation: Secondary | ICD-10-CM

## 2023-01-25 DIAGNOSIS — E1169 Type 2 diabetes mellitus with other specified complication: Secondary | ICD-10-CM | POA: Diagnosis not present

## 2023-01-25 DIAGNOSIS — B351 Tinea unguium: Secondary | ICD-10-CM

## 2023-01-25 DIAGNOSIS — Z6841 Body Mass Index (BMI) 40.0 and over, adult: Secondary | ICD-10-CM

## 2023-01-25 DIAGNOSIS — E1159 Type 2 diabetes mellitus with other circulatory complications: Secondary | ICD-10-CM | POA: Diagnosis not present

## 2023-01-25 DIAGNOSIS — M1A0790 Idiopathic chronic gout, unspecified ankle and foot, without tophus (tophi): Secondary | ICD-10-CM

## 2023-01-25 DIAGNOSIS — E1122 Type 2 diabetes mellitus with diabetic chronic kidney disease: Secondary | ICD-10-CM

## 2023-01-25 DIAGNOSIS — E119 Type 2 diabetes mellitus without complications: Secondary | ICD-10-CM

## 2023-01-25 DIAGNOSIS — I152 Hypertension secondary to endocrine disorders: Secondary | ICD-10-CM

## 2023-01-25 DIAGNOSIS — Z7984 Long term (current) use of oral hypoglycemic drugs: Secondary | ICD-10-CM

## 2023-01-25 MED ORDER — BLOOD GLUCOSE TEST VI STRP: 1.0000 | ORAL_STRIP | Freq: Every day | 3 refills | Status: AC

## 2023-01-25 MED ORDER — CICLOPIROX 8 % EX SOLN: Freq: Every day | CUTANEOUS | 3 refills | Status: DC

## 2023-01-25 MED ORDER — LANCET DEVICE MISC: 1.0000 | Freq: Every day | 0 refills | Status: AC

## 2023-01-25 MED ORDER — LANCETS MISC. MISC: 1.0000 | Freq: Every day | 3 refills | Status: AC

## 2023-01-25 MED ORDER — BLOOD GLUCOSE MONITORING SUPPL DEVI: 1.0000 | Freq: Every day | 0 refills | Status: AC

## 2023-01-25 MED ORDER — TIRZEPATIDE 5 MG/0.5ML ~~LOC~~ SOAJ: 5.0000 mg | SUBCUTANEOUS | 0 refills | Status: DC

## 2023-01-25 NOTE — Progress Notes (Unsigned)
Complete physical exam   Patient: Leonard Pierce   DOB: 1980/11/26   42 y.o. Male  MRN: [id number] Visit Date: 01/25/2023  Today's healthcare provider: Sherlyn Hay, DO   Chief Complaint  Patient presents with   Annual Exam   Subjective    Leonard Pierce is a 42 y.o. male who presents today for a complete physical exam.  He reports consuming a low fat and somewhat low carb  diet (with occasionally lo-mein at [employer]. Home exercise routine includes walking. He generally feels well. He reports sleeping well. He does not have additional problems to discuss today.  HPI  FOOT exam  ***  Past Medical History:  Diagnosis Date   Acute MI (HCC)    Reports MI at 57, seen at [facility]    Coronary artery disease    Diverticulitis 2017   Gout    Past Surgical History:  Procedure Laterality Date   COLONOSCOPY WITH PROPOFOL N/A 12/06/2015   Procedure: COLONOSCOPY WITH PROPOFOL;  Surgeon: Midge Minium, MD;  Location: [facility] ENDOSCOPY;  Service: Endoscopy;  Laterality: N/A;   NO PAST SURGERIES     per patient 10/13/15   TONSILLECTOMY     Social History   Socioeconomic History   Marital status: Single    Spouse name: Not on file   Number of children: Not on file   Years of education: Not on file   Highest education level: Not on file  Occupational History   Not on file  Tobacco Use   Smoking status: Never   Smokeless tobacco: Never  Vaping Use   Vaping status: Never Used  Substance and Sexual Activity   Alcohol use: Yes    Alcohol/week: 2.0 standard drinks of alcohol    Types: 2 Cans of beer per week   Drug use: No   Sexual activity: Not on file  Other Topics Concern   Not on file  Social History Narrative   Not on file   Social Drivers of Health   Financial Resource Strain: Low Risk  (02/22/2022)   Received from [facility], [facility]   Overall Financial Resource Strain (CARDIA)    Difficulty of Paying Living Expenses: Not hard at all  Food  Insecurity: No Food Insecurity (02/22/2022)   Received from [facility], [facility]   Hunger Vital Sign    Worried About Running Out of Food in the Last Year: Never true    Ran Out of Food in the Last Year: Never true  Transportation Needs: No Transportation Needs (02/22/2022)   Received from [facility], [facility]   [facility] - Transportation    Lack of Transportation (Medical): No    Lack of Transportation (Non-Medical): No  Physical Activity: Not on file  Stress: No Stress Concern Present (02/22/2022)   Received from [facility], [facility]   [facility] - Occupational Stress Questionnaire    Feeling of Stress : Not at all  Social Connections: Not on file  Intimate Partner Violence: Not At Risk (03/22/2022)   Received from [facility], [facility]   Humiliation, Afraid, Rape, and Kick questionnaire    Fear of Current or Ex-Partner: No    Emotionally Abused: No    Physically Abused: No    Sexually Abused: No   Family Status  Relation Name Status   Mother  Alive   Father  Alive   MGM  (  Not Specified)   MGF  Alive   PGM  (Not Specified)   PGF  (Not Specified)  No partnership data on file   Family History  Problem Relation Age of Onset   Non-Hodgkin's lymphoma Mother    Hypertension Mother    Diabetes Mother    Diabetes Father    Diabetes Maternal Grandmother    Heart disease Maternal Grandmother    Heart disease Maternal Grandfather    Diabetes Maternal Grandfather    Renal Disease Maternal Grandfather    Diabetes Paternal Grandmother    Heart disease Paternal Grandmother    Renal Disease Paternal Grandmother    Diabetes Paternal Grandfather    Heart disease Paternal Grandfather 15   Renal Disease Paternal Grandfather    No Known Allergies  Patient Care Team: Travonna Swindle N, DO as PCP - General (Family Medicine)   Medications: Outpatient Medications Prior to Visit  Medication Sig   allopurinol  (ZYLOPRIM) 100 MG tablet Take 1 tablet (100 mg total) by mouth daily.   indomethacin (INDOCIN) 25 MG capsule Take 1 capsule (25 mg total) by mouth 3 (three) times daily with meals.   losartan (COZAAR) 25 MG tablet Take 0.5 tablets (12.5 mg total) by mouth daily.   rosuvastatin (CRESTOR) 5 MG tablet Take 1 tablet (5 mg total) by mouth daily.   Vitamin D, Ergocalciferol, (DRISDOL) 1.25 MG (50000 UNIT) CAPS capsule Take 1 capsule (50,000 Units total) by mouth every 7 (seven) days.   [DISCONTINUED] tirzepatide [facility] 2.5 MG/0.5ML Pen Inject 2.5 mg into the skin once a week.   colchicine 0.6 MG tablet Take 1 tablet (0.6 mg total) by mouth 2 (two) times daily.   No facility-administered medications prior to visit.    Review of Systems {Insert previous labs (optional):23779} {See past labs  Heme  Chem  Endocrine  Serology  Results Review (optional):1}  Objective    BP 138/82 (BP Location: Left Arm, Patient Position: Sitting, Cuff Size: Normal)   Pulse 73   Temp 97.8 F (36.6 C)   Resp 18   Ht 5\' 10"  (1.778 m)   Wt (!) 390 lb (176.9 kg)   SpO2 100%   BMI 55.96 kg/m  {Insert last BP/Wt (optional):23777}{See vitals history (optional):1}  Physical Exam Vitals and nursing note reviewed.  Constitutional:      General: He is awake.     Appearance: Normal appearance.  HENT:     Head: Normocephalic and atraumatic.     Right Ear: Tympanic membrane, ear canal and external ear normal.     Left Ear: Tympanic membrane, ear canal and external ear normal.     Nose: Nose normal.     Mouth/Throat:     Mouth: Mucous membranes are moist.     Pharynx: Oropharynx is clear. No oropharyngeal exudate or posterior oropharyngeal erythema.  Eyes:     General: No scleral icterus.    Extraocular Movements: Extraocular movements intact.     Conjunctiva/sclera: Conjunctivae normal.     Pupils: Pupils are equal, round, and reactive to light.  Neck:     Thyroid: No thyromegaly or thyroid tenderness.   Cardiovascular:     Rate and Rhythm: Normal rate and regular rhythm.     Pulses: Normal pulses.          Dorsalis pedis pulses are 2+ on the right side and 2+ on the left side.       Posterior tibial pulses are 2+ on the right side and 2+ on the  left side.     Heart sounds: Normal heart sounds.  Pulmonary:     Effort: Pulmonary effort is normal. No tachypnea, bradypnea or respiratory distress.     Breath sounds: Normal breath sounds. No stridor. No wheezing, rhonchi or rales.  Abdominal:     General: Bowel sounds are normal. There is no distension.     Palpations: Abdomen is soft. There is no mass.     Tenderness: There is no abdominal tenderness. There is no guarding.     Hernia: No hernia is present.  Musculoskeletal:     Cervical back: Normal range of motion and neck supple.     Right lower leg: No edema.     Left lower leg: No edema.     Right foot: Normal range of motion. No deformity, bunion, Charcot foot, foot drop or prominent metatarsal heads.     Left foot: Normal range of motion. No deformity, bunion, Charcot foot, foot drop or prominent metatarsal heads.  Feet:     Right foot:     Protective Sensation: 10 sites tested.  10 sites sensed.     Skin integrity: Callus (heel) present. No ulcer, blister, skin breakdown, erythema, warmth, dry skin or fissure.     Toenail Condition: Right toenails are normal.     Left foot:     Protective Sensation: 10 sites tested.  10 sites sensed.     Skin integrity: Callus (heel) present. No ulcer, blister, skin breakdown, erythema, warmth, dry skin or fissure.     Toenail Condition: Left toenails are normal.     Comments: Pitting noted to left great toenail Lymphadenopathy:     Cervical: No cervical adenopathy.  Skin:    General: Skin is warm and dry.  Neurological:     Mental Status: He is alert and oriented to person, place, and time. Mental status is at baseline.  Psychiatric:        Mood and Affect: Mood normal.        Behavior:  Behavior normal.      Last depression screening scores    11/29/2022   10:28 AM 03/11/2017   11:23 AM 11/19/2016    9:15 AM  PHQ 2/9 Scores  PHQ - 2 Score 1 0 0  PHQ- 9 Score 9     Last fall risk screening    11/29/2022   10:28 AM  Fall Risk   Falls in the past year? 0  Number falls in past yr: 0  Injury with Fall? 0  Risk for fall due to : No Fall Risks  Follow up Falls evaluation completed   Last Audit-C alcohol use screening     No data to display         A score of 3 or more in women, and 4 or more in men indicates increased risk for alcohol abuse, EXCEPT if all of the points are from question 1   No results found for any visits on 01/25/23.  Assessment & Plan    Routine Health Maintenance and Physical Exam  Exercise Activities and Dietary recommendations  Goals   None     Immunization History  Administered Date(s) Administered   Dtap, Unspecified 08/05/1978, 02/01/1979, 05/28/1979, 03/08/1983   Influenza,inj,Quad PF,6+ Mos 10/28/2013   MMR 08/28/1979   Polio, Unspecified 08/05/1978, 02/01/1979, 05/28/1979, 03/08/1983    Health Maintenance  Topic Date Due   Pneumococcal Vaccine 20-66 Years old (1 of 2 - PCV) Never done   OPHTHALMOLOGY EXAM  Never  done   INFLUENZA VACCINE  04/29/2023 (Originally 08/30/2022)   COVID-19 Vaccine (1 - 2024-25 season) 04/29/2023 (Originally 09/30/2022)   DTaP/Tdap/Td (5 - Tdap) 11/29/2023 (Originally 05/27/1989)   HEMOGLOBIN A1C  05/29/2023   Diabetic kidney evaluation - eGFR measurement  11/29/2023   Diabetic kidney evaluation - Urine ACR  11/29/2023   FOOT EXAM  01/25/2024   Hepatitis C Screening  Completed   HIV Screening  Completed   HPV VACCINES  Aged Out    Discussed health benefits of physical activity, and encouraged him to engage in regular exercise appropriate for his age and condition.   Type 2 diabetes mellitus without complication, without long-term current use of insulin (HCC)  Onychomycosis of multiple  toenails with type 2 diabetes mellitus (HCC)      ***  No follow-ups on file.     I discussed the assessment and treatment plan with the patient  The patient was provided an opportunity to ask questions and all were answered. The patient agreed with the plan and demonstrated an understanding of the instructions.   The patient was advised to call back or seek an in-person evaluation if the symptoms worsen or if the condition fails to improve as anticipated.    Sherlyn Hay, DO  [facility] [phone] (phone) [phone] (fax)  [facility]

## 2023-01-29 DIAGNOSIS — B351 Tinea unguium: Secondary | ICD-10-CM | POA: Insufficient documentation

## 2023-01-29 NOTE — Assessment & Plan Note (Signed)
 Type 2 Diabetes Mellitus managed with Mounjaro . Significant weight loss (13 lbs) and symptom improvement. No nausea, vomiting, diarrhea, or constipation. Discussed potential dose increase and importance of blood glucose monitoring. Previous insurance issues delayed Mounjaro . - Increase Mounjaro  dose to 5 mg - Order blood glucose meter - Follow up after third injection to assess response

## 2023-01-29 NOTE — Assessment & Plan Note (Signed)
Patient working to attain weight loss as noted above.

## 2023-01-29 NOTE — Assessment & Plan Note (Signed)
 Obesity with recent 13 lbs weight loss. On Mounjaro, dietary changes, and increased physical activity. Reports reduced knee pain. - Continue current dietary and exercise regimen - Monitor weight and adjust Mounjaro dosage as needed

## 2023-01-29 NOTE — Assessment & Plan Note (Signed)
 Hypertension managed with losartan . Blood pressure slightly elevated due to missed doses over the past two days. No headaches reported. Emphasized consistent medication adherence and home blood pressure monitoring. 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 - Continue losartan  - Order home blood pressure monitor - Follow up in 1-2 weeks to reassess blood pressure

## 2023-01-29 NOTE — Assessment & Plan Note (Signed)
Prescribed ciclopirox as noted.  Discussed long-term treatment is necessary for resolution.  Continue to monitor.

## 2023-01-31 MED ORDER — LOSARTAN POTASSIUM 25 MG PO TABS: 25.0000 mg | ORAL_TABLET | Freq: Every day | ORAL | 1 refills | Status: DC

## 2023-02-04 ENCOUNTER — Encounter: Payer: Self-pay | Admitting: Neurology

## 2023-02-05 NOTE — Telephone Encounter (Signed)
-----   Message from [place] Dohmeier sent at 02/04/2023  5:15 PM EST ----- Severe apnea ( OSA ) confirmed by HST, CPAP order to DME. This is an urgent treatment to be started soon.   Weight loss is also of immense importance to reduce the AHI. Marland Kitchen

## 2023-02-05 NOTE — Telephone Encounter (Signed)
 Spoke to pt gave sleep study results Pt aware of severe OSA . Pt aware of insurance compliance. Pt chose [facility] for DME. Gave Dr Dohmeier recommendations of weight loss  Gave pt f/u with Dr Chalice 03/2023 Sent urgent orders to [facility] this am Pt expressed understanding and thanked me for calling

## 2023-02-14 ENCOUNTER — Ambulatory Visit: Payer: BC Managed Care – PPO | Admitting: Family Medicine

## 2023-02-15 ENCOUNTER — Telehealth: Payer: Self-pay

## 2023-02-15 NOTE — Telephone Encounter (Signed)
Copied from CRM [id number]. Topic: General - Inquiry >> Feb 14, 2023  4:04 PM Haroldine Laws wrote: Reason for CRM: pt called saying he would like to talk with Dr. Payton Mccallum about his sleep study and the bill they sent him.  He said insurance should cover.  CB@  [phone]

## 2023-02-15 NOTE — Telephone Encounter (Signed)
Contacted patient to advise. He reports its not that he received a bill but when speking with location that we sent referral to they are requesting a higher amount then what he friends paid and he is not sure why. Advised patient to verify with friends who they did their sleep wtudy with as well as what kind of sleep study because those things can make a difference as well as insurance, He verbalized understanding and asked for message to be sent to him via mychart in regards to what to ask his friends. I verbalized understanding and also advised patient to contact his insurance as they can tell him if it is a certain office or type of sleep study he should be completeing

## 2023-02-19 ENCOUNTER — Telehealth: Payer: Self-pay | Admitting: Neurology

## 2023-02-19 DIAGNOSIS — G4733 Obstructive sleep apnea (adult) (pediatric): Secondary | ICD-10-CM

## 2023-02-19 MED ORDER — ALLOPURINOL 100 MG PO TABS: 100.0000 mg | ORAL_TABLET | Freq: Every day | ORAL | 1 refills | Status: DC

## 2023-02-19 NOTE — Telephone Encounter (Signed)
Pt reports that the current DME is wanting to charge him $434.00 for CPAP.  Pt wants [facility] in [place] [phone] and he has been told he can get the machine for just $14.00 a month, please call pt to discuss this request

## 2023-02-19 NOTE — Telephone Encounter (Signed)
Spoke to pt informed him [facility]ed [facility] this evening Pt expressed understanding and thanked me for calling

## 2023-02-22 ENCOUNTER — Other Ambulatory Visit: Payer: Self-pay | Admitting: Neurology

## 2023-02-22 DIAGNOSIS — G4733 Obstructive sleep apnea (adult) (pediatric): Secondary | ICD-10-CM

## 2023-02-22 DIAGNOSIS — E662 Morbid (severe) obesity with alveolar hypoventilation: Secondary | ICD-10-CM

## 2023-02-22 NOTE — Addendum Note (Signed)
Addended by: Raynald Kemp A on: 02/22/2023 09:02 AM   Modules accepted: Orders

## 2023-03-04 ENCOUNTER — Other Ambulatory Visit: Payer: Self-pay | Admitting: Family Medicine

## 2023-03-04 ENCOUNTER — Ambulatory Visit: Payer: Self-pay | Admitting: Family Medicine

## 2023-03-04 DIAGNOSIS — E119 Type 2 diabetes mellitus without complications: Secondary | ICD-10-CM

## 2023-03-04 NOTE — Telephone Encounter (Signed)
Medication Refill -  Most Recent Primary Care Visit:  Provider: Sherlyn Hay  Department: [facility]  Visit Type: PHYSICAL/ANNUAL WELLNESS  Date: 01/25/2023  Medication: tirzepatide [facility]) 5 MG/0.5ML Pen [[id number]]   Has the patient contacted their pharmacy? Yes (Agent: If yes, when and what did the pharmacy advise?) contact office   Is this the correct pharmacy for this prescription? Yes  This is the patient's preferred pharmacy:  [facility] [address] - [place], [place] - [address] [facility] [address] [facility] [address] [place] [address] Phone: [phone] Fax: [phone]   Has the prescription been filled recently? Yes  Is the patient out of the medication? Yes, took last shot yesterday   Has the patient been seen for an appointment in the last year OR does the patient have an upcoming appointment? Yes  Can we respond through MyChart? Yes  Agent: Please be advised that Rx refills may take up to 3 business days. We ask that you follow-up with your pharmacy.

## 2023-03-05 ENCOUNTER — Ambulatory Visit: Payer: BC Managed Care – PPO | Admitting: Family Medicine

## 2023-03-05 NOTE — Telephone Encounter (Signed)
 Requested medication (s) are due for refill today - yes  Requested medication (s) are on the active medication list -yes  Future visit scheduled -yes  Last refill: 01/25/23 2ml  Notes to clinic: off protocol- provider review   Requested Prescriptions  Pending Prescriptions Disp Refills   tirzepatide  (MOUNJARO ) 5 MG/0.5ML Pen 2 mL 0    Sig: Inject 5 mg into the skin once a week.     Off-Protocol Failed - 03/05/2023  2:47 PM      Failed - Medication not assigned to a protocol, review manually.      Passed - Valid encounter within last 12 months    Recent Outpatient Visits           1 month ago Type 2 diabetes mellitus without complication, without long-term current use of insulin (HCC)   [place] [facility] [place], Lauraine SAILOR, DO   3 months ago Type 2 diabetes mellitus with stage 2 chronic kidney disease, without long-term current use of insulin (HCC)   [place] [facility] P[place]ue, Lauraine SAILOR, DO       Future Appointments             In 3 days Pardue, Lauraine SAILOR, DO [place] [facility], [facility]               Requested Prescriptions  Pending Prescriptions Disp Refills   tirzepatide  (MOUNJARO ) 5 MG/0.5ML Pen 2 mL 0    Sig: Inject 5 mg into the skin once a week.     Off-Protocol Failed - 03/05/2023  2:47 PM      Failed - Medication not assigned to a protocol, review manually.      Passed - Valid encounter within last 12 months    Recent Outpatient Visits           1 month ago Type 2 diabetes mellitus without complication, without long-term current use of insulin (HCC)   [place] [facility] P[place]ue, Lauraine SAILOR, DO   3 months ago Type 2 diabetes mellitus with stage 2 chronic kidney disease, without long-term current use of insulin [facility])   [place] [facility] Pardue, Lauraine SAILOR, DO       Future Appointments             In 3 days Pardue, Lauraine SAILOR, DO [place] [facility],  [facility]

## 2023-03-08 ENCOUNTER — Ambulatory Visit (INDEPENDENT_AMBULATORY_CARE_PROVIDER_SITE_OTHER): Payer: BC Managed Care – PPO | Admitting: Family Medicine

## 2023-03-08 ENCOUNTER — Encounter: Payer: Self-pay | Admitting: Family Medicine

## 2023-03-08 VITALS — BP 139/76 | HR 72 | Ht 69.0 in | Wt 379.5 lb

## 2023-03-08 DIAGNOSIS — I152 Hypertension secondary to endocrine disorders: Secondary | ICD-10-CM

## 2023-03-08 DIAGNOSIS — E1159 Type 2 diabetes mellitus with other circulatory complications: Secondary | ICD-10-CM | POA: Diagnosis not present

## 2023-03-08 DIAGNOSIS — G4733 Obstructive sleep apnea (adult) (pediatric): Secondary | ICD-10-CM

## 2023-03-08 DIAGNOSIS — M1A0790 Idiopathic chronic gout, unspecified ankle and foot, without tophus (tophi): Secondary | ICD-10-CM | POA: Diagnosis not present

## 2023-03-08 DIAGNOSIS — E1122 Type 2 diabetes mellitus with diabetic chronic kidney disease: Secondary | ICD-10-CM | POA: Diagnosis not present

## 2023-03-08 DIAGNOSIS — E119 Type 2 diabetes mellitus without complications: Secondary | ICD-10-CM

## 2023-03-08 DIAGNOSIS — E662 Morbid (severe) obesity with alveolar hypoventilation: Secondary | ICD-10-CM | POA: Diagnosis not present

## 2023-03-08 DIAGNOSIS — E559 Vitamin D deficiency, unspecified: Secondary | ICD-10-CM

## 2023-03-08 DIAGNOSIS — Z6841 Body Mass Index (BMI) 40.0 and over, adult: Secondary | ICD-10-CM

## 2023-03-08 DIAGNOSIS — N182 Chronic kidney disease, stage 2 (mild): Secondary | ICD-10-CM

## 2023-03-08 DIAGNOSIS — Z7985 Long-term (current) use of injectable non-insulin antidiabetic drugs: Secondary | ICD-10-CM

## 2023-03-08 MED ORDER — TIRZEPATIDE 7.5 MG/0.5ML ~~LOC~~ SOAJ: 7.5000 mg | SUBCUTANEOUS | 1 refills | Status: DC

## 2023-03-08 NOTE — Assessment & Plan Note (Addendum)
 Addressed as noted above.

## 2023-03-08 NOTE — Assessment & Plan Note (Addendum)
 Noted. On Mounjaro as noted. No acute concerns.  Continue to monitor.

## 2023-03-08 NOTE — Progress Notes (Signed)
 Established patient visit   Patient: Leonard Pierce   DOB: [birth year]   43 y.o. Male  MRN: [id number] Visit Date: 03/08/2023  Today's healthcare provider: LAURAINE LOISE BUOY, DO   Chief Complaint  Patient presents with   Medical Management of Chronic Issues    Patient reports taking medications as prescribed with no symptoms to report.    Immunizations    Pneumococcal Vaccine declined   Subjective    HPI Leonard Pierce is a 43 year old male with diabetes and hypertension who presents for a follow-up visit.  He is managing his diabetes with Mounjaro  and lifestyle modifications. He has not been checking his blood sugars as he is waiting for a POC glucometer to arrive. He believes Mounjaro  is helping as his blood sugar levels are coming down. He exercises four days a week, primarily doing cardio and has started lifting weights. His diet includes oatmeal in the morning, a salad around noon, baked fish in the afternoon, and baked chicken with vegetables for dinner. He experiences occasional constipation with Mounjaro  but otherwise feels well.  He is managing hypertension with losartan  25 mg daily. His sister helps him check his blood pressure at home, which has been around 144/75 mmHg. He notes that his blood pressure has decreased significantly since starting treatment. He is also taking vitamin D  supplements and reports feeling well since starting them.  He is awaiting a CPAP machine for sleep apnea, which he is scheduled to pick up soon. He expresses concern about the cost of the CPAP machine due to a high deductible.  He has a history of gout and was taking allopurinol  daily, but there was an issue with the pharmacy not notifying him when his prescription was ready. He has been managing his cholesterol with medication since late October and is due for a cholesterol panel.  He is making lifestyle modifications, including increased exercise and dietary changes, to manage his health  conditions. He is also taking vitamin D  supplements for immune support.     Medications: Outpatient Medications Prior to Visit  Medication Sig   allopurinol  (ZYLOPRIM ) 100 MG tablet Take 1 tablet (100 mg total) by mouth daily.   Blood Glucose Monitoring Suppl DEVI 1 each by Does not apply route daily before breakfast. May substitute to any manufacturer covered by patient's insurance.   ciclopirox  (PENLAC ) 8 % solution Apply topically at bedtime. Apply over nail and surrounding skin. Apply daily over previous coat. After seven (7) days, may remove with alcohol and continue cycle.   Glucose Blood (BLOOD GLUCOSE TEST STRIPS) STRP 1 each by In Vitro route daily before breakfast. May substitute to any manufacturer covered by patient's insurance.   indomethacin  (INDOCIN ) 25 MG capsule Take 1 capsule (25 mg total) by mouth 3 (three) times daily with meals.   losartan  (COZAAR ) 25 MG tablet Take 1 tablet (25 mg total) by mouth daily.   rosuvastatin  (CRESTOR ) 5 MG tablet Take 1 tablet (5 mg total) by mouth daily.   Vitamin D , Ergocalciferol , (DRISDOL ) 1.25 MG (50000 UNIT) CAPS capsule Take 1 capsule (50,000 Units total) by mouth every 7 (seven) days.   [DISCONTINUED] tirzepatide  (MOUNJARO ) 5 MG/0.5ML Pen Inject 5 mg into the skin once a week.   colchicine  0.6 MG tablet Take 1 tablet (0.6 mg total) by mouth 2 (two) times daily.   Lancet Device MISC 1 each by Does not apply route daily before breakfast. May substitute to any manufacturer covered by patient's insurance.  Lancets Misc. MISC 1 each by Does not apply route daily before breakfast. May substitute to any manufacturer covered by patient's insurance.   No facility-administered medications prior to visit.       Objective    BP 139/76 (BP Location: Left Arm, Patient Position: Sitting, Cuff Size: Large)   Pulse 72   Ht 5' 9 (1.753 m)   Wt (!) 379 lb 8 oz (172.1 kg)   SpO2 100%   BMI 56.04 kg/m     Physical Exam Vitals and nursing  note reviewed.  Constitutional:      General: He is not in acute distress.    Appearance: Normal appearance.  HENT:     Head: Normocephalic and atraumatic.  Eyes:     General: No scleral icterus.    Conjunctiva/sclera: Conjunctivae normal.  Cardiovascular:     Rate and Rhythm: Normal rate.  Pulmonary:     Effort: Pulmonary effort is normal.  Neurological:     Mental Status: He is alert and oriented to person, place, and time. Mental status is at baseline.  Psychiatric:        Mood and Affect: Mood normal.        Behavior: Behavior normal.      No results found for any visits on 03/08/23.  Assessment & Plan    Type 2 diabetes mellitus with stage 2 chronic kidney disease, without long-term current use of insulin (HCC) Assessment & Plan: Reports improved blood glucose levels with current regimen. Exercising four days a week and following a healthy diet. Currently on Mounjaro  with mild constipation. Willing to increase Mounjaro  dose. Discussed risks (increased gastrointestinal side effects) and benefits (better glycemic control, potential weight loss).   - Increase Mounjaro  dose   - Order A1c and cholesterol panel   - Continue current diet and exercise regimen    Taking cholesterol medication daily since end of October. Discussed benefits (reduced cardiovascular risk) and potential side effects (muscle pain, liver enzyme elevation).   - Order cholesterol panel    Orders: -     Hemoglobin A1c -     Lipid panel -     Tirzepatide ; Inject 7.5 mg into the skin once a week.  Dispense: 2 mL; Refill: 1  Hypertension associated with type 2 diabetes mellitus (HCC) Assessment & Plan: Home blood pressure readings around 144/75 and 145/78, above target for a diabetic patient. Currently on losartan  25 mg. Discussed increasing dose, but he prefers to monitor for now. Explained risks (cardiovascular events) and benefits (better blood pressure control).   - Continue losartan  25 mg   - Monitor  blood pressure at home   - Reassess blood pressure control in 3 months     Morbid obesity (HCC) Assessment & Plan: Addressed as noted above.   Obesity hypoventilation syndrome (HCC) Assessment & Plan: Noted. On Mounjaro  as noted. No acute concerns.  Continue to monitor.   Chronic intermittent hypoxia with obstructive sleep apnea Assessment & Plan: Scheduled to pick up CPAP machine on the 17th. Expressed concerns about cost and insurance coverage. Discussed benefits (improved sleep quality, reduced cardiovascular risks) and risks of untreated sleep apnea (hypertension, cardiovascular disease).   - Pick up CPAP machine on the 17th   [employer] for coverage details     Chronic gout of ankle, unspecified cause, unspecified laterality Assessment & Plan: Inquired about allopurinol  prescription sent to [facility] but not notified for pickup. Discussed importance of medication in preventing gout flares and potential side effects (rash,  liver enzyme elevation).   - Confirm allopurinol  prescription at [facility]    Vitamin D  deficiency Assessment & Plan: Taking vitamin D  supplements. Discussed benefits (improved bone health, immune function) and risks of high doses (hypercalcemia).   - Continue vitamin D  supplementation     General Health Maintenance   Made significant lifestyle changes including increased exercise and dietary modifications. Discussed low-carb bread alternatives and benefits of low-carb diet in managing diabetes and weight.   - Continue current diet and exercise regimen   - Consider trying cloud bread or keto flatbread as low-carb alternatives     Follow-up   - Follow up in 3 months   - Send message via MyChart to update on Mounjaro  dose effectiveness.  Return in about 3 months (around 06/05/2023) for Weight.      I discussed the assessment and treatment plan with the patient  The patient was provided an opportunity to ask questions and all were answered. The  patient agreed with the plan and demonstrated an understanding of the instructions.   The patient was advised to call back or seek an in-person evaluation if the symptoms worsen or if the condition fails to improve as anticipated.    LAURAINE LOISE BUOY, DO  [facility] [phone] (phone) [phone] (fax)  [facility]

## 2023-03-17 NOTE — Assessment & Plan Note (Addendum)
 Reports improved blood glucose levels with current regimen. Exercising four days a week and following a healthy diet. Currently on Mounjaro with mild constipation. Willing to increase Mounjaro dose. Discussed risks (increased gastrointestinal side effects) and benefits (better glycemic control, potential weight loss).   - Increase Mounjaro dose   - Order A1c and cholesterol panel   - Continue current diet and exercise regimen    Taking cholesterol medication daily since end of October. Discussed benefits (reduced cardiovascular risk) and potential side effects (muscle pain, liver enzyme elevation).   - Order cholesterol panel

## 2023-03-17 NOTE — Assessment & Plan Note (Signed)
 Scheduled to pick up CPAP machine on the 17th. Expressed concerns about cost and insurance coverage. Discussed benefits (improved sleep quality, reduced cardiovascular risks) and risks of untreated sleep apnea (hypertension, cardiovascular disease).   - Pick up CPAP machine on the 17th   [employer] for coverage details

## 2023-03-17 NOTE — Assessment & Plan Note (Addendum)
 Home blood pressure readings around 144/75 and 145/78, above target for a diabetic patient. Currently on losartan 25 mg. Discussed increasing dose, but he prefers to monitor for now. Explained risks (cardiovascular events) and benefits (better blood pressure control).   - Continue losartan 25 mg   - Monitor blood pressure at home   - Reassess blood pressure control in 3 months

## 2023-03-17 NOTE — Assessment & Plan Note (Signed)
 Taking vitamin D supplements. Discussed benefits (improved bone health, immune function) and risks of high doses (hypercalcemia).   - Continue vitamin D supplementation

## 2023-03-17 NOTE — Assessment & Plan Note (Signed)
 Inquired about allopurinol prescription sent to [facility] but not notified for pickup. Discussed importance of medication in preventing gout flares and potential side effects (rash, liver enzyme elevation).   - Confirm allopurinol prescription at [facility]

## 2023-03-29 ENCOUNTER — Other Ambulatory Visit: Payer: Self-pay | Admitting: Family Medicine

## 2023-03-29 DIAGNOSIS — E1122 Type 2 diabetes mellitus with diabetic chronic kidney disease: Secondary | ICD-10-CM

## 2023-03-29 NOTE — Telephone Encounter (Signed)
 Copied from CRM [id number]. Topic: Clinical - Medication Refill >> Mar 29, 2023  4:54 PM Higinio Roger wrote: Most Recent Primary Care Visit:  Provider: Sherlyn Hay  Department: [facility]  Visit Type: PHYSICAL/ANNUAL WELLNESS  Date: 01/25/2023  Medication: tirzepatide [facility]) 7.5 MG/0.5ML Pen  Has the patient contacted their pharmacy? Yes (Agent: If no, request that the patient contact the pharmacy for the refill. If patient does not wish to contact the pharmacy document the reason why and proceed with request.)  Patient states it has to be sent in and he is on his last shot (Agent: If yes, when and what did the pharmacy advise?)  Is this the correct pharmacy for this prescription? Yes If no, delete pharmacy and type the correct one.  This is the patient's preferred pharmacy:   [facility] [address] - [place], [place] - [address] AT [address] [place] [address] Phone: [phone] Fax: [phone]   Has the prescription been filled recently? Yes  Is the patient out of the medication? No  Has the patient been seen for an appointment in the last year OR does the patient have an upcoming appointment? Yes  Can we respond through MyChart? Yes  Agent: Please be advised that Rx refills may take up to 3 business days. We ask that you follow-up with your pharmacy.

## 2023-04-01 NOTE — Telephone Encounter (Signed)
 [facility] called and spoke to Maralyn Sago, Pharmacist about the refill(s) [facility] requested. Advised it was sent on 03/08/23 #51mL/1 refill(s). Maralyn Sago stated the patient does have a refill available and will process it now. Pharmacy will notify patient when it is ready for pick-up.

## 2023-04-09 ENCOUNTER — Telehealth: Payer: Self-pay | Admitting: *Deleted

## 2023-04-09 NOTE — Telephone Encounter (Signed)
 I called patient to remind him to bring cpap machine tomorrow on 04/10/23 to visit. Pt reports he never received his cpap machine. Pt said [facility] and [facility] are too expensive. I explained that visit should be canceled tomorrow since he does not have cpap machine. Pt was currently at work and said he will call in the morning to discuss.

## 2023-04-10 ENCOUNTER — Encounter: Payer: Self-pay | Admitting: Neurology

## 2023-04-10 ENCOUNTER — Encounter: Payer: BC Managed Care – PPO | Admitting: Neurology

## 2023-04-26 ENCOUNTER — Other Ambulatory Visit: Payer: Self-pay | Admitting: Family Medicine

## 2023-04-26 DIAGNOSIS — E1122 Type 2 diabetes mellitus with diabetic chronic kidney disease: Secondary | ICD-10-CM

## 2023-04-29 ENCOUNTER — Other Ambulatory Visit: Payer: Self-pay | Admitting: Family Medicine

## 2023-04-29 DIAGNOSIS — E1122 Type 2 diabetes mellitus with diabetic chronic kidney disease: Secondary | ICD-10-CM

## 2023-04-29 NOTE — Telephone Encounter (Signed)
 Requested medication (s) are due for refill today: yes  Requested medication (s) are on the active medication list: yes  Last refill:    Future visit scheduled: yes  Notes to clinic:  Medication not assigned to a protocol, review manually.      Requested Prescriptions  Pending Prescriptions Disp Refills   MOUNJARO 7.5 MG/0.5ML Pen [Pharmacy Med Name: MOUNJARO 7.5 MG/0.5 ML PEN[$R^]] 2 mL 1    Sig: INJECT THE CONTENTS OF ONE PEN UNDER THE SKIN WEEKLY ON THE SAME DAY EACH WEEK     Off-Protocol Failed - 04/29/2023  1:43 PM      Failed - Medication not assigned to a protocol, review manually.      Passed - Valid encounter within last 12 months    Recent Outpatient Visits           1 month ago Type 2 diabetes mellitus with stage 2 chronic kidney disease, without long-term current use of insulin (HCC)   [facility], Monico Blitz, DO       Future Appointments             In 1 month Pardue, Monico Blitz, DO [facility], [facility]

## 2023-04-29 NOTE — Telephone Encounter (Signed)
 Copied from CRM [id number]. Topic: Clinical - Medication Refill >> Apr 29, 2023 12:20 PM Clayton Bibles wrote: Most Recent Primary Care Visit:  Provider: Sherlyn Hay  Department: [facility]  Visit Type: OFFICE VISIT  Date: 03/08/2023  Medication: tirzepatide [facility]) 7.5 MG/0.5ML Pen  Has the patient contacted their pharmacy? Yes (Agent: If no, request that the patient contact the pharmacy for the refill. If patient does not wish to contact the pharmacy document the reason why and proceed with request.) (Agent: If yes, when and what did the pharmacy advise?) Pharmacy needs order to refill medication   Is this the correct pharmacy for this prescription? Yes If no, delete pharmacy and type the correct one.  This is the patient's preferred pharmacy:  [facility] [address] - [place], [place] - [address] [place] [address] Phone: [phone] Fax: [phone]   Has the prescription been filled recently? No  Is the patient out of the medication? Yes  Has the patient been seen for an appointment in the last year OR does the patient have an upcoming appointment? Yes  Can we respond through MyChart? Yes  Agent: Please be advised that Rx refills may take up to 3 business days. We ask that you follow-up with your pharmacy.

## 2023-04-30 NOTE — Telephone Encounter (Signed)
 Requested medication (s) are due for refill today - no  Requested medication (s) are on the active medication list -yes  Future visit scheduled -yes  Last refill: 04/29/23 2ml 1RF  Notes to clinic: off protocol- provider review - duplicate request  Requested Prescriptions  Pending Prescriptions Disp Refills   tirzepatide (MOUNJARO) 7.5 MG/0.5ML Pen 2 mL 1    Sig: Inject 7.5 mg into the skin once a week.     Off-Protocol Failed - 04/30/2023 12:29 PM      Failed - Medication not assigned to a protocol, review manually.      Passed - Valid encounter within last 12 months    Recent Outpatient Visits           1 month ago Type 2 diabetes mellitus with stage 2 chronic kidney disease, without long-term current use of insulin (HCC)   [facility] P[facility]ue, Monico Blitz, DO       Future Appointments             In 1 month Pardue, Monico Blitz, DO [facility], [facility]               Requested Prescriptions  Pending Prescriptions Disp Refills   tirzepatide (MOUNJARO) 7.5 MG/0.5ML Pen 2 mL 1    Sig: Inject 7.5 mg into the skin once a week.     Off-Protocol Failed - 04/30/2023 12:29 PM      Failed - Medication not assigned to a protocol, review manually.      Passed - Valid encounter within last 12 months    Recent Outpatient Visits           1 month ago Type 2 diabetes mellitus with stage 2 chronic kidney disease, without long-term current use of insulin (HCC)   [facility] P[facility]ue, Monico Blitz, DO       Future Appointments             In 1 month Pardue, Monico Blitz, DO  [facility], [facility]

## 2023-05-31 ENCOUNTER — Other Ambulatory Visit (HOSPITAL_COMMUNITY): Payer: Self-pay

## 2023-05-31 ENCOUNTER — Telehealth: Payer: Self-pay

## 2023-05-31 NOTE — Telephone Encounter (Signed)
 Pharmacy Patient Advocate Encounter  Received notification from [facility] that Prior Authorization for Mounjaro 7.5MG /0.5ML auto-injectors has been APPROVED from 05/31/23 to 10/31/23. Ran test claim, Copay is $25. This test claim was processed through [facility]- copay amounts may vary at other pharmacies due to pharmacy/plan contracts, or as the patient moves through the different stages of their insurance plan.   PA #/Case ID/Reference #: [id number]

## 2023-06-03 ENCOUNTER — Ambulatory Visit (INDEPENDENT_AMBULATORY_CARE_PROVIDER_SITE_OTHER): Payer: Self-pay | Admitting: Family Medicine

## 2023-06-03 ENCOUNTER — Encounter: Payer: Self-pay | Admitting: Family Medicine

## 2023-06-03 VITALS — BP 136/76 | HR 60 | Resp 16 | Ht 70.0 in | Wt 378.8 lb

## 2023-06-03 DIAGNOSIS — E1159 Type 2 diabetes mellitus with other circulatory complications: Secondary | ICD-10-CM

## 2023-06-03 DIAGNOSIS — E662 Morbid (severe) obesity with alveolar hypoventilation: Secondary | ICD-10-CM | POA: Diagnosis not present

## 2023-06-03 DIAGNOSIS — E1169 Type 2 diabetes mellitus with other specified complication: Secondary | ICD-10-CM

## 2023-06-03 DIAGNOSIS — E7800 Pure hypercholesterolemia, unspecified: Secondary | ICD-10-CM

## 2023-06-03 DIAGNOSIS — Z6841 Body Mass Index (BMI) 40.0 and over, adult: Secondary | ICD-10-CM

## 2023-06-03 DIAGNOSIS — G4733 Obstructive sleep apnea (adult) (pediatric): Secondary | ICD-10-CM

## 2023-06-03 DIAGNOSIS — E559 Vitamin D deficiency, unspecified: Secondary | ICD-10-CM

## 2023-06-03 DIAGNOSIS — M1A0790 Idiopathic chronic gout, unspecified ankle and foot, without tophus (tophi): Secondary | ICD-10-CM

## 2023-06-03 DIAGNOSIS — B351 Tinea unguium: Secondary | ICD-10-CM

## 2023-06-03 DIAGNOSIS — I152 Hypertension secondary to endocrine disorders: Secondary | ICD-10-CM

## 2023-06-03 MED ORDER — LOSARTAN POTASSIUM 25 MG PO TABS: 25.0000 mg | ORAL_TABLET | Freq: Every day | ORAL | 1 refills | Status: DC

## 2023-06-03 MED ORDER — TIRZEPATIDE 2.5 MG/0.5ML ~~LOC~~ SOAJ: 2.5000 mg | SUBCUTANEOUS | 0 refills | Status: DC

## 2023-06-03 MED ORDER — TIRZEPATIDE 5 MG/0.5ML ~~LOC~~ SOAJ: 5.0000 mg | SUBCUTANEOUS | 0 refills | Status: DC

## 2023-06-03 MED ORDER — TIRZEPATIDE 7.5 MG/0.5ML ~~LOC~~ SOAJ: 7.5000 mg | SUBCUTANEOUS | 0 refills | Status: DC

## 2023-06-03 MED ORDER — TIRZEPATIDE 10 MG/0.5ML ~~LOC~~ SOAJ: 10.0000 mg | SUBCUTANEOUS | 0 refills | Status: DC

## 2023-06-03 MED ORDER — ROSUVASTATIN CALCIUM 5 MG PO TABS: 5.0000 mg | ORAL_TABLET | Freq: Every day | ORAL | 3 refills | Status: AC

## 2023-06-03 MED ORDER — ALLOPURINOL 100 MG PO TABS: 100.0000 mg | ORAL_TABLET | Freq: Every day | ORAL | 1 refills | Status: DC

## 2023-06-03 MED ORDER — CICLOPIROX 8 % EX SOLN: Freq: Every day | CUTANEOUS | 3 refills | Status: AC

## 2023-06-03 NOTE — Progress Notes (Signed)
 Established patient visit   Patient: Leonard Pierce   DOB: 06-Aug-1980   43 y.o. Male  MRN: [id number] Visit Date: 06/03/2023  Today's healthcare provider: Carlean Charter, DO   Chief Complaint  Patient presents with   Follow-up    3 month f/u wt loss no other concerns   Subjective    HPI Leonard Pierce is a 43 year old male with diabetes who presents for medication management and follow-up.  He has been off Mounjaro for the past three weeks due to insurance and pharmacy issues. His new insurance now covers the medication, and a prior authorization was approved. He was previously on a 7.5 mg dose for about two months but did not transition to a 10 mg dose as planned. He last took a dose on a Monday 05/13/23, three weeks ago.  He has lost 23 pounds since October 2024.  He is currently taking allopurinol  and requests a refill for losartan . He also mentions needing a refill for ciclopirox  for his nail, which has been an issue for a long time but does not cause pain. He is not taking colchicine  or indomethacin  currently.  He declined the COVID booster previously and does not plan to receive it.   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 This job involves more walking and offers better pay. He has new insurance through this job, which provides [facility].     Medications: Outpatient Medications Prior to Visit  Medication Sig   Blood Glucose Monitoring Suppl DEVI 1 each by Does not apply route daily before breakfast. May substitute to any manufacturer covered by patient's insurance.   Glucose Blood (BLOOD GLUCOSE TEST STRIPS) STRP 1 each by In Vitro route daily before breakfast. May substitute to any manufacturer covered by patient's insurance.   indomethacin  (INDOCIN ) 25 MG capsule Take 1 capsule (25 mg total) by mouth 3 (three) times daily with meals.   Vitamin D , Ergocalciferol , (DRISDOL ) 1.25 MG (50000 UNIT) CAPS capsule Take 1 capsule  (50,000 Units total) by mouth every 7 (seven) days.   [DISCONTINUED] allopurinol  (ZYLOPRIM ) 100 MG tablet Take 1 tablet (100 mg total) by mouth daily.   [DISCONTINUED] ciclopirox  (PENLAC ) 8 % solution Apply topically at bedtime. Apply over nail and surrounding skin. Apply daily over previous coat. After seven (7) days, may remove with alcohol and continue cycle.   [DISCONTINUED] losartan  (COZAAR ) 25 MG tablet Take 1 tablet (25 mg total) by mouth daily.   [DISCONTINUED] MOUNJARO 7.5 MG/0.5ML Pen INJECT THE CONTENTS OF ONE PEN UNDER THE SKIN WEEKLY ON THE SAME DAY EACH WEEK   [DISCONTINUED] rosuvastatin  (CRESTOR ) 5 MG tablet Take 1 tablet (5 mg total) by mouth daily.   colchicine  0.6 MG tablet Take 1 tablet (0.6 mg total) by mouth 2 (two) times daily.   Lancet Device MISC 1 each by Does not apply route daily before breakfast. May substitute to any manufacturer covered by patient's insurance.   Lancets Misc. MISC 1 each by Does not apply route daily before breakfast. May substitute to any manufacturer covered by patient's insurance.   No facility-administered medications prior to visit.    Review of Systems  Respiratory: Negative.  Negative for cough, shortness of breath and wheezing.   Cardiovascular:  Negative for chest pain, palpitations and leg swelling.  Gastrointestinal:  Negative for nausea and vomiting.  Endocrine: Negative for polydipsia, polyphagia and polyuria.  Neurological:  Negative for weakness and  headaches.        Objective    BP 136/76 (BP Location: Left Arm, Patient Position: Sitting, Cuff Size: Large)   Pulse 60   Resp 16   Ht 5\' 10"  (1.778 m)   Wt (!) 378 lb 12.8 oz (171.8 kg)   SpO2 100%   BMI 54.35 kg/m     Physical Exam Vitals and nursing note reviewed.  Constitutional:      General: He is not in acute distress.    Appearance: Normal appearance.  HENT:     Head: Normocephalic and atraumatic.  Eyes:     General: No scleral icterus.     Conjunctiva/sclera: Conjunctivae normal.  Cardiovascular:     Rate and Rhythm: Normal rate.  Pulmonary:     Effort: Pulmonary effort is normal.  Neurological:     Mental Status: He is alert and oriented to person, place, and time. Mental status is at baseline.  Psychiatric:        Mood and Affect: Mood normal.        Behavior: Behavior normal.      No results found for any visits on 06/03/23.  Assessment & Plan    Type 2 diabetes mellitus with other specified complication, without long-term current use of insulin (HCC) -     Comprehensive metabolic panel with GFR -     Hemoglobin A1c -     Lipid panel -     Losartan  Potassium; Take 1 tablet (25 mg total) by mouth daily.  Dispense: 30 tablet; Refill: 1 -     Rosuvastatin  Calcium ; Take 1 tablet (5 mg total) by mouth daily.  Dispense: 90 tablet; Refill: 3 -     Tirzepatide; Inject 2.5 mg into the skin once a week.  Dispense: 2 mL; Refill: 0 -     Tirzepatide; Inject 5 mg into the skin once a week.  Dispense: 2 mL; Refill: 0 -     Tirzepatide; Inject 7.5 mg into the skin once a week.  Dispense: 2 mL; Refill: 0 -     Tirzepatide; Inject 10 mg into the skin once a week.  Dispense: 2 mL; Refill: 0 -     Ambulatory referral to Podiatry  Obesity hypoventilation syndrome (HCC)  Chronic intermittent hypoxia with obstructive sleep apnea  Hypertension associated with type 2 diabetes mellitus (HCC)  Morbid obesity with body mass index (BMI) of 50.0 to 59.9 in adult [facility])  Chronic gout of ankle, unspecified cause, unspecified laterality -     Uric acid -     Allopurinol ; Take 1 tablet (100 mg total) by mouth daily.  Dispense: 90 tablet; Refill: 1  Vitamin D  deficiency -     VITAMIN D  25 Hydroxy (Vit-D Deficiency, Fractures)  Onychomycosis of multiple toenails with type 2 diabetes mellitus (HCC) -     Ciclopirox ; Apply topically at bedtime. Apply over nail and surrounding skin. Apply daily over previous coat. After seven (7) days, may  remove with alcohol and continue cycle.  Dispense: 6.6 mL; Refill: 3  Elevated LDL cholesterol level     Type 2 diabetes mellitus; elevated LDL Mounjaro therapy was interrupted; restarting at a lower dose to mitigate side effects. - Restart Mounjaro at 2.5 mg, titrate up every four weeks to 10 mg, monitor side effects. - Continue rosuvastatin  5 mg daily  Hypertension associated with type 2 diabetes Chronic, stable.  Continue losartan  25 mg daily.  Gout Gout managed with allopurinol ; requires losartan  refill. - Continue allopurinol . -  Order blood work for uric acid. - Refill losartan  and rosuvastatin .  Morbid obesity with BMI 50.0-59.9; Obesity hypoventilation syndrome Congratulated patient on weight loss.  Counseled patient on diet and exercise.  Encouraged him to continue his efforts to increase his activity.  Vitamin D  deficiency Patient has completed vitamin D  supplementation. - Order blood work for vitamin D  levels.  Onychomycosis Chronic onychomycosis, previously responsive to ciclopirox  Portion of right great toenail is black; discussed impact of health on nail color/appearance. Referring for additonal evaluation and recommendations. - Refer to podiatry. - Prescribe ciclopirox .   Return in about 15 weeks (around 09/16/2023) for CPE, DM, HTN.      I discussed the assessment and treatment plan with the patient  The patient was provided an opportunity to ask questions and all were answered. The patient agreed with the plan and demonstrated an understanding of the instructions.   The patient was advised to call back or seek an in-person evaluation if the symptoms worsen or if the condition fails to improve as anticipated.    Carlean Charter, DO  [facility] [phone] (phone) [phone] (fax)  [facility]

## 2023-06-04 LAB — COMPREHENSIVE METABOLIC PANEL WITH GFR
ALT: 21 IU/L (ref 0–44)
AST: 19 IU/L (ref 0–40)
Albumin: 3.9 g/dL — ABNORMAL LOW (ref 4.1–5.1)
Alkaline Phosphatase: 65 IU/L (ref 44–121)
BUN/Creatinine Ratio: 12 (ref 9–20)
BUN: 12 mg/dL (ref 6–24)
Bilirubin Total: 0.4 mg/dL (ref 0.0–1.2)
CO2: 23 mmol/L (ref 20–29)
Calcium: 9.5 mg/dL (ref 8.7–10.2)
Chloride: 102 mmol/L (ref 96–106)
Creatinine, Ser: 0.97 mg/dL (ref 0.76–1.27)
Globulin, Total: 2.9 g/dL (ref 1.5–4.5)
Glucose: 105 mg/dL — ABNORMAL HIGH (ref 70–99)
Potassium: 4.2 mmol/L (ref 3.5–5.2)
Sodium: 140 mmol/L (ref 134–144)
Total Protein: 6.8 g/dL (ref 6.0–8.5)
eGFR: 98 mL/min/{1.73_m2} (ref >59)

## 2023-06-04 LAB — VITAMIN D 25 HYDROXY (VIT D DEFICIENCY, FRACTURES): Vit D, 25-Hydroxy: 30.1 ng/mL (ref 30.0–100.0)

## 2023-06-04 LAB — LIPID PANEL
Chol/HDL Ratio: 4 ratio (ref 0.0–5.0)
Cholesterol, Total: 186 mg/dL (ref 100–199)
HDL: 47 mg/dL (ref >39)
LDL Chol Calc (NIH): 125 mg/dL — ABNORMAL HIGH (ref 0–99)
Triglycerides: 76 mg/dL (ref 0–149)
VLDL Cholesterol Cal: 14 mg/dL (ref 5–40)

## 2023-06-04 LAB — URIC ACID: Uric Acid: 8.4 mg/dL (ref 3.8–8.4)

## 2023-06-04 LAB — HEMOGLOBIN A1C
Est. average glucose Bld gHb Est-mCnc: 131 mg/dL
Hgb A1c MFr Bld: 6.2 % — ABNORMAL HIGH (ref 4.8–5.6)

## 2023-06-07 ENCOUNTER — Ambulatory Visit: Payer: BC Managed Care – PPO | Admitting: Family Medicine

## 2023-06-12 ENCOUNTER — Telehealth: Payer: Self-pay

## 2023-06-12 DIAGNOSIS — E1169 Type 2 diabetes mellitus with other specified complication: Secondary | ICD-10-CM

## 2023-06-12 MED ORDER — TIRZEPATIDE 2.5 MG/0.5ML ~~LOC~~ SOAJ: 2.5000 mg | SUBCUTANEOUS | 0 refills | Status: DC

## 2023-06-12 NOTE — Telephone Encounter (Signed)
 Copied from CRM [id number]. Topic: Clinical - Prescription Issue >> Jun 12, 2023 10:01 AM Star East wrote: Reason for CRM: tirzepatide (MOUNJARO) 2.5 MG/0.5ML Pen- [facility], asking [facility]

## 2023-06-13 ENCOUNTER — Other Ambulatory Visit (HOSPITAL_COMMUNITY): Payer: Self-pay

## 2023-06-18 ENCOUNTER — Ambulatory Visit: Payer: Self-pay | Admitting: Family Medicine

## 2023-06-18 DIAGNOSIS — E559 Vitamin D deficiency, unspecified: Secondary | ICD-10-CM

## 2023-06-18 DIAGNOSIS — E1169 Type 2 diabetes mellitus with other specified complication: Secondary | ICD-10-CM

## 2023-06-18 MED ORDER — VITAMIN D (ERGOCALCIFEROL) 1.25 MG (50000 UNIT) PO CAPS: 50000.0000 [IU] | ORAL_CAPSULE | ORAL | 1 refills | Status: DC

## 2023-06-27 ENCOUNTER — Telehealth: Payer: Self-pay | Admitting: Family Medicine

## 2023-06-27 ENCOUNTER — Ambulatory Visit: Payer: Self-pay

## 2023-06-27 NOTE — Telephone Encounter (Signed)
 Copied from CRM [id number]. Topic: Clinical - Prescription Issue >> Jun 27, 2023  5:32 PM Stanly Early wrote: Reason for CRM: tirzepatide (MOUNJARO) 5 MG/0.5ML Pen would like to be sent to [facility] so it can be cheaper and request the PA send it over [address] [place] [address] [phone]

## 2023-07-01 ENCOUNTER — Telehealth: Payer: Self-pay

## 2023-07-01 NOTE — Telephone Encounter (Signed)
 Copied from CRM [id number]. Topic: Clinical - Medication Prior Auth >> Jul 01, 2023  9:46 AM Corin V wrote: Reason for CRM: Patient is needing a prior authorization for his [facility]ounjaro prescription to [facility] on [address].

## 2023-07-02 ENCOUNTER — Other Ambulatory Visit (HOSPITAL_COMMUNITY): Payer: Self-pay

## 2023-07-02 NOTE — Telephone Encounter (Unsigned)
 Copied from CRM [id number]. Topic: Clinical - Medication Prior Auth >> Jul 02, 2023 10:31 AM Baldomero Bone wrote: Reason for CRM: Patient is inquiring about the prior authorization for tirzepatide (MOUNJARO) 2.5 MG/0.5ML Pen. Callback number is [phone]

## 2023-07-03 ENCOUNTER — Telehealth: Payer: Self-pay

## 2023-07-03 NOTE — Telephone Encounter (Signed)
 Copied from CRM [id number]. Topic: Clinical - Medication Prior Auth >> Jul 03, 2023 12:58 PM Elle L wrote: Reason for CRM: The patient called with his Pharmacy on the line and they are requiring a prior authorization for his Mounjaro 2.5 MG and do not have that it was approved on 05/31/2023.

## 2023-07-03 NOTE — Telephone Encounter (Signed)
 Pt checking the status of Mounjaro 2.5mg . Read the notes to the patient regarding the cost per the test claim. He will check with his pharmacy and go online to see about the coupons.

## 2023-07-04 ENCOUNTER — Other Ambulatory Visit (HOSPITAL_COMMUNITY): Payer: Self-pay

## 2023-07-05 ENCOUNTER — Other Ambulatory Visit (HOSPITAL_COMMUNITY): Payer: Self-pay

## 2023-07-08 ENCOUNTER — Ambulatory Visit: Payer: Self-pay

## 2023-07-08 NOTE — Telephone Encounter (Signed)
 FYI Only or Action Required?: FYI only for provider  Patient was last seen in primary care on 06/03/2023 by Carlean Charter, DO. Called Nurse Triage reporting lab results. Symptoms began NA. Interventions attempted: Other: NA. Symptoms are: NA.  Triage Disposition: Call PCP When Office is Open  Patient/caregiver understands and will follow disposition?: No   Copied from CRM [id number]. Topic: Clinical - Red Word Triage >> Jul 08, 2023 11:19 AM Carlatta H wrote: [facility] that prompted transfer to Nurse Triage: Per patient chart notes:LMTCB, E2C2 Triage Nurse may give patient results/message Reason for Disposition  Caller requesting routine or non-urgent lab result  Answer Assessment - Initial Assessment Questions 1. REASON FOR CALL or QUESTION: "What is your reason for calling today?" or "How can I best help you?" or "What question do you have that I can help answer?"     Lab results 2. CALLER: Document the source of call. (e.g., laboratory, patient).     Patient  Additional info: understands lab results. No further questions  Protocols used: PCP Call - No Triage-A-AH

## 2023-07-11 ENCOUNTER — Ambulatory Visit: Payer: Self-pay | Admitting: Podiatry

## 2023-08-30 ENCOUNTER — Telehealth: Payer: Self-pay

## 2023-08-30 NOTE — Telephone Encounter (Signed)
 Copied from CRM [id number]. Topic: Clinical - Medication Question >> Aug 30, 2023 10:42 AM Treva T wrote: Reason for CRM: Received call from patient, reports he has new insurance, [employer], and want to restart taking Mounjaro .  Informed patient new insurance details were needed to update current insurance on file. Patient states insurance if effective as of today, 08/30/23, however patient states he only has the group number of new insurance.   Information provided, [employer] group [id number], per patient will be reaching out to obtain insurance ID#.  Patient requesting a follow up from nurse to inquire of what additional information is needed to begin PA process to restart medication.   Can be reached at [phone] to discuss further.    Patient requesting for nurse to contact insurance company for PA to restart medication.   Patient can be reached at [phone].

## 2023-09-02 ENCOUNTER — Other Ambulatory Visit (HOSPITAL_COMMUNITY): Payer: Self-pay

## 2023-09-04 ENCOUNTER — Other Ambulatory Visit (HOSPITAL_COMMUNITY): Payer: Self-pay

## 2023-09-04 NOTE — Telephone Encounter (Unsigned)
 Copied from CRM #[id number]. Topic: General - Other >> Sep 04, 2023  3:29 PM Wess RAMAN wrote: Reason for CRM: Patient wanted to let Donzella Domino, DO know that it was safe to run his insurance for tirzepatide  (MOUNJARO ) 2.5 MG/0.5ML Pen   Callback #: [phone]

## 2023-09-04 NOTE — Telephone Encounter (Signed)
 We will need a copy of the patient's insurance card in order to proceed with the prior authorization. Can patient please upload a copy of his card?

## 2023-09-05 ENCOUNTER — Telehealth: Payer: Self-pay

## 2023-09-05 ENCOUNTER — Other Ambulatory Visit (HOSPITAL_COMMUNITY): Payer: Self-pay

## 2023-09-05 NOTE — Telephone Encounter (Signed)
PA has been submitted, thank you

## 2023-09-05 NOTE — Telephone Encounter (Signed)
Clinical question answered, PA currently pending

## 2023-09-05 NOTE — Telephone Encounter (Signed)
 Pharmacy Patient Advocate Encounter   Received notification from Pt Calls Messages that prior authorization for Mounjaro  2.5MG /0.5ML auto-injectors is required/requested.   Insurance verification completed.   The patient is insured through [employer] .   Per test claim: PA required; PA started via CoverMyMeds. KEY [id number] . Waiting for clinical questions to populate.

## 2023-09-11 NOTE — Telephone Encounter (Signed)
 Pharmacy Patient Advocate Encounter  Received notification from [employer] that Prior Authorization for Mounjaro  2.5MG /0.5ML auto-injectors  has been APPROVED from 09/05/2023 to 09/08/2024   PA #/Case ID/Reference #: [id number]

## 2023-09-16 ENCOUNTER — Encounter: Admitting: Family Medicine

## 2023-10-21 ENCOUNTER — Encounter: Payer: Self-pay | Admitting: Family Medicine

## 2023-10-21 ENCOUNTER — Ambulatory Visit (INDEPENDENT_AMBULATORY_CARE_PROVIDER_SITE_OTHER): Admitting: Family Medicine

## 2023-10-21 VITALS — BP 144/88 | HR 79 | Temp 98.2°F | Ht 70.0 in | Wt 379.3 lb

## 2023-10-21 DIAGNOSIS — M1A0790 Idiopathic chronic gout, unspecified ankle and foot, without tophus (tophi): Secondary | ICD-10-CM

## 2023-10-21 DIAGNOSIS — E559 Vitamin D deficiency, unspecified: Secondary | ICD-10-CM

## 2023-10-21 DIAGNOSIS — E1159 Type 2 diabetes mellitus with other circulatory complications: Secondary | ICD-10-CM

## 2023-10-21 DIAGNOSIS — J069 Acute upper respiratory infection, unspecified: Secondary | ICD-10-CM | POA: Diagnosis not present

## 2023-10-21 DIAGNOSIS — Z6841 Body Mass Index (BMI) 40.0 and over, adult: Secondary | ICD-10-CM

## 2023-10-21 DIAGNOSIS — I152 Hypertension secondary to endocrine disorders: Secondary | ICD-10-CM

## 2023-10-21 DIAGNOSIS — M79671 Pain in right foot: Secondary | ICD-10-CM | POA: Diagnosis not present

## 2023-10-21 DIAGNOSIS — E1169 Type 2 diabetes mellitus with other specified complication: Secondary | ICD-10-CM

## 2023-10-21 DIAGNOSIS — G4733 Obstructive sleep apnea (adult) (pediatric): Secondary | ICD-10-CM

## 2023-10-21 MED ORDER — ALLOPURINOL 100 MG PO TABS: 100.0000 mg | ORAL_TABLET | Freq: Every day | ORAL | 1 refills | Status: AC

## 2023-10-21 MED ORDER — TIRZEPATIDE 7.5 MG/0.5ML ~~LOC~~ SOAJ: 7.5000 mg | SUBCUTANEOUS | 0 refills | Status: DC

## 2023-10-21 MED ORDER — LOSARTAN POTASSIUM 25 MG PO TABS: 25.0000 mg | ORAL_TABLET | Freq: Every day | ORAL | 1 refills | Status: AC

## 2023-10-21 MED ORDER — TIRZEPATIDE 10 MG/0.5ML ~~LOC~~ SOAJ: 10.0000 mg | SUBCUTANEOUS | 3 refills | Status: DC

## 2023-10-21 NOTE — Progress Notes (Signed)
 Established patient visit   Patient: Leonard Pierce   DOB: 10-22-1980   43 y.o. Male  MRN: [id number] Visit Date: 10/21/2023  Today's healthcare provider: LAURAINE LOISE BUOY, DO   Chief Complaint  Patient presents with   Acute Visit    -Patient is here because he states he is coughing up some phlegm, yellowish green color.    -Reports that he has been having trouble with right heel since this past weekend. -States he feels better than he did has been taking Theraflu, missed worked on Thursday and Friday.  -Medication refills.   Subjective    HPI Leonard Pierce is a 43 year old male with diabetes and hypertension who presents with cough and heel pain.  He has been experiencing a cough with phlegm production, which has improved but caused him to miss work on Thursday and Friday. The cough is now intermittent. No fevers, chills, shortness of breath, congestion, sore throat, ear pain, sinus pressure, or chest pain.  Heel pain began after wearing inappropriate footwear. The pain was throbbing and stabbing, affecting various parts of the heel, but has improved and is now only slightly sore occasionally.  He is on Mounjaro  5.0 mg for diabetes and is in his third week of the current dose. He has lost weight from 410 lbs to 379 lbs due to increased physical activity, including walking, lifting weights, and using the steam room. He has not been checking his blood glucose levels but feels better overall. He eats less, often only one meal a day, but has more energy.  He checks his blood pressure weekly at [employer] [facility]. He is out of losartan , of which he was taking at 25 mg, and believes this may be contributing to his elevated blood pressure. He also takes rosuvastatin  5 mg and allopurinol , which he is running low on.  He has been diagnosed with sleep apnea and was recommended to use a CPAP machine, but has not obtained one due to cost. He is interested in checking with his new insurance to see if it  might cover the cost of a CPAP machine.       Medications: Outpatient Medications Prior to Visit  Medication Sig   Blood Glucose Monitoring Suppl DEVI 1 each by Does not apply route daily before breakfast. May substitute to any manufacturer covered by patient's insurance.   ciclopirox  (PENLAC ) 8 % solution Apply topically at bedtime. Apply over nail and surrounding skin. Apply daily over previous coat. After seven (7) days, may remove with alcohol and continue cycle.   colchicine  0.6 MG tablet Take 1 tablet (0.6 mg total) by mouth 2 (two) times daily.   Glucose Blood (BLOOD GLUCOSE TEST STRIPS) STRP 1 each by In Vitro route daily before breakfast. May substitute to any manufacturer covered by patient's insurance.   indomethacin  (INDOCIN ) 25 MG capsule Take 1 capsule (25 mg total) by mouth 3 (three) times daily with meals.   Lancet Device MISC 1 each by Does not apply route daily before breakfast. May substitute to any manufacturer covered by patient's insurance.   Lancets Misc. MISC 1 each by Does not apply route daily before breakfast. May substitute to any manufacturer covered by patient's insurance.   rosuvastatin  (CRESTOR ) 5 MG tablet Take 1 tablet (5 mg total) by mouth daily.   Vitamin D , Ergocalciferol , (DRISDOL ) 1.25 MG (50000 UNIT) CAPS capsule Take 1 capsule (50,000 Units total) by mouth every 7 (seven) days.   [DISCONTINUED] allopurinol  (ZYLOPRIM ) 100  MG tablet Take 1 tablet (100 mg total) by mouth daily.   [DISCONTINUED] losartan  (COZAAR ) 25 MG tablet Take 1 tablet (25 mg total) by mouth daily.   [DISCONTINUED] tirzepatide  (MOUNJARO ) 10 MG/0.5ML Pen Inject 10 mg into the skin once a week.   [DISCONTINUED] tirzepatide  (MOUNJARO ) 2.5 MG/0.5ML Pen Inject 2.5 mg into the skin once a week.   [DISCONTINUED] tirzepatide  (MOUNJARO ) 5 MG/0.5ML Pen Inject 5 mg into the skin once a week.   [DISCONTINUED] tirzepatide  (MOUNJARO ) 7.5 MG/0.5ML Pen Inject 7.5 mg into the skin once a week.   No  facility-administered medications prior to visit.    Review of Systems  Constitutional:  Negative for chills and fever.  HENT:  Negative for congestion, ear pain, sinus pressure, sinus pain, sore throat and trouble swallowing.   Respiratory:  Positive for cough (productive). Negative for shortness of breath.   Cardiovascular:  Negative for chest pain.        Objective    BP (!) 144/88 (BP Location: Right Arm, Patient Position: Sitting, Cuff Size: Large) Comment (BP Location): Forearm  Pulse 79   Temp 98.2 F (36.8 C) (Oral)   Ht 5' 10 (1.778 m)   Wt (!) 379 lb 4.8 oz (172 kg)   SpO2 98%   BMI 54.42 kg/m     Physical Exam Vitals and nursing note reviewed.  Constitutional:      General: He is not in acute distress.    Appearance: Normal appearance.  HENT:     Head: Normocephalic and atraumatic.  Eyes:     General: No scleral icterus.    Conjunctiva/sclera: Conjunctivae normal.  Cardiovascular:     Rate and Rhythm: Normal rate.  Pulmonary:     Effort: Pulmonary effort is normal.  Neurological:     Mental Status: He is alert and oriented to person, place, and time. Mental status is at baseline.  Psychiatric:        Mood and Affect: Mood normal.        Behavior: Behavior normal.      Results for orders placed or performed in visit on 10/21/23  Microalbumin / creatinine urine ratio  Result Value Ref Range   Creatinine, Urine 55.2 Not Estab. mg/dL   Microalbumin, Urine <6.9 Not Estab. ug/mL   Microalb/Creat Ratio <5 0 - 29 mg/g creat  Comprehensive metabolic panel with GFR  Result Value Ref Range   Glucose 96 70 - 99 mg/dL   BUN 10 6 - 24 mg/dL   Creatinine, Ser 8.87 0.76 - 1.27 mg/dL   eGFR 83 >40 fO/fpw/8.26   BUN/Creatinine Ratio 9 9 - 20   Sodium 139 134 - 144 mmol/L   Potassium 4.0 3.5 - 5.2 mmol/L   Chloride 101 96 - 106 mmol/L   CO2 22 20 - 29 mmol/L   Calcium  9.5 8.7 - 10.2 mg/dL   Total Protein 7.2 6.0 - 8.5 g/dL   Albumin 4.2 4.1 - 5.1 g/dL    Globulin, Total 3.0 1.5 - 4.5 g/dL   Bilirubin Total 0.6 0.0 - 1.2 mg/dL   Alkaline Phosphatase 66 47 - 123 IU/L   AST 26 0 - 40 IU/L   ALT 27 0 - 44 IU/L  Hemoglobin A1c  Result Value Ref Range   Hgb A1c MFr Bld 6.4 (H) 4.8 - 5.6 %   Est. average glucose Bld gHb Est-mCnc 137 mg/dL  Lipid panel  Result Value Ref Range   Cholesterol, Total 178 100 - 199 mg/dL  Triglycerides 93 0 - 149 mg/dL   HDL 46 >60 mg/dL   VLDL Cholesterol Cal 17 5 - 40 mg/dL   LDL Chol Calc (NIH) 884 (H) 0 - 99 mg/dL   Chol/HDL Ratio 3.9 0.0 - 5.0 ratio  VITAMIN D  25 Hydroxy (Vit-D Deficiency, Fractures)  Result Value Ref Range   Vit D, 25-Hydroxy 19.5 (L) 30.0 - 100.0 ng/mL    Assessment & Plan    Acute upper respiratory infection  Right foot pain  Type 2 diabetes mellitus with other specified complication, without long-term current use of insulin (HCC) -     Tirzepatide ; Inject 7.5 mg into the skin once a week.  Dispense: 2 mL; Refill: 0 -     Tirzepatide ; Inject 10 mg into the skin once a week.  Dispense: 2 mL; Refill: 3 -     Losartan  Potassium; Take 1 tablet (25 mg total) by mouth daily.  Dispense: 30 tablet; Refill: 1 -     Microalbumin / creatinine urine ratio -     Comprehensive metabolic panel with GFR -     Hemoglobin A1c -     Lipid panel  Hypertension associated with type 2 diabetes mellitus (HCC)  Morbid obesity with body mass index (BMI) of 50.0 to 59.9 in adult [facility])  Chronic gout of ankle, unspecified cause, unspecified laterality -     Allopurinol ; Take 1 tablet (100 mg total) by mouth daily.  Dispense: 90 tablet; Refill: 1  Vitamin D  deficiency -     VITAMIN D  25 Hydroxy (Vit-D Deficiency, Fractures)  Severe obstructive sleep apnea -     For home use only DME continuous positive airway pressure (CPAP)      Acute upper respiratory tract infection Patient endorses productive cough starting at the end of the prior week and continuing over the weekend.  Symptoms seem to be  improving.  Continue supportive measures.  Work note provided.  Patient to follow-up if he does not continue to improve and/or symptoms do not resolve.  Right foot pain, improving Heel pain likely due to inappropriate footwear, improving with occasional soreness. Differential includes heel spurs.  Type 2 diabetes mellitus with other specified complication, without long-term current use of insulin Type 2 diabetes mellitus managed with Mounjaro . Blood glucose levels low, significant weight loss from 410 lbs to 379 lbs. Regular exercise reported. - Send prescription for Mounjaro  7.5 mg and 10 mg as a delayed fill. - Advise to continue exercise regimen.  Hypertension associated with type 2 diabetes mellitus Hypertension with current blood pressure 144/88 mmHg. Not taking losartan  due to running out. Previously well-controlled on losartan . - Send prescription for losartan . - Advise to continue monitoring blood pressure weekly at [facility].  Morbid obesity with body mass index (BMI) of 50.0-59.9 Obesity with significant weight loss from 410 lbs to 379 lbs through exercise and dietary changes. Reports increased energy. - Encourage continued weight loss efforts through exercise and healthy eating.  Chronic gout of ankle, unspecified cause, unspecified laterality Gout managed with allopurinol , low on medication. - Send prescription for [facility]opurinol  to [facility].    Return in about 3 months (around 01/20/2024) for CPE, DM, HTN.      I discussed the assessment and treatment plan with the patient  The patient was provided an opportunity to ask questions and all were answered. The patient agreed with the plan and demonstrated an understanding of the instructions.   The patient was advised to call back or seek an in-person evaluation if the  symptoms worsen or if the condition fails to improve as anticipated.    LAURAINE LOISE BUOY, DO  [facility] [phone]  (phone) [phone] (fax)  [facility]

## 2023-10-23 LAB — COMPREHENSIVE METABOLIC PANEL WITH GFR
ALT: 27 IU/L (ref 0–44)
AST: 26 IU/L (ref 0–40)
Albumin: 4.2 g/dL (ref 4.1–5.1)
Alkaline Phosphatase: 66 IU/L (ref 47–123)
BUN/Creatinine Ratio: 9 (ref 9–20)
BUN: 10 mg/dL (ref 6–24)
Bilirubin Total: 0.6 mg/dL (ref 0.0–1.2)
CO2: 22 mmol/L (ref 20–29)
Calcium: 9.5 mg/dL (ref 8.7–10.2)
Chloride: 101 mmol/L (ref 96–106)
Creatinine, Ser: 1.12 mg/dL (ref 0.76–1.27)
Globulin, Total: 3 g/dL (ref 1.5–4.5)
Glucose: 96 mg/dL (ref 70–99)
Potassium: 4 mmol/L (ref 3.5–5.2)
Sodium: 139 mmol/L (ref 134–144)
Total Protein: 7.2 g/dL (ref 6.0–8.5)
eGFR: 83 mL/min/1.73 (ref >59)

## 2023-10-23 LAB — LIPID PANEL
Chol/HDL Ratio: 3.9 ratio (ref 0.0–5.0)
Cholesterol, Total: 178 mg/dL (ref 100–199)
HDL: 46 mg/dL (ref >39)
LDL Chol Calc (NIH): 115 mg/dL — ABNORMAL HIGH (ref 0–99)
Triglycerides: 93 mg/dL (ref 0–149)
VLDL Cholesterol Cal: 17 mg/dL (ref 5–40)

## 2023-10-23 LAB — HEMOGLOBIN A1C
Est. average glucose Bld gHb Est-mCnc: 137 mg/dL
Hgb A1c MFr Bld: 6.4 % — ABNORMAL HIGH (ref 4.8–5.6)

## 2023-10-23 LAB — VITAMIN D 25 HYDROXY (VIT D DEFICIENCY, FRACTURES): Vit D, 25-Hydroxy: 19.5 ng/mL — ABNORMAL LOW (ref 30.0–100.0)

## 2023-10-23 LAB — MICROALBUMIN / CREATININE URINE RATIO
Creatinine, Urine: 55.2 mg/dL
Microalb/Creat Ratio: <5 mg/g{creat} (ref 0–29)
Microalbumin, Urine: <3 ug/mL

## 2023-10-24 ENCOUNTER — Ambulatory Visit: Payer: Self-pay

## 2023-10-24 NOTE — Telephone Encounter (Signed)
 FYI Only or Action Required?: FYI only for provider.  Patient was last seen in primary care on 10/21/2023 by Donzella Lauraine SAILOR, DO.  Called Nurse Triage reporting Cough.  Symptoms began a week ago.  Interventions attempted: OTC medications: Theraflu and Prescription medications: Promethazine-DM.  Symptoms are: unchanged.  Triage Disposition: See Physician Within 24 Hours  Patient/caregiver understands and will follow disposition?: Yes  Copied from CRM #[id number]. Topic: Clinical - Red Word Triage >> Oct 24, 2023  4:50 PM Teressa P wrote: Red Word that prompted transfer to Nurse Triage: coughing.  was in Monday.  Was given a cough medication and it is not helping  Reason for Disposition  [1] Continuous (nonstop) coughing interferes with work or school AND [2] no improvement using cough treatment per Care Advice  Answer Assessment - Initial Assessment Questions New onset cough 1 week ago, saw PCP on Monday with recs to continue theraflu. Pt also reports taking phenergan for cough, ineffective and reports cough not improving. Asking to see PCP and have different rx prescribed for cough. Scheduled with PCP tomorrow.  1. ONSET: When did the cough begin?      1 week ago 2. SEVERITY: How bad is the cough today?      7/10 3. SPUTUM: Describe the color of your sputum (e.g., none, dry cough; clear, white, yellow, green)     Yellow, small 4. HEMOPTYSIS: Are you coughing up any blood? If Yes, ask: How much? (e.g., flecks, streaks, tablespoons, etc.)     No 5. DIFFICULTY BREATHING: Are you having difficulty breathing? If Yes, ask: How bad is it? (e.g., mild, moderate, severe)      No  6. FEVER: Do you have a fever? If Yes, ask: What is your temperature, how was it measured, and when did it start?     No 7. CARDIAC HISTORY: Do you have any history of heart disease? (e.g., heart attack, congestive heart failure)      No 8. LUNG HISTORY: Do you have any history of lung  disease?  (e.g., pulmonary embolus, asthma, emphysema)     No 9. PE RISK FACTORS: Do you have a history of blood clots? (or: recent major surgery, recent prolonged travel, bedridden)     No 10. OTHER SYMPTOMS: Do you have any other symptoms? (e.g., runny nose, wheezing, chest pain)       No 12. TRAVEL: Have you traveled out of the country in the last month? (e.g., travel history, exposures)       No  Protocols used: Cough - Acute Productive-A-AH

## 2023-10-25 ENCOUNTER — Ambulatory Visit: Admitting: Family Medicine

## 2023-10-25 ENCOUNTER — Encounter: Payer: Self-pay | Admitting: Family Medicine

## 2023-10-25 VITALS — BP 147/78 | HR 82 | Wt 373.0 lb

## 2023-10-25 DIAGNOSIS — R051 Acute cough: Secondary | ICD-10-CM | POA: Diagnosis not present

## 2023-10-25 MED ORDER — CETIRIZINE HCL 10 MG PO TABS: 10.0000 mg | ORAL_TABLET | Freq: Every day | ORAL | 11 refills | Status: AC

## 2023-10-25 MED ORDER — BENZONATATE 100 MG PO CAPS: 100.0000 mg | ORAL_CAPSULE | Freq: Two times a day (BID) | ORAL | 0 refills | Status: DC | PRN

## 2023-10-25 MED ORDER — AEROCHAMBER HOLDING CHAMBER DEVI: 1.0000 | Freq: Every day | 0 refills | Status: AC

## 2023-10-25 MED ORDER — ALBUTEROL SULFATE HFA 108 (90 BASE) MCG/ACT IN AERS: 2.0000 | INHALATION_SPRAY | Freq: Four times a day (QID) | RESPIRATORY_TRACT | 2 refills | Status: AC | PRN

## 2023-10-25 NOTE — Patient Instructions (Addendum)
 Could also try an aerochamber/spacer device for the albuterol .  Let me know via MyChart if your symptoms are worsening/becoming productive, and we can consider an x-ray.

## 2023-10-25 NOTE — Progress Notes (Signed)
 Established patient visit   Patient: Leonard Pierce   DOB: [birth year]   43 y.o. Male  MRN: [id number] Visit Date: 10/25/2023  Today's healthcare provider: LAURAINE LOISE BUOY, DO   Chief Complaint  Patient presents with   Cough    Patient was seen Monday 09/22 for same symptoms.   Subjective    Cough  Leonard Pierce is a 43 year old male who presents with a worsening cough.  He has experienced a worsening cough for about a week and a half, severe enough to interfere with his work, causing him to leave early due to its intensity. The cough is worse in the morning and late at night, and is accompanied by some phlegm, though not significant. No fever, chills, or shortness of breath are present. He denies ear pain, pressure, or pain in the ears.  He has been using promethazine for the cough without relief and has not used an albuterol  inhaler before. He notes that being rained on while working a part-time job may have exacerbated his symptoms. He denies any history of allergies, although his family has a history of allergies. No chest pain or significant shortness of breath is reported. He has no issues with appetite, nausea, vomiting, or abdominal pain.  His weight is currently 373 pounds, down from 401 pounds, indicating a weight loss of approximately 28 pounds. He attributes this to the use of Mounjaro , which affects his appetite. He has been on this medication since October of the previous year, although he experienced issues with insurance coverage and preauthorization initially, as well as an additional brief episode in the last few months.       Medications: Outpatient Medications Prior to Visit  Medication Sig   allopurinol  (ZYLOPRIM ) 100 MG tablet Take 1 tablet (100 mg total) by mouth daily.   Blood Glucose Monitoring Suppl DEVI 1 each by Does not apply route daily before breakfast. May substitute to any manufacturer covered by patient's insurance.   ciclopirox  (PENLAC ) 8 %  solution Apply topically at bedtime. Apply over nail and surrounding skin. Apply daily over previous coat. After seven (7) days, may remove with alcohol and continue cycle.   losartan  (COZAAR ) 25 MG tablet Take 1 tablet (25 mg total) by mouth daily.   rosuvastatin  (CRESTOR ) 5 MG tablet Take 1 tablet (5 mg total) by mouth daily.   colchicine  0.6 MG tablet Take 1 tablet (0.6 mg total) by mouth 2 (two) times daily.   Glucose Blood (BLOOD GLUCOSE TEST STRIPS) STRP 1 each by In Vitro route daily before breakfast. May substitute to any manufacturer covered by patient's insurance.   indomethacin  (INDOCIN ) 25 MG capsule Take 1 capsule (25 mg total) by mouth 3 (three) times daily with meals.   Lancet Device MISC 1 each by Does not apply route daily before breakfast. May substitute to any manufacturer covered by patient's insurance.   Lancets Misc. MISC 1 each by Does not apply route daily before breakfast. May substitute to any manufacturer covered by patient's insurance.   [START ON 11/17/2023] tirzepatide  (MOUNJARO ) 10 MG/0.5ML Pen Inject 10 mg into the skin once a week.   tirzepatide  (MOUNJARO ) 7.5 MG/0.5ML Pen Inject 7.5 mg into the skin once a week.   Vitamin D , Ergocalciferol , (DRISDOL ) 1.25 MG (50000 UNIT) CAPS capsule Take 1 capsule (50,000 Units total) by mouth every 7 (seven) days.   No facility-administered medications prior to visit.        Objective  BP (!) 147/78 (BP Location: Right Arm, Patient Position: Sitting, Cuff Size: Large) Comment: pt ill  Pulse 82   Wt (!) 373 lb (169.2 kg)   SpO2 100%   BMI 53.52 kg/m     Physical Exam Vitals reviewed.  Constitutional:      General: He is not in acute distress.    Appearance: Normal appearance. He is not diaphoretic.  HENT:     Head: Normocephalic and atraumatic.  Eyes:     General: No scleral icterus.    Conjunctiva/sclera: Conjunctivae normal.  Cardiovascular:     Rate and Rhythm: Normal rate and regular rhythm.     Pulses:  Normal pulses.     Heart sounds: Normal heart sounds. No murmur heard. Pulmonary:     Effort: Pulmonary effort is normal. No respiratory distress.     Breath sounds: Normal breath sounds. Decreased air movement present. No wheezing or rhonchi.  Musculoskeletal:     Cervical back: Neck supple.     Right lower leg: No edema.     Left lower leg: No edema.  Lymphadenopathy:     Cervical: No cervical adenopathy.  Skin:    General: Skin is warm and dry.     Findings: No rash.  Neurological:     Mental Status: He is alert and oriented to person, place, and time. Mental status is at baseline.  Psychiatric:        Mood and Affect: Mood normal.        Behavior: Behavior normal.      No results found for any visits on 10/25/23.  Assessment & Plan    Acute cough -     Albuterol  Sulfate HFA; Inhale 2 puffs into the lungs every 6 (six) hours as needed for wheezing or shortness of breath.  Dispense: 8 g; Refill: 2 -     Cetirizine  HCl; Take 1 tablet (10 mg total) by mouth daily.  Dispense: 30 tablet; Refill: 11 -     Benzonatate ; Take 1 capsule (100 mg total) by mouth 2 (two) times daily as needed for cough.  Dispense: 20 capsule; Refill: 0 -     AeroChamber [employer]; 1 each by Does not apply route daily. To use with albuterol  inhaler.  Dispense: 1 each; Refill: 0    Acute cough  Acute cough  likely due to allergic rhinitis vs allergic asthma.  Cough present for the past week and a half. No fever, chills, or significant phlegm. Family history positive for allergies. Examination suggests allergies. - Prescribed albuterol  inhaler with aero chamber. - Prescribed benzonatate  for cough control. - Prescribed allergy medication for 2-4 weeks. - Advised to monitor symptoms and report worsening, especially shortness of breath. - Plan for chest x-ray if symptoms worsen.     Return if symptoms worsen or fail to improve.      I discussed the assessment and treatment plan with the patient   The patient was provided an opportunity to ask questions and all were answered. The patient agreed with the plan and demonstrated an understanding of the instructions.   The patient was advised to call back or seek an in-person evaluation if the symptoms worsen or if the condition fails to improve as anticipated.    LAURAINE LOISE BUOY, DO  [facility] [phone] (phone) [phone] (fax)  [facility]

## 2023-11-01 ENCOUNTER — Ambulatory Visit: Admitting: Family Medicine

## 2023-11-04 ENCOUNTER — Ambulatory Visit: Payer: Self-pay | Admitting: Family Medicine

## 2023-11-04 DIAGNOSIS — E559 Vitamin D deficiency, unspecified: Secondary | ICD-10-CM

## 2023-11-04 MED ORDER — VITAMIN D (ERGOCALCIFEROL) 1.25 MG (50000 UNIT) PO CAPS: 50000.0000 [IU] | ORAL_CAPSULE | ORAL | 1 refills | Status: AC

## 2023-11-05 NOTE — Progress Notes (Signed)
 Seen by patient Leonard Pierce on 11/04/2023  4:10 PM

## 2023-11-21 ENCOUNTER — Other Ambulatory Visit: Payer: Self-pay | Admitting: Family Medicine

## 2023-11-21 DIAGNOSIS — E1169 Type 2 diabetes mellitus with other specified complication: Secondary | ICD-10-CM

## 2023-11-21 DIAGNOSIS — E559 Vitamin D deficiency, unspecified: Secondary | ICD-10-CM

## 2023-11-21 NOTE — Telephone Encounter (Signed)
 Copied from CRM [id number]. Topic: Clinical - Medication Refill >> Nov 21, 2023  8:21 AM Emylou G wrote: Medication: tirzepatide  (MOUNJARO ) 10 MG/0.5ML Pen Vitamin D , Ergocalciferol , (DRISDOL ) 1.25 MG (50000 UNIT) CAPS capsule  Has the patient contacted their pharmacy? Yes (Agent: If no, request that the patient contact the pharmacy for the refill. If patient does not wish to contact the pharmacy document the reason why and proceed with request.) (Agent: If yes, when and what did the pharmacy advise?) said to call us   This is the patient's preferred pharmacy:  [facility] [address] [facility] - [place], [place] - [address] [facility] [address] [place] [address] Phone: [phone] Fax: [phone]  Is this the correct pharmacy for this prescription? Yes If no, delete pharmacy and type the correct one.   Has the prescription been filled recently? No  Is the patient out of the medication? Yes  Has the patient been seen for an appointment in the last year OR does the patient have an upcoming appointment? Yes  Can we respond through MyChart? Yes  Agent: Please be advised that Rx refills may take up to 3 business days. We ask that you follow-up with your pharmacy.

## 2023-11-22 NOTE — Telephone Encounter (Signed)
 Requested medication (s) are due for refill today: yes  Requested medication (s) are on the active medication list: yes  Last refill:    Future visit scheduled: yes  Notes to clinic:  Medication not assigned to a protocol, review manually.      Requested Prescriptions  Pending Prescriptions Disp Refills   Vitamin D , Ergocalciferol , (DRISDOL ) 1.25 MG (50000 UNIT) CAPS capsule 12 capsule 1    Sig: Take 1 capsule (50,000 Units total) by mouth every 7 (seven) days.     Endocrinology:  Vitamins - Vitamin D  Supplementation 2 Failed - 11/22/2023  1:53 PM      Failed - Manual Review: Route requests for 50,000 IU strength to the provider      Failed - Vitamin D  in normal range and within 360 days    Vit D, 25-Hydroxy  Date Value Ref Range Status  10/22/2023 19.5 (L) 30.0 - 100.0 ng/mL Final    Comment:    Vitamin D  deficiency has been defined by the Institute of Medicine and an Endocrine Society practice guideline as a level of serum 25-OH vitamin D  less than 20 ng/mL (1,2). The Endocrine Society went on to further define vitamin D  insufficiency as a level between 21 and 29 ng/mL (2). 1. IOM (Institute of Medicine). 2010. Dietary reference    intakes for calcium  and D. [place]: The    [employer]. 2. Holick MF, Binkley Mineral, Bischoff-Ferrari HA, et al.    Evaluation, treatment, and prevention of vitamin D     deficiency: an Endocrine Society clinical practice    guideline. JCEM. 2011 Jul; 96(7):1911-30.          Passed - Ca in normal range and within 360 days    Calcium   Date Value Ref Range Status  10/22/2023 9.5 8.7 - 10.2 mg/dL Final   Calcium , Ion  Date Value Ref Range Status  05/09/2012 1.20 1.12 - 1.23 mmol/L Final         Passed - Valid encounter within last 12 months    Recent Outpatient Visits           4 weeks ago Acute cough   [place] [facility] [place], Lauraine SAILOR, DO   1 month ago Acute upper respiratory infection   [facility] [place], Lauraine SAILOR, DO   5 months ago Type 2 diabetes mellitus with other specified complication, without long-term current use of insulin [facility])   [place] [facility] [place], Lauraine SAILOR, DO   8 months ago Type 2 diabetes mellitus with stage 2 chronic kidney disease, without long-term current use of insulin (HCC)   [facility] [place], Lauraine SAILOR, DO               tirzepatide  (MOUNJARO ) 10 MG/0.5ML Pen 2 mL 3    Sig: Inject 10 mg into the skin once a week.     Off-Protocol Failed - 11/22/2023  1:53 PM      Failed - Medication not assigned to a protocol, review manually.      Passed - Valid encounter within last 12 months    Recent Outpatient Visits           4 weeks ago Acute cough   [place] [facility] [place], Lauraine SAILOR, DO   1 month ago Acute upper respiratory infection   [facility] [place], Lauraine SAILOR, DO   5 months ago Type 2 diabetes mellitus  with other specified complication, without long-term current use of insulin [facility])   [place] [facility] [place], Lauraine SAILOR, DO   8 months ago Type 2 diabetes mellitus with stage 2 chronic kidney disease, without long-term current use of insulin [facility])   [facility] [place], Lauraine SAILOR, [place]

## 2023-11-25 NOTE — Telephone Encounter (Signed)
 LOV- 10/21/2023 NOV- 01/27/2024 LRF- 11/17/2023 tirzepatide  (MOUNJARO ) 10 MG/0.5ML Pen [[id number]]   Order Details Dose: 10 mg Route: Subcutaneous Frequency: Weekly  Dispense Quantity: 2 mL (28 day supply) Refills: 3   Duration: -- Dispense As Written: No   Note to Pharmacy: DELAYED FILL       Sig: Inject 10 mg into the skin once a week.       Start Date: 11/17/23 End Date: --  Written Date: 10/21/23 Expiration Date: 10/20/24   11/04/2023

## 2023-12-17 ENCOUNTER — Telehealth: Payer: Self-pay

## 2023-12-17 NOTE — Telephone Encounter (Signed)
 Copied from CRM #[id number]. Topic: General - Other >> Dec 16, 2023  4:35 PM [name] wrote: Reason for CRM: Patient called in, states its time for his 3rd shot of mounjaro ,  and and he wants to know can he go upon the dose or does he have to make an appt first

## 2023-12-24 NOTE — Telephone Encounter (Signed)
 Called and informed patient of the provider's message that he has 3 refills on his current dosage of medication.  Patient verbalized understanding.

## 2024-01-02 ENCOUNTER — Ambulatory Visit: Payer: Self-pay

## 2024-01-02 NOTE — Telephone Encounter (Signed)
 Called patient and advised him of the provider's message to go to urgent care that his symptoms did not sound like a GOUT attack, stated that it could be some kind of infection.  Patient verbalized understanding.

## 2024-01-02 NOTE — Telephone Encounter (Signed)
 FYI Only or Action Required?: FYI only for provider: ED advised. Pt states he will wait to see if his medication helps and then go if he needs to.   Patient was last seen in primary care on 10/25/2023 by Donzella Lauraine SAILOR, DO.  Called Nurse Triage reporting Gout.  Symptoms began yesterday.  Interventions attempted: Prescription medications: Alopurinol.  Symptoms are: gradually worsening.  Triage Disposition: Go to ED Now (Notify PCP)  Patient/caregiver understands and will follow disposition?: No, refuses disposition  Reason for Disposition  SEVERE pain (e.g., excruciating)  Answer Assessment - Initial Assessment Questions Patient calls in stating he started to have gout pain yesterday that has worsened today. He was at work and had to leave d/t 9/10 pain with redness to right ankle and swelling in the right foot. He states he has taken alopurinol this morning with no relief. ED advised. Patient requests to see his PCP instead, but then states he is going to wait to see if the medication helps and if not he will go.   1. SYMPTOM: What's the main symptom you're concerned about? (e.g., rash, sore, callus, drainage, numbness)     Throbbing (gout) pain in R foot/ankle  2. LOCATION: Where is the  pain located? (e.g., foot/toe, top/bottom, left/right)    Right Ankle/foot  3. APPEARANCE: What does the area look like? (e.g., normal, red, swollen; size)     Redness to ankle; swelling in the foot   4. ONSET: When did the  pain  start?     Yesterday   5. PAIN: Is there any pain? If Yes, ask: How bad is it? (Scale: 0-10; none, mild, moderate, severe)     Yes, 9/10 currently-patient states he took his alopurinol this morning with no relief; difficulty walking   6. CAUSE: What do you think is causing the symptoms?     Gout  7. BLOOD GLUCOSE: What is your blood glucose level?      Has not checked   8. USUAL RANGE: What is your blood glucose level usually? (e.g., usual  fasting morning value, usual evening value)     Unsure  9. OTHER SYMPTOMS: Do you have any other symptoms? (e.g., fever, weakness)     No  10. PREGNANCY: Is there any chance you are pregnant? When was your last menstrual period?       NA  Protocols used: Diabetes - Foot Problems and Questions-A-AH  Copied from CRM #[id number]. Topic: Clinical - Red Word Triage >> Jan 02, 2024 10:36 AM Montie POUR wrote: Red Word that prompted transfer to Nurse Triage:  He had a real bad flare up of gout in right foot and ankle; pain level 9-10

## 2024-01-02 NOTE — Telephone Encounter (Signed)
 Doesn't sound like gout, could be infection. He should go to urgent care. Allopurinol  is not for active gout attacks, its to prevent them from occurring in the first place

## 2024-01-03 NOTE — Telephone Encounter (Deleted)
 Copied from CRM [id number]. Topic: General - Other >> Jan 03, 2024 11:10 AM Amy B wrote: Reason for CRM: Patient is requesting an excuse from work due to a gout flareup.  Please call [phone] (Mobile) with any questions.

## 2024-01-08 ENCOUNTER — Telehealth: Payer: Self-pay

## 2024-01-08 NOTE — Telephone Encounter (Signed)
-----   Message from Isaiah DELENA Pepper sent at 01/03/2024 12:32 PM EST ----- I received a message that this patient needs a note for work. I did not take care of this patient, I believe he went to the ED or urgent care. I recommend that he get a work excuse from his urgent care/ED provider.

## 2024-01-08 NOTE — Telephone Encounter (Signed)
 Called pt no answer. Attempted to lvm but line was disconnected.

## 2024-01-27 ENCOUNTER — Ambulatory Visit: Admitting: Family Medicine

## 2024-01-27 ENCOUNTER — Encounter: Payer: Self-pay | Admitting: Family Medicine

## 2024-01-27 VITALS — BP 119/79 | HR 83 | Temp 98.0°F | Ht 70.0 in | Wt 353.7 lb

## 2024-01-27 DIAGNOSIS — Z0001 Encounter for general adult medical examination with abnormal findings: Secondary | ICD-10-CM

## 2024-01-27 DIAGNOSIS — E1159 Type 2 diabetes mellitus with other circulatory complications: Secondary | ICD-10-CM | POA: Diagnosis not present

## 2024-01-27 DIAGNOSIS — E7800 Pure hypercholesterolemia, unspecified: Secondary | ICD-10-CM

## 2024-01-27 DIAGNOSIS — I152 Hypertension secondary to endocrine disorders: Secondary | ICD-10-CM | POA: Diagnosis not present

## 2024-01-27 DIAGNOSIS — R5382 Chronic fatigue, unspecified: Secondary | ICD-10-CM | POA: Diagnosis not present

## 2024-01-27 DIAGNOSIS — N182 Chronic kidney disease, stage 2 (mild): Secondary | ICD-10-CM

## 2024-01-27 DIAGNOSIS — E1122 Type 2 diabetes mellitus with diabetic chronic kidney disease: Secondary | ICD-10-CM | POA: Diagnosis not present

## 2024-01-27 DIAGNOSIS — Z7985 Long-term (current) use of injectable non-insulin antidiabetic drugs: Secondary | ICD-10-CM

## 2024-01-27 DIAGNOSIS — Z0000 Encounter for general adult medical examination without abnormal findings: Secondary | ICD-10-CM

## 2024-01-27 LAB — POCT GLYCOSYLATED HEMOGLOBIN (HGB A1C): Hemoglobin A1C: 5.7 % — AB (ref 4.0–5.6)

## 2024-01-27 MED ORDER — TIRZEPATIDE 12.5 MG/0.5ML ~~LOC~~ SOAJ: 12.5000 mg | SUBCUTANEOUS | 1 refills | Status: AC

## 2024-01-27 NOTE — Progress Notes (Signed)
 "    Complete physical exam   Patient: Leonard Pierce   DOB: 1980-09-04   43 y.o. Male  MRN: [id number] Visit Date: 01/27/2024  Today's healthcare provider: LAURAINE LOISE BUOY, DO   Chief Complaint  Patient presents with   Annual Exam    Diet- General, just fish, turkey, and chicken no beef Exercise- Lift weights, cardio Overall feeling- Pretty good Sleep- Good about 6 hours a night Concerns- Vitamin D  and concerned about B12, wants to increase injections  [facility] will request results   Subjective    Leonard Pierce is a 43 y.o. male who presents today for a complete physical exam.   HPI HPI     Annual Exam    Additional comments: Diet- General, just fish, turkey, and chicken no beef Exercise- Lift weights, cardio Overall feeling- Pretty good Sleep- Good about 6 hours a night Concerns- Vitamin D  and concerned about B12, wants to increase injections  [facility] will request results      Last edited by Terrel Powell CROME, CMA on 01/27/2024  4:15 PM.       Leonard Pierce is a 43 year old male with diabetes and hypertension who presents for follow-up of his diabetes management.  His blood sugar levels have been stable, with a recent A1c of 5.7%. He has been losing weight, which he attributes to his current medication regimen and increased physical activity, working out five days a week. No recent episodes of hypoglycemia. His girlfriend is checking his blood sugar levels regularly.  He is currently taking rosuvastatin  for cholesterol and allopurinol  for gout, along with vitamin D  supplementation once a week at a dose of 50,000 IU. He mentions a recent gout flare-up that occurred a couple of weeks ago, for which he was unable to see his regular provider.   He is considering having his B12 levels checked due to fatigue, although he has not previously been on metformin and has not had B12 levels checked before.  Socially, he mentions having a new girlfriend and spending  time with her daughter, who is 56 years old.     Past Medical History:  Diagnosis Date   Acute MI (HCC)    Reports MI at 18, seen at [facility]    Coronary artery disease    Diverticulitis 2017   Gout    Past Surgical History:  Procedure Laterality Date   COLONOSCOPY WITH PROPOFOL  N/A 12/06/2015   Procedure: COLONOSCOPY WITH PROPOFOL ;  Surgeon: Rogelia Copping, MD;  Location: [facility] ENDOSCOPY;  Service: Endoscopy;  Laterality: N/A;   NO PAST SURGERIES     per patient 10/13/15   TONSILLECTOMY     Social History   Socioeconomic History   Marital status: Single    Spouse name: Not on file   Number of children: Not on file   Years of education: Not on file   Highest education level: Not on file  Occupational History   Not on file  Tobacco Use   Smoking status: Never   Smokeless tobacco: Never  Vaping Use   Vaping status: Never Used  Substance and Sexual Activity   Alcohol use: Yes    Alcohol/week: 2.0 standard drinks of alcohol    Types: 2 Cans of beer per week   Drug use: No   Sexual activity: Not on file  Other Topics Concern   Not on file  Social History Narrative   Not on file   Social Drivers of Health   Tobacco  Use: Low Risk (01/27/2024)   Patient History    Smoking Tobacco Use: Never    Smokeless Tobacco Use: Never    Passive Exposure: Not on file  Financial Resource Strain: Low Risk (02/22/2022)   Received from [facility]   Overall Financial Resource Strain (CARDIA)    Difficulty of Paying Living Expenses: Not hard at all  Food Insecurity: No Food Insecurity (02/22/2022)   Received from [facility]   Epic    Within the past 12 months, you worried that your food would run out before you got the money to buy more.: Never true    Within the past 12 months, the food you bought just didn't last and you didn't have money to get more.: Never true  Transportation Needs: No Transportation Needs (02/22/2022)   Received from [facility] - Transportation     Lack of Transportation (Medical): No    Lack of Transportation (Non-Medical): No  Physical Activity: Not on file  Stress: No Stress Concern Present (02/22/2022)   Received from [facility] - Occupational Stress Questionnaire    Feeling of Stress : Not at all  Social Connections: Not on file  Intimate Partner Violence: Not At Risk (03/22/2022)   Received from [facility]   Epic    Within the last year, have you been afraid of your partner or ex-partner?: No    Within the last year, have you been humiliated or emotionally abused in other ways by your partner or ex-partner?: No    Within the last year, have you been kicked, hit, slapped, or otherwise physically hurt by your partner or ex-partner?: No    Within the last year, have you been raped or forced to have any kind of sexual activity by your partner or ex-partner?: No  Depression (PHQ2-9): Low Risk (10/21/2023)   Depression (PHQ2-9)    PHQ-2 Score: 2  Alcohol Screen: Low Risk (01/25/2023)   Alcohol Screen    Last Alcohol Screening Score (AUDIT): 0  Housing: Not on file  Utilities: Not on file  Health Literacy: Low Risk (03/22/2022)   Received from [facility]    How often do you need to have someone help you when you read instructions, pamphlets, or other written material from your doctor or pharmacy?: Never   Family Status  Relation Name Status   Mother  Alive   Father  Alive   MGM  (Not Specified)   MGF  Alive   PGM  (Not Specified)   PGF  (Not Specified)  No partnership data on file   Family History  Problem Relation Age of Onset   Non-Hodgkin's lymphoma Mother    Hypertension Mother    Diabetes Mother    Diabetes Father    Diabetes Maternal Grandmother    Heart disease Maternal Grandmother    Heart disease Maternal Grandfather    Diabetes Maternal Grandfather    Renal Disease Maternal Grandfather    Diabetes Paternal Grandmother    Heart  disease Paternal Grandmother    Renal Disease Paternal Grandmother    Diabetes Paternal Grandfather    Heart disease Paternal Grandfather 36   Renal Disease Paternal Grandfather    Allergies[1]  Patient Care Team: Siyah Mault, Lauraine SAILOR, DO as PCP - General (Family Medicine)   Medications: Show/hide medication list[2]  Review of Systems  Constitutional:  Negative for appetite change, chills, fatigue and  fever.  HENT:  Negative for congestion, ear pain, hearing loss, nosebleeds and trouble swallowing.   Eyes:  Negative for pain and visual disturbance.  Respiratory:  Negative for cough, chest tightness and shortness of breath.   Cardiovascular:  Negative for chest pain, palpitations and leg swelling.  Gastrointestinal:  Negative for abdominal pain, blood in stool, constipation, diarrhea, nausea and vomiting.  Endocrine: Negative for polydipsia, polyphagia and polyuria.  Genitourinary:  Negative for dysuria and flank pain.  Musculoskeletal:  Negative for arthralgias, back pain, joint swelling, myalgias and neck stiffness.  Skin:  Negative for color change, rash and wound.  Neurological:  Negative for dizziness, tremors, seizures, speech difficulty, weakness, light-headedness and headaches.  Psychiatric/Behavioral:  Negative for behavioral problems, confusion, decreased concentration, dysphoric mood and sleep disturbance. The patient is not nervous/anxious.   All other systems reviewed and are negative.     Objective    BP 119/79 (BP Location: Right Arm, Patient Position: Sitting, Cuff Size: Large)   Pulse 83   Temp 98 F (36.7 C) (Oral)   Ht 5' 10 (1.778 m)   Wt (!) 353 lb 11.2 oz (160.4 kg)   SpO2 100%   BMI 50.75 kg/m    Physical Exam Vitals and nursing note reviewed.  Constitutional:      General: He is awake.     Appearance: Normal appearance. He is morbidly obese.  HENT:     Head: Normocephalic and atraumatic.     Right Ear: Tympanic membrane, ear canal and external ear  normal.     Left Ear: Tympanic membrane, ear canal and external ear normal.     Nose: Nose normal.     Mouth/Throat:     Mouth: Mucous membranes are moist.     Pharynx: Oropharynx is clear. No oropharyngeal exudate or posterior oropharyngeal erythema.  Eyes:     General: No scleral icterus.    Extraocular Movements: Extraocular movements intact.     Conjunctiva/sclera: Conjunctivae normal.     Pupils: Pupils are equal, round, and reactive to light.  Neck:     Thyroid : No thyromegaly or thyroid  tenderness.  Cardiovascular:     Rate and Rhythm: Normal rate and regular rhythm.     Pulses: Normal pulses.     Heart sounds: Normal heart sounds.  Pulmonary:     Effort: Pulmonary effort is normal. No tachypnea, bradypnea or respiratory distress.     Breath sounds: Normal breath sounds. No stridor. No wheezing, rhonchi or rales.  Abdominal:     General: Bowel sounds are normal. There is no distension.     Palpations: Abdomen is soft. There is no mass.     Tenderness: There is no abdominal tenderness. There is no guarding.     Hernia: No hernia is present.  Musculoskeletal:     Cervical back: Normal range of motion and neck supple.     Right lower leg: No edema.     Left lower leg: No edema.  Lymphadenopathy:     Cervical: No cervical adenopathy.  Skin:    General: Skin is warm and dry.  Neurological:     Mental Status: He is alert and oriented to person, place, and time. Mental status is at baseline.  Psychiatric:        Mood and Affect: Mood normal.        Behavior: Behavior normal.      Last depression screening scores    10/21/2023    3:20 PM 06/03/2023    8:57  AM 03/08/2023    3:18 PM  PHQ 2/9 Scores  PHQ - 2 Score 1 3 0  PHQ- 9 Score 2  4       Data saved with a previous flowsheet row definition   Last fall risk screening    10/21/2023    3:20 PM  Fall Risk   Falls in the past year? 0  Number falls in past yr: 0  Injury with Fall? 0   Risk for fall due to : No Fall  Risks     Data saved with a previous flowsheet row definition   Last Audit-C alcohol use screening    01/25/2023   11:16 AM  Alcohol Use Disorder Test (AUDIT)  1. How often do you have a drink containing alcohol? 0  2. How many drinks containing alcohol do you have on a typical day when you are drinking? 0  3. How often do you have six or more drinks on one occasion? 0  AUDIT-C Score 0   A score of 3 or more in women, and 4 or more in men indicates increased risk for alcohol abuse, EXCEPT if all of the points are from question 1   Results for orders placed or performed in visit on 01/27/24  POCT HgB A1C  Result Value Ref Range   Hemoglobin A1C 5.7 (A) 4.0 - 5.6 %   HbA1c POC (<> result, manual entry)     HbA1c, POC (prediabetic range)     HbA1c, POC (controlled diabetic range)      Assessment & Plan    Routine Health Maintenance and Physical Exam  Exercise Activities and Dietary recommendations  Goals   None     Immunization History  Administered Date(s) Administered   Dtap, Unspecified 08/05/1978, 02/01/1979, 05/28/1979, 03/08/1983   Influenza,inj,Quad PF,6+ Mos 10/28/2013   MMR 08/28/1979   Polio, Unspecified 08/05/1978, 02/01/1979, 05/28/1979, 03/08/1983    Health Maintenance  Topic Date Due   OPHTHALMOLOGY EXAM  10/29/2023   FOOT EXAM  01/25/2024   Pneumococcal Vaccine (1 of 2 - PCV) 03/07/2024 (Originally 05/27/1997)   Influenza Vaccine  04/28/2024 (Originally 08/30/2023)   COVID-19 Vaccine (1 - 2025-26 season) 10/13/2024 (Originally 09/30/2023)   Hepatitis B Vaccines 19-59 Average Risk (1 of 3 - 19+ 3-dose series) 10/20/2024 (Originally 05/27/1997)   HPV VACCINES (1 - 3-dose SCDM series) 10/20/2024 (Originally 05/27/2005)   DTaP/Tdap/Td (5 - Tdap) 01/26/2025 (Originally 05/27/1989)   HEMOGLOBIN A1C  07/27/2024   Diabetic kidney evaluation - eGFR measurement  10/21/2024   Diabetic kidney evaluation - Urine ACR  10/21/2024   Colonoscopy  12/05/2025   Hepatitis C  Screening  Completed   HIV Screening  Completed   Meningococcal B Vaccine  Aged Out    Discussed health benefits of physical activity, and encouraged him to engage in regular exercise appropriate for his age and condition.   Annual physical exam  Type 2 diabetes mellitus with stage 2 chronic kidney disease, without long-term current use of insulin (HCC) -     POCT glycosylated hemoglobin (Hb A1C) -     Tirzepatide ; Inject 12.5 mg into the skin once a week.  Dispense: 6 mL; Refill: 1  Hypertension associated with type 2 diabetes mellitus (HCC) Assessment & Plan: Chronic, stable on losartan  25 mg daily. No changes today.   Chronic fatigue -     Vitamin B12  Elevated LDL cholesterol level -     Lipid panel     Annual physical exam Physical exam  overall unremarkable except as noted above. Routine lab work ordered as noted. Due for tetanus vaccine, no recent vaccination. - Will administer tetanus vaccine in March, per patient preference.  Type 2 diabetes mellitus with stage 2 chronic kidney disease A1c at 5.7 today indicates improved glycemic control. Weight loss of 20 pounds aids blood sugar management. Low hypoglycemia risk with current medication (Mounjaro ). Concern for increased blood sugar if medication stopped. - Increase Mounjaro  to 12.5 mg weekly. - Monitor blood sugar levels regularly. - Encouraged continued weight management and exercise. - Requesting records for patient's most recent eye exam.  Gout Condition well-managed with allopurinol .  No changes today. - Continue allopurinol  100 mg daily.  Chronic fatigue Reports fatigue. Vitamin D  deficiency noted, taking supplements. B12 levels untested. - Ordered B12 level test for Wednesday (when patient can return for fasting labs). - Continue vitamin D  supplementation.  Elevated LDL cholesterol level Chronic, stable.  Currently managed with rosuvastatin  5 mg daily.  Will recheck level today.  LDL goal <70 due to  concomitant diabetes.    Return in about 3 months (around 04/26/2024) for DM, HTN, Weight and in 6 months for DM/weight w/next provider.     I discussed the assessment and treatment plan with the patient  The patient was provided an opportunity to ask questions and all were answered. The patient agreed with the plan and demonstrated an understanding of the instructions.   The patient was advised to call back or seek an in-person evaluation if the symptoms worsen or if the condition fails to improve as anticipated.    LAURAINE LOISE BUOY, DO  [facility] [phone] (phone) [phone] (fax)  [facility]     [1] No Known Allergies [2]  Outpatient Medications Prior to Visit  Medication Sig   albuterol  (VENTOLIN  HFA) 108 (90 Base) MCG/ACT inhaler Inhale 2 puffs into the lungs every 6 (six) hours as needed for wheezing or shortness of breath.   allopurinol  (ZYLOPRIM ) 100 MG tablet Take 1 tablet (100 mg total) by mouth daily.   Blood Glucose Monitoring Suppl DEVI 1 each by Does not apply route daily before breakfast. May substitute to any manufacturer covered by patient's insurance.   cetirizine  (ZYRTEC ) 10 MG tablet Take 1 tablet (10 mg total) by mouth daily.   ciclopirox  (PENLAC ) 8 % solution Apply topically at bedtime. Apply over nail and surrounding skin. Apply daily over previous coat. After seven (7) days, may remove with alcohol and continue cycle.   colchicine  0.6 MG tablet Take 1 tablet (0.6 mg total) by mouth 2 (two) times daily.   Glucose Blood (BLOOD GLUCOSE TEST STRIPS) STRP 1 each by In Vitro route daily before breakfast. May substitute to any manufacturer covered by patient's insurance.   indomethacin  (INDOCIN ) 25 MG capsule Take 1 capsule (25 mg total) by mouth 3 (three) times daily with meals.   Lancet Device MISC 1 each by Does not apply route daily before breakfast. May substitute to any manufacturer covered by patient's insurance.    Lancets Misc. MISC 1 each by Does not apply route daily before breakfast. May substitute to any manufacturer covered by patient's insurance.   losartan  (COZAAR ) 25 MG tablet Take 1 tablet (25 mg total) by mouth daily.   rosuvastatin  (CRESTOR ) 5 MG tablet Take 1 tablet (5 mg total) by mouth daily.   Spacer/Aero-Holding Chambers (AEROCHAMBER HOLDING CHAMBER) DEVI 1 each by Does not apply route daily. To use with albuterol  inhaler.   Vitamin D ,  Ergocalciferol , (DRISDOL ) 1.25 MG (50000 UNIT) CAPS capsule Take 1 capsule (50,000 Units total) by mouth every 7 (seven) days.   [DISCONTINUED] benzonatate  (TESSALON ) 100 MG capsule Take 1 capsule (100 mg total) by mouth 2 (two) times daily as needed for cough.   [DISCONTINUED] tirzepatide  (MOUNJARO ) 10 MG/0.5ML Pen Inject 10 mg into the skin once a week.   [DISCONTINUED] tirzepatide  (MOUNJARO ) 7.5 MG/0.5ML Pen Inject 7.5 mg into the skin once a week.   No facility-administered medications prior to visit.   "

## 2024-01-27 NOTE — Assessment & Plan Note (Signed)
 Chronic, stable on losartan  25 mg daily. No changes today.

## 2024-01-28 ENCOUNTER — Telehealth: Payer: Self-pay

## 2024-01-28 ENCOUNTER — Other Ambulatory Visit (HOSPITAL_COMMUNITY): Payer: Self-pay

## 2024-01-28 NOTE — Telephone Encounter (Signed)
 Pharmacy Patient Advocate Encounter   Received notification from Onbase that prior authorization for Mounjaro  12.5MG /0.5ML auto-injectors is required/requested.   Insurance verification completed.   The patient is insured through [employer].   Per test claim: PA required; PA submitted to above mentioned insurance via Latent Key/confirmation #/EOC [id number] Status is pending

## 2024-01-31 ENCOUNTER — Other Ambulatory Visit (HOSPITAL_COMMUNITY): Payer: Self-pay

## 2024-01-31 NOTE — Telephone Encounter (Signed)
 Pharmacy Patient Advocate Encounter  Received notification from [facility] [employer] that Prior Authorization for Mounjaro  12.5MG /0.5ML auto-injectors  has been APPROVED from 12/30/23 to 01/29/25. Unable to obtain price due to refill too soon rejection, last fill date 01/27/24 next available fill date1/18/26   PA #/Case ID/Reference #: [id number]

## 2024-02-03 ENCOUNTER — Encounter: Payer: Self-pay | Admitting: Family Medicine

## 2024-02-03 ENCOUNTER — Telehealth: Payer: Self-pay

## 2024-02-03 NOTE — Telephone Encounter (Signed)
 Patient advised that he needed to get his blood level checked before we could decide on the B12 shots.     Copied from CRM [id number]. Topic: Clinical - Medication Question >> Feb 03, 2024 12:06 PM Rea C wrote: Reason for CRM: Patient wants to know if it is okay to come in and take his B12 shot today?   [phone] (M)
